# Patient Record
Sex: Female | Born: 1955
Health system: Southern US, Community
[De-identification: ages and names within clinical notes are randomized; demographics above are authoritative.]

## PROBLEM LIST (undated history)

## (undated) DIAGNOSIS — F419 Anxiety disorder, unspecified: Secondary | ICD-10-CM

## (undated) DIAGNOSIS — G8929 Other chronic pain: Secondary | ICD-10-CM

## (undated) DIAGNOSIS — I471 Supraventricular tachycardia, unspecified: Secondary | ICD-10-CM

## (undated) DIAGNOSIS — E739 Lactose intolerance, unspecified: Secondary | ICD-10-CM

## (undated) DIAGNOSIS — M79602 Pain in left arm: Secondary | ICD-10-CM

## (undated) DIAGNOSIS — R600 Localized edema: Secondary | ICD-10-CM

## (undated) DIAGNOSIS — F3181 Bipolar II disorder: Secondary | ICD-10-CM

## (undated) DIAGNOSIS — E669 Obesity, unspecified: Secondary | ICD-10-CM

## (undated) DIAGNOSIS — E785 Hyperlipidemia, unspecified: Secondary | ICD-10-CM

## (undated) DIAGNOSIS — Z9889 Other specified postprocedural states: Secondary | ICD-10-CM

## (undated) DIAGNOSIS — M2550 Pain in unspecified joint: Secondary | ICD-10-CM

## (undated) DIAGNOSIS — R918 Other nonspecific abnormal finding of lung field: Secondary | ICD-10-CM

## (undated) DIAGNOSIS — I2510 Atherosclerotic heart disease of native coronary artery without angina pectoris: Secondary | ICD-10-CM

## (undated) DIAGNOSIS — S0300XA Dislocation of jaw, unspecified side, initial encounter: Secondary | ICD-10-CM

## (undated) DIAGNOSIS — Z8639 Personal history of other endocrine, nutritional and metabolic disease: Secondary | ICD-10-CM

## (undated) DIAGNOSIS — K449 Diaphragmatic hernia without obstruction or gangrene: Secondary | ICD-10-CM

## (undated) DIAGNOSIS — G4733 Obstructive sleep apnea (adult) (pediatric): Secondary | ICD-10-CM

## (undated) DIAGNOSIS — Z860100 Personal history of colon polyps, unspecified: Secondary | ICD-10-CM

## (undated) DIAGNOSIS — R51 Headache: Secondary | ICD-10-CM

## (undated) DIAGNOSIS — G4730 Sleep apnea, unspecified: Secondary | ICD-10-CM

## (undated) DIAGNOSIS — F329 Major depressive disorder, single episode, unspecified: Secondary | ICD-10-CM

## (undated) DIAGNOSIS — F99 Mental disorder, not otherwise specified: Secondary | ICD-10-CM

## (undated) DIAGNOSIS — M1990 Unspecified osteoarthritis, unspecified site: Secondary | ICD-10-CM

## (undated) DIAGNOSIS — F32A Depression, unspecified: Secondary | ICD-10-CM

## (undated) DIAGNOSIS — G5600 Carpal tunnel syndrome, unspecified upper limb: Secondary | ICD-10-CM

## (undated) DIAGNOSIS — R413 Other amnesia: Secondary | ICD-10-CM

## (undated) DIAGNOSIS — E559 Vitamin D deficiency, unspecified: Secondary | ICD-10-CM

## (undated) DIAGNOSIS — I10 Essential (primary) hypertension: Secondary | ICD-10-CM

## (undated) DIAGNOSIS — R7303 Prediabetes: Secondary | ICD-10-CM

## (undated) DIAGNOSIS — G479 Sleep disorder, unspecified: Secondary | ICD-10-CM

## (undated) DIAGNOSIS — I8390 Asymptomatic varicose veins of unspecified lower extremity: Secondary | ICD-10-CM

## (undated) DIAGNOSIS — K219 Gastro-esophageal reflux disease without esophagitis: Secondary | ICD-10-CM

## (undated) DIAGNOSIS — R0602 Shortness of breath: Secondary | ICD-10-CM

## (undated) DIAGNOSIS — I724 Aneurysm of artery of lower extremity: Secondary | ICD-10-CM

## (undated) HISTORY — DX: Bipolar II disorder: F31.81

## (undated) HISTORY — DX: Carpal tunnel syndrome, unspecified upper limb: G56.00

## (undated) HISTORY — PX: JOINT REPLACEMENT: SHX530

## (undated) HISTORY — DX: Obesity, unspecified: E66.9

## (undated) HISTORY — DX: Vitamin D deficiency, unspecified: E55.9

## (undated) HISTORY — DX: Sleep disorder, unspecified: G47.9

## (undated) HISTORY — DX: Prediabetes: R73.03

## (undated) HISTORY — DX: Pain in unspecified joint: M25.50

## (undated) HISTORY — DX: Shortness of breath: R06.02

## (undated) HISTORY — DX: Localized edema: R60.0

## (undated) HISTORY — DX: Dislocation of jaw, unspecified side, initial encounter: S03.00XA

## (undated) HISTORY — DX: Diaphragmatic hernia without obstruction or gangrene: K44.9

## (undated) HISTORY — DX: Personal history of other endocrine, nutritional and metabolic disease: Z86.39

## (undated) HISTORY — DX: Aneurysm of artery of lower extremity: I72.4

## (undated) HISTORY — DX: Unspecified osteoarthritis, unspecified site: M19.90

## (undated) HISTORY — DX: Other chronic pain: G89.29

## (undated) HISTORY — PX: CYST REMOVAL HAND: SHX6279

## (undated) HISTORY — DX: Personal history of colon polyps, unspecified: Z86.0100

## (undated) HISTORY — DX: Lactose intolerance, unspecified: E73.9

## (undated) HISTORY — DX: Obstructive sleep apnea (adult) (pediatric): G47.33

## (undated) HISTORY — DX: Atherosclerotic heart disease of native coronary artery without angina pectoris: I25.10

## (undated) HISTORY — DX: Anxiety disorder, unspecified: F41.9

## (undated) HISTORY — DX: Other nonspecific abnormal finding of lung field: R91.8

## (undated) HISTORY — DX: Asymptomatic varicose veins of unspecified lower extremity: I83.90

## (undated) HISTORY — DX: Pain in left arm: M79.602

## (undated) HISTORY — DX: Personal history of colonic polyps: Z86.010

## (undated) HISTORY — DX: Other amnesia: R41.3

## (undated) HISTORY — DX: Gastro-esophageal reflux disease without esophagitis: K21.9

## (undated) HISTORY — DX: Hyperlipidemia, unspecified: E78.5

---

## 2000-10-26 ENCOUNTER — Encounter: Admission: RE | Admit: 2000-10-26 | Discharge: 2000-11-03 | Payer: Self-pay | Admitting: Internal Medicine

## 2000-11-09 ENCOUNTER — Other Ambulatory Visit: Admission: RE | Admit: 2000-11-09 | Discharge: 2000-11-09 | Payer: Self-pay | Admitting: Obstetrics and Gynecology

## 2001-02-01 ENCOUNTER — Ambulatory Visit (HOSPITAL_COMMUNITY): Admission: RE | Admit: 2001-02-01 | Discharge: 2001-02-01 | Payer: Self-pay | Admitting: *Deleted

## 2001-02-01 ENCOUNTER — Encounter: Payer: Self-pay | Admitting: Obstetrics and Gynecology

## 2002-02-06 ENCOUNTER — Ambulatory Visit (HOSPITAL_COMMUNITY): Admission: RE | Admit: 2002-02-06 | Discharge: 2002-02-06 | Payer: Self-pay | Admitting: Obstetrics and Gynecology

## 2002-02-06 ENCOUNTER — Other Ambulatory Visit: Admission: RE | Admit: 2002-02-06 | Discharge: 2002-02-06 | Payer: Self-pay | Admitting: Obstetrics and Gynecology

## 2002-02-06 ENCOUNTER — Encounter: Payer: Self-pay | Admitting: Obstetrics and Gynecology

## 2003-02-12 ENCOUNTER — Other Ambulatory Visit: Admission: RE | Admit: 2003-02-12 | Discharge: 2003-02-12 | Payer: Self-pay | Admitting: Obstetrics and Gynecology

## 2003-02-20 ENCOUNTER — Ambulatory Visit (HOSPITAL_COMMUNITY): Admission: RE | Admit: 2003-02-20 | Discharge: 2003-02-20 | Payer: Self-pay | Admitting: Obstetrics and Gynecology

## 2003-05-13 ENCOUNTER — Emergency Department (HOSPITAL_COMMUNITY): Admission: AD | Admit: 2003-05-13 | Discharge: 2003-05-13 | Payer: Self-pay | Admitting: Family Medicine

## 2004-10-29 ENCOUNTER — Emergency Department (HOSPITAL_COMMUNITY): Admission: EM | Admit: 2004-10-29 | Discharge: 2004-10-29 | Payer: Self-pay | Admitting: Emergency Medicine

## 2005-04-22 ENCOUNTER — Other Ambulatory Visit: Admission: RE | Admit: 2005-04-22 | Discharge: 2005-04-22 | Payer: Self-pay | Admitting: Obstetrics and Gynecology

## 2006-04-29 ENCOUNTER — Other Ambulatory Visit: Admission: RE | Admit: 2006-04-29 | Discharge: 2006-04-29 | Payer: Self-pay | Admitting: Obstetrics and Gynecology

## 2006-05-31 ENCOUNTER — Ambulatory Visit (HOSPITAL_COMMUNITY): Admission: RE | Admit: 2006-05-31 | Discharge: 2006-05-31 | Payer: Self-pay | Admitting: Family Medicine

## 2007-02-02 ENCOUNTER — Ambulatory Visit (HOSPITAL_COMMUNITY): Admission: RE | Admit: 2007-02-02 | Discharge: 2007-02-02 | Payer: Self-pay | Admitting: Gynecologic Oncology

## 2007-07-04 ENCOUNTER — Ambulatory Visit (HOSPITAL_COMMUNITY): Admission: RE | Admit: 2007-07-04 | Discharge: 2007-07-04 | Payer: Self-pay | Admitting: Gynecologic Oncology

## 2007-07-28 ENCOUNTER — Ambulatory Visit (HOSPITAL_COMMUNITY): Admission: RE | Admit: 2007-07-28 | Discharge: 2007-07-28 | Payer: Self-pay | Admitting: Family Medicine

## 2007-07-28 ENCOUNTER — Other Ambulatory Visit: Admission: RE | Admit: 2007-07-28 | Discharge: 2007-07-28 | Payer: Self-pay | Admitting: Obstetrics and Gynecology

## 2009-07-17 ENCOUNTER — Encounter (INDEPENDENT_AMBULATORY_CARE_PROVIDER_SITE_OTHER): Payer: Self-pay | Admitting: *Deleted

## 2010-04-27 ENCOUNTER — Encounter: Payer: Self-pay | Admitting: Obstetrics and Gynecology

## 2010-05-06 NOTE — Letter (Signed)
Summary: Referral - not able to see patient  [facility]  [address] [place], [place] [address]   Phone: [phone]  Fax: [phone]      July 17, 2009   [place] [facility] Gweneth Dimitri, M.D. [phone]. [address] [place], [place] [address]    Re:   Lauren Huffman DOB:  11/27/1955 MRN:   [id number]    Dear Dr. Corliss Blacker:  Thank you for your kind referral of the above patient.  We have attempted to schedule the recommended procedure Screening Colonoscopy but have not been able to schedule because:   X  The patient was not available by phone and/or has not returned our calls.  ___ The patient declined to schedule the procedure at this time.  We appreciate the referral and hope that we will have the opportunity to treat this patient in the future.    Sincerely,    [facility] [phone]

## 2010-08-19 NOTE — Consult Note (Signed)
NAME:  Lauren Huffman, Lauren Huffman                    ACCOUNT NO.:  [id number]   MEDICAL RECORD NO.:  [id number]          PATIENT TYPE:  OUT   LOCATION:  XRAY                         FACILITY:  [facility]   PHYSICIAN:  Paola A. Duard Brady, MD    DATE OF BIRTH:  1955/07/01   DATE OF CONSULTATION:  02/02/2007  DATE OF DISCHARGE:                                 CONSULTATION   REFERRING PHYSICIAN:  Dr. Gweneth Dimitri.   HISTORY:  The patient is seen today in consultation at the request of  Dr. Gweneth Dimitri.  Lauren Huffman is a 55 year old gravida 2, para 2 who has  been followed by Dr. Artist Pais for quite some time and comes in for  a second opinion regarding the need for a hysterectomy.  Dr. Thomasena Edis has  been seeing the patient since 2005 for intermittent ovarian cysts that  have from time to time gone from the left ovary to the right ovary.  Most recently in January 2008, she had a simple cyst with a septation  measuring 4.7 x 3.7 x 3.2 cm in size which was increased in size, but it  was otherwise avascular.  At that time, the patient had no other  symptoms of ovarian cancer including early satiety, increased abdominal  girth, gaseous changes and bloating.  She was seen by Dr. Madaline Guthrie at 4 size in [place] and it was felt that she would need  to undergo hysterectomy and further evaluation of this ovarian mass.  This made the patient feel somewhat uncomfortable and she declined  therapy at that time.  In addition because of her obesity issue, she has  been undergoing fairly regular endometrial biopsies by Dr. Thomasena Edis.  Most recently, she had an endometrial biopsy in February 2008 that  revealed fragmented weakly proliferative endometrium admixed with blood  and mucus.  The patient is not menopausal thus far as she had a  gonadotropin of 13.1 in January 2008.  The patient is oligomenorrhea  having approximately 2 periods a year, but she has been on Micronor  birth control pill which it appears she  was placed on to decrease her  risk of developing endometrial cancer.   Other issues the patient has is sleep apnea.  However, since she has  been able to lose weight approximately 30 pounds since January 2008 that  her issues with sleep apnea have decreased.  She has not been using her  sleep apnea machine in part because she has not felt that she has needed  it and secondly she needs a new machine as it is no longer fitting her  due to her significant weight loss efforts.  The issue is that she is  ready for discussion today is her confusion regarding whether or not she  needs surgery for either the ovarian cysts or her risk of endometrial  cancer.  She otherwise is doing quite well and denies any complaints.   REVIEW OF SYSTEMS:  She denies any chest pain, shortness of breath,  nausea, vomiting, fevers, chills.  There has been some stress in the  family as her father had been diagnosed with a parotid gland tumor and  she and her two sisters are trying to help him navigate that medical  system.   MEDICATIONS:  1. Micronor.  2. Multivitamins.  3. Claritin p.r.n.  4. Clonazepam.  5. Tylenol Arthritis p.r.n.  6. Meloxicam 15 mg daily.  7. Celexa 80 mg daily.  8. Wellbutrin 450 mg daily.  9. Vitamin D.   PAST SURGICAL HISTORY:  None.  She has had 2 spontaneous vaginal  deliveries.  She has a son who is 59 and a daughter who is 91.   ALLERGIES:  NONE.   HEALTH MAINTENANCE:  She is up-to-date on her mammograms.  She is due  for a colonoscopy, but is holding off on scheduling it for other  reasons.   SOCIAL HISTORY:  She denies history of tobacco or alcohol.  She is  married.  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  Her husband works in  Programme researcher, broadcasting/film/video.   FAMILY HISTORY:  Her father had parotid cancer at the age of 83.  She  has had 2 sisters who have had hysterectomies for noncancer related  issues.   PHYSICAL EXAMINATION:  VITAL SIGNS:  Weight 240 pounds, height 6 feet,  blood  pressure 126/86, pulse 80.  GENERAL:  Well-nourished, well-developed alert female in no acute  distress.  ABDOMEN:  Soft, nontender, nondistended.  There are no palpable masses  or hepatosplenomegaly.  Groins are negative for adenopathy.  PELVIC:  External genitalia is within normal limits.  The vagina is  somewhat atrophic.  The cervix is visualized with a physiologic  discharge.  There are no gross visible lesions.  Bimanual examination:  The cervix is palpably normal.  The corpus of normal size, shape and  consistency.  There are no adnexal masses.   DIAGNOSTICS:  Ultrasound revealed fundal fibroid measuring approximately  5 cm.  She has a right ovarian cyst which is 2 x 2 cm and is simple in  appearance.  The left ovary appears normal with small follicles.  There  is no free fluid.  Her endometrial stripe thickness is 0.5 cm.   ASSESSMENT:  A 55 year old with intermittent ovarian cysts that seem to  go from one side to the other.  She was referred to Dr. Clifton James in April  2008 secondary to an enlarging left ovarian cyst with a septation which  has subsequently resolved.  At that time, she also had a normal  C125.  The patient was recommended to undergo surgery at that time  including hysterectomy and oophorectomy which she was not sure that she  needed and came in today for second opinion.  At this point based on the  ultrasound finding that we currently have, I do not believe the patient  needs to proceed with definitive surgery as this is a 2 cm simple  ovarian cyst in a premenopausal woman based on gonadotropins.  She does  have  irregular bleeding.  It is hard to ascertain how much of that is  due to her Micronor contraceptive pills versus her obesity leading her  to be oligomenorrheic.  She has had normal biopsies with no suggestion  of hyperplasia and I do not feel that we necessarily need to proceed  with hysterectomy at that time for this.  As it stands, she would like  to  proceed with a repeat ultrasound in 8 weeks.  If at that time the  ovarian cyst is improved,  she will  continue expectant management with  her primary physicians.  If at any point either the mass gets worse or  the bleeding pattern changes significantly,  she understands the need for repeat biopsies and new evaluation.  Her  questions were elicited and answered with her to her satisfaction.  She  is pleased with her consultation and the ultrasound will be scheduled.  We will communicate these findings and discussion with Dr. Corliss Blacker and  Dr. Thomasena Edis.      Paola A. Duard Brady, MD  Electronically Signed     PAG/MEDQ  D:  02/02/2007  T:  02/03/2007  Job:  [id number]   cc:   Pam Drown, M.D.  Fax: [phone]   Artist Pais, M.D.  Fax: [phone]   Telford Nab, R.N.  [address]  [place], [place] [address]

## 2011-04-13 ENCOUNTER — Other Ambulatory Visit: Payer: Self-pay | Admitting: Family Medicine

## 2011-04-13 ENCOUNTER — Other Ambulatory Visit (HOSPITAL_COMMUNITY)
Admission: RE | Admit: 2011-04-13 | Discharge: 2011-04-13 | Disposition: A | Discharge status: Home / Self Care | Payer: 59 | Source: Ambulatory Visit | Attending: Family Medicine | Admitting: Family Medicine

## 2011-04-13 DIAGNOSIS — Z124 Encounter for screening for malignant neoplasm of cervix: Secondary | ICD-10-CM | POA: Insufficient documentation

## 2011-04-13 DIAGNOSIS — Z1159 Encounter for screening for other viral diseases: Secondary | ICD-10-CM | POA: Insufficient documentation

## 2011-11-27 ENCOUNTER — Other Ambulatory Visit: Payer: Self-pay | Admitting: Orthopedic Surgery

## 2011-12-16 ENCOUNTER — Encounter (HOSPITAL_COMMUNITY): Payer: Self-pay | Admitting: Pharmacy Technician

## 2011-12-17 ENCOUNTER — Encounter (HOSPITAL_COMMUNITY)
Admission: RE | Admit: 2011-12-17 | Discharge: 2011-12-17 | Disposition: A | Discharge status: Home / Self Care | Payer: No Typology Code available for payment source | Source: Ambulatory Visit | Attending: Orthopedic Surgery | Admitting: Orthopedic Surgery

## 2011-12-17 ENCOUNTER — Encounter (HOSPITAL_COMMUNITY): Payer: Self-pay

## 2011-12-17 ENCOUNTER — Ambulatory Visit (HOSPITAL_COMMUNITY)
Admission: RE | Admit: 2011-12-17 | Discharge: 2011-12-17 | Disposition: A | Discharge status: Home / Self Care | Payer: No Typology Code available for payment source | Source: Ambulatory Visit | Attending: Orthopedic Surgery | Admitting: Orthopedic Surgery

## 2011-12-17 DIAGNOSIS — Z01818 Encounter for other preprocedural examination: Secondary | ICD-10-CM | POA: Insufficient documentation

## 2011-12-17 DIAGNOSIS — Z01812 Encounter for preprocedural laboratory examination: Secondary | ICD-10-CM | POA: Insufficient documentation

## 2011-12-17 DIAGNOSIS — Z01810 Encounter for preprocedural cardiovascular examination: Secondary | ICD-10-CM | POA: Insufficient documentation

## 2011-12-17 HISTORY — DX: Depression, unspecified: F32.A

## 2011-12-17 HISTORY — DX: Headache: R51

## 2011-12-17 HISTORY — DX: Mental disorder, not otherwise specified: F99

## 2011-12-17 HISTORY — DX: Major depressive disorder, single episode, unspecified: F32.9

## 2011-12-17 HISTORY — DX: Sleep apnea, unspecified: G47.30

## 2011-12-17 LAB — CBC WITH DIFFERENTIAL/PLATELET
Eosinophils Absolute: 0.2 10*3/uL (ref 0.0–0.7)
Eosinophils Relative: 2 % (ref 0–5)
HCT: 41 % (ref 36.0–46.0)
Lymphocytes Relative: 20 % (ref 12–46)
Lymphs Abs: 2.2 10*3/uL (ref 0.7–4.0)
MCHC: 33.2 g/dL (ref 30.0–36.0)
Monocytes Relative: 6 % (ref 3–12)
Neutro Abs: 7.8 10*3/uL — ABNORMAL HIGH (ref 1.7–7.7)
Neutrophils Relative %: 72 % (ref 43–77)
RDW: 14.4 % (ref 11.5–15.5)

## 2011-12-17 LAB — COMPREHENSIVE METABOLIC PANEL
ALT: 16 U/L (ref 0–35)
Alkaline Phosphatase: 86 U/L (ref 39–117)
BUN: 19 mg/dL (ref 6–23)
CO2: 26 mEq/L (ref 19–32)
Calcium: 9.4 mg/dL (ref 8.4–10.5)
GFR calc Af Amer: >90 mL/min (ref >90)
GFR calc non Af Amer: >90 mL/min (ref >90)
Glucose, Bld: 101 mg/dL — ABNORMAL HIGH (ref 70–99)
Potassium: 4.1 mEq/L (ref 3.5–5.1)
Sodium: 141 mEq/L (ref 135–145)

## 2011-12-17 LAB — URINALYSIS, ROUTINE W REFLEX MICROSCOPIC
Bilirubin Urine: NEGATIVE
Glucose, UA: NEGATIVE mg/dL
Hgb urine dipstick: NEGATIVE
Specific Gravity, Urine: 1.008 (ref 1.005–1.030)

## 2011-12-17 LAB — APTT: aPTT: 25 seconds (ref 24–37)

## 2011-12-17 LAB — URINE MICROSCOPIC-ADD ON

## 2011-12-17 LAB — SURGICAL PCR SCREEN
MRSA, PCR: NEGATIVE
Staphylococcus aureus: POSITIVE — AB

## 2011-12-17 LAB — PROTIME-INR: Prothrombin Time: 12.9 seconds (ref 11.6–15.2)

## 2011-12-17 LAB — ABO/RH: ABO/RH(D): O POS

## 2011-12-17 NOTE — Pre-Procedure Instructions (Signed)
20 Lauren Huffman  12/17/2011   Your procedure is scheduled on: Friday 12/25/11   Report to [facility] at 530 AM.  Call this number if you have problems the morning of surgery: [phone]   Remember:   Do not eat food OR DRINK :After Midnight.    Take these medicines the morning of surgery with A SIP OF WATER: TYLENOL, WELLBUTRIN, CELEXA   Do not wear jewelry, make-up or nail polish.  Do not wear lotions, powders, or perfumes. You may wear deodorant.  Do not shave 48 hours prior to surgery. Men may shave face and neck.  Do not bring valuables to the hospital.  Contacts, dentures or bridgework may not be worn into surgery.  Leave suitcase in the car. After surgery it may be brought to your room.  For patients admitted to the hospital, checkout time is 11:00 AM the day of discharge.   Patients discharged the day of surgery will not be allowed to drive home.  Name and phone number of your driver:   Special Instructions: CHG Shower Use Special Wash: 1/2 bottle night before surgery and 1/2 bottle morning of surgery.   Please read over the following fact sheets that you were given: Pain Booklet, Coughing and Deep Breathing, Blood Transfusion Information, MRSA Information and Surgical Site Infection Prevention

## 2011-12-17 NOTE — Progress Notes (Signed)
[facility]ED [facility].

## 2011-12-18 NOTE — Consult Note (Signed)
Anesthesia chart review: Patient is a 56 year old female scheduled for right total knee replacement by Dr. Luiz Blare on 12/25/2011. History includes obesity, nonsmoker, obstructive sleep apnea without CPAP use, headaches, depression.    Labs noted.  UA shows moderate leukocytes, negative nitrites.  WBC 10.9.  UA results called to Darl Pikes at Dr. Luiz Blare' office.  Chest x-ray on 12/17/2011 showed no active disease. Mild degenerative changes of the midthoracic spine.    EKG on 12/17/11 showed NSR, incomplete right BBB, cannot rule out anterior infarct (age undetermined).  Currently there are no comparison EKGs available.  No CV symptoms were documented at her PAT visit.  She has no known history of MI, CHF, or DM.  She has no known history of HTN, although her diastolic BP was elevated at her PAT visit  (BP 120/90).  Clinical correlation on the day of surgery.  If remains asymptomatic then anticipate she can proceed as planned.  Shonna Chock, PA-C

## 2011-12-21 NOTE — Progress Notes (Signed)
Sleep study requested again from [facility] sleep center.

## 2011-12-24 MED ORDER — DEXTROSE 5 % IV SOLN
3.0000 g | INTRAVENOUS | Status: AC
  Administered 2011-12-25: 3 g via INTRAVENOUS
  Filled 2011-12-24: qty 3000

## 2011-12-24 MED ORDER — POVIDONE-IODINE 7.5 % EX SOLN
Freq: Once | CUTANEOUS | Status: DC
  Filled 2011-12-24: qty 118

## 2011-12-25 ENCOUNTER — Encounter (HOSPITAL_COMMUNITY): Payer: Self-pay | Admitting: Vascular Surgery

## 2011-12-25 ENCOUNTER — Inpatient Hospital Stay (HOSPITAL_COMMUNITY)
Admission: RE | Admit: 2011-12-25 | Discharge: 2011-12-29 | DRG: 470 | Disposition: A | Discharge status: Skilled Nursing Facility | Payer: No Typology Code available for payment source | Source: Ambulatory Visit | Attending: Orthopedic Surgery | Admitting: Orthopedic Surgery

## 2011-12-25 ENCOUNTER — Encounter (HOSPITAL_COMMUNITY)
Admission: RE | Disposition: A | Discharge status: Skilled Nursing Facility | Payer: Self-pay | Source: Ambulatory Visit | Attending: Orthopedic Surgery

## 2011-12-25 ENCOUNTER — Ambulatory Visit (HOSPITAL_COMMUNITY): Payer: No Typology Code available for payment source | Admitting: Vascular Surgery

## 2011-12-25 ENCOUNTER — Encounter (HOSPITAL_COMMUNITY): Payer: Self-pay | Admitting: *Deleted

## 2011-12-25 DIAGNOSIS — Y92009 Unspecified place in unspecified non-institutional (private) residence as the place of occurrence of the external cause: Secondary | ICD-10-CM

## 2011-12-25 DIAGNOSIS — S83106A Unspecified dislocation of unspecified knee, initial encounter: Secondary | ICD-10-CM | POA: Diagnosis present

## 2011-12-25 DIAGNOSIS — X58XXXA Exposure to other specified factors, initial encounter: Secondary | ICD-10-CM | POA: Diagnosis present

## 2011-12-25 DIAGNOSIS — F3289 Other specified depressive episodes: Secondary | ICD-10-CM | POA: Diagnosis present

## 2011-12-25 DIAGNOSIS — M1711 Unilateral primary osteoarthritis, right knee: Secondary | ICD-10-CM | POA: Diagnosis present

## 2011-12-25 DIAGNOSIS — Y998 Other external cause status: Secondary | ICD-10-CM

## 2011-12-25 DIAGNOSIS — M1710 Unilateral primary osteoarthritis, unspecified knee: Principal | ICD-10-CM | POA: Diagnosis present

## 2011-12-25 DIAGNOSIS — Z6838 Body mass index (BMI) 38.0-38.9, adult: Secondary | ICD-10-CM

## 2011-12-25 DIAGNOSIS — G4730 Sleep apnea, unspecified: Secondary | ICD-10-CM | POA: Diagnosis present

## 2011-12-25 DIAGNOSIS — F329 Major depressive disorder, single episode, unspecified: Secondary | ICD-10-CM | POA: Diagnosis present

## 2011-12-25 DIAGNOSIS — E669 Obesity, unspecified: Secondary | ICD-10-CM | POA: Diagnosis present

## 2011-12-25 HISTORY — PX: KNEE ARTHROPLASTY: SHX992

## 2011-12-25 SURGERY — ARTHROPLASTY, KNEE, TOTAL, USING IMAGELESS COMPUTER-ASSISTED NAVIGATION
Anesthesia: General | Site: Knee | Laterality: Right | Wound class: Clean

## 2011-12-25 MED ORDER — ACETAMINOPHEN 10 MG/ML IV SOLN
1000.0000 mg | Freq: Four times a day (QID) | INTRAVENOUS | Status: AC
  Administered 2011-12-25 – 2011-12-26 (×3): 1000 mg via INTRAVENOUS
  Filled 2011-12-25 (×4): qty 100

## 2011-12-25 MED ORDER — COUMADIN BOOK
Freq: Once | Status: DC
  Filled 2011-12-25: qty 1

## 2011-12-25 MED ORDER — GLYCOPYRROLATE 0.2 MG/ML IJ SOLN
INTRAMUSCULAR | Status: DC | PRN
  Administered 2011-12-25: 0.4 mg via INTRAVENOUS

## 2011-12-25 MED ORDER — FERROUS SULFATE 325 (65 FE) MG PO TABS
325.0000 mg | ORAL_TABLET | Freq: Two times a day (BID) | ORAL | Status: DC
  Administered 2011-12-25 – 2011-12-29 (×8): 325 mg via ORAL
  Filled 2011-12-25 (×10): qty 1

## 2011-12-25 MED ORDER — ONDANSETRON HCL 4 MG PO TABS: 4.0000 mg | ORAL_TABLET | Freq: Four times a day (QID) | ORAL | Status: DC | PRN

## 2011-12-25 MED ORDER — FENTANYL CITRATE 0.05 MG/ML IJ SOLN
INTRAMUSCULAR | Status: DC | PRN
  Administered 2011-12-25 (×3): 50 ug via INTRAVENOUS
  Administered 2011-12-25: 100 ug via INTRAVENOUS
  Administered 2011-12-25 (×4): 50 ug via INTRAVENOUS

## 2011-12-25 MED ORDER — EPHEDRINE SULFATE 50 MG/ML IJ SOLN
INTRAMUSCULAR | Status: DC | PRN
  Administered 2011-12-25: 5 mg via INTRAVENOUS

## 2011-12-25 MED ORDER — BUPIVACAINE-EPINEPHRINE PF 0.5-1:200000 % IJ SOLN
INTRAMUSCULAR | Status: DC | PRN
  Administered 2011-12-25: 30 mL

## 2011-12-25 MED ORDER — CEFAZOLIN SODIUM-DEXTROSE 2-3 GM-% IV SOLR
2.0000 g | Freq: Four times a day (QID) | INTRAVENOUS | Status: AC
  Administered 2011-12-25 (×2): 2 g via INTRAVENOUS
  Filled 2011-12-25 (×2): qty 50

## 2011-12-25 MED ORDER — DEXAMETHASONE SODIUM PHOSPHATE 4 MG/ML IJ SOLN
INTRAMUSCULAR | Status: DC | PRN
  Administered 2011-12-25: 4 mg via INTRAVENOUS

## 2011-12-25 MED ORDER — FOLIC ACID 1 MG PO TABS
2.0000 mg | ORAL_TABLET | Freq: Every morning | ORAL | Status: DC
  Administered 2011-12-25 – 2011-12-29 (×5): 2 mg via ORAL
  Filled 2011-12-25 (×5): qty 2

## 2011-12-25 MED ORDER — ONDANSETRON HCL 4 MG/2ML IJ SOLN: 4.0000 mg | Freq: Once | INTRAMUSCULAR | Status: DC | PRN

## 2011-12-25 MED ORDER — VITAMIN B-12 1000 MCG SL SUBL: 1000.0000 ug | SUBLINGUAL_TABLET | Freq: Every day | SUBLINGUAL | Status: DC

## 2011-12-25 MED ORDER — PHENYLEPHRINE HCL 10 MG/ML IJ SOLN
INTRAMUSCULAR | Status: DC | PRN
  Administered 2011-12-25 (×2): 40 ug via INTRAVENOUS

## 2011-12-25 MED ORDER — LIDOCAINE HCL 4 % MT SOLN
OROMUCOSAL | Status: DC | PRN
  Administered 2011-12-25: 4 mL via TOPICAL

## 2011-12-25 MED ORDER — NEOSTIGMINE METHYLSULFATE 1 MG/ML IJ SOLN
INTRAMUSCULAR | Status: DC | PRN
  Administered 2011-12-25: 3 mg via INTRAVENOUS

## 2011-12-25 MED ORDER — CITALOPRAM HYDROBROMIDE 40 MG PO TABS
40.0000 mg | ORAL_TABLET | Freq: Every morning | ORAL | Status: DC
  Administered 2011-12-26 – 2011-12-29 (×4): 40 mg via ORAL
  Filled 2011-12-25 (×4): qty 1

## 2011-12-25 MED ORDER — LIDOCAINE HCL (CARDIAC) 20 MG/ML IV SOLN
INTRAVENOUS | Status: DC | PRN
  Administered 2011-12-25: 80 mg via INTRAVENOUS

## 2011-12-25 MED ORDER — HYDROMORPHONE HCL PF 1 MG/ML IJ SOLN
1.0000 mg | INTRAMUSCULAR | Status: DC | PRN
  Administered 2011-12-25 (×2): 1 mg via INTRAVENOUS
  Filled 2011-12-25 (×2): qty 1

## 2011-12-25 MED ORDER — CEFUROXIME SODIUM 1.5 G IJ SOLR
INTRAMUSCULAR | Status: DC | PRN
  Administered 2011-12-25: 1.5 g

## 2011-12-25 MED ORDER — PROPOFOL 10 MG/ML IV BOLUS
INTRAVENOUS | Status: DC | PRN
  Administered 2011-12-25: 200 mg via INTRAVENOUS

## 2011-12-25 MED ORDER — OXYCODONE HCL 5 MG PO TABS
5.0000 mg | ORAL_TABLET | ORAL | Status: DC | PRN
  Administered 2011-12-25 – 2011-12-26 (×2): 5 mg via ORAL
  Administered 2011-12-26 (×2): 10 mg via ORAL
  Administered 2011-12-26 (×3): 5 mg via ORAL
  Administered 2011-12-27 – 2011-12-29 (×13): 10 mg via ORAL
  Filled 2011-12-25 (×8): qty 2
  Filled 2011-12-25: qty 1
  Filled 2011-12-25 (×2): qty 2
  Filled 2011-12-25: qty 1
  Filled 2011-12-25: qty 2
  Filled 2011-12-25 (×2): qty 1
  Filled 2011-12-25 (×3): qty 2
  Filled 2011-12-25: qty 1
  Filled 2011-12-25: qty 2

## 2011-12-25 MED ORDER — KETOROLAC TROMETHAMINE 30 MG/ML IJ SOLN
INTRAMUSCULAR | Status: DC | PRN
  Administered 2011-12-25 (×2): 15 mg via INTRAVENOUS

## 2011-12-25 MED ORDER — WARFARIN - PHARMACIST DOSING INPATIENT: Freq: Every day | Status: DC

## 2011-12-25 MED ORDER — POLYETHYLENE GLYCOL 3350 17 G PO PACK
17.0000 g | PACK | Freq: Every day | ORAL | Status: DC | PRN
  Administered 2011-12-28: 17 g via ORAL
  Filled 2011-12-25: qty 1

## 2011-12-25 MED ORDER — ROCURONIUM BROMIDE 100 MG/10ML IV SOLN
INTRAVENOUS | Status: DC | PRN
  Administered 2011-12-25: 50 mg via INTRAVENOUS
  Administered 2011-12-25: 20 mg via INTRAVENOUS

## 2011-12-25 MED ORDER — WARFARIN SODIUM 7.5 MG PO TABS
7.5000 mg | ORAL_TABLET | Freq: Once | ORAL | Status: AC
  Administered 2011-12-25: 7.5 mg via ORAL
  Filled 2011-12-25: qty 1

## 2011-12-25 MED ORDER — METHOCARBAMOL 500 MG PO TABS
500.0000 mg | ORAL_TABLET | Freq: Four times a day (QID) | ORAL | Status: DC | PRN
  Administered 2011-12-25 – 2011-12-29 (×12): 500 mg via ORAL
  Filled 2011-12-25 (×14): qty 1

## 2011-12-25 MED ORDER — HYDROMORPHONE HCL PF 1 MG/ML IJ SOLN
INTRAMUSCULAR | Status: AC
  Filled 2011-12-25: qty 1

## 2011-12-25 MED ORDER — SODIUM CHLORIDE 0.9 % IR SOLN
Status: DC | PRN
  Administered 2011-12-25: 3000 mL

## 2011-12-25 MED ORDER — MIDAZOLAM HCL 5 MG/5ML IJ SOLN
INTRAMUSCULAR | Status: DC | PRN
  Administered 2011-12-25: 2 mg via INTRAVENOUS

## 2011-12-25 MED ORDER — ONDANSETRON HCL 4 MG/2ML IJ SOLN
INTRAMUSCULAR | Status: DC | PRN
  Administered 2011-12-25: 4 mg via INTRAVENOUS

## 2011-12-25 MED ORDER — OXYCODONE HCL 5 MG/5ML PO SOLN: 5.0000 mg | Freq: Once | ORAL | Status: DC | PRN

## 2011-12-25 MED ORDER — ALUM & MAG HYDROXIDE-SIMETH 200-200-20 MG/5ML PO SUSP: 30.0000 mL | ORAL | Status: DC | PRN

## 2011-12-25 MED ORDER — CEFUROXIME SODIUM 1.5 G IJ SOLR
INTRAMUSCULAR | Status: AC
  Filled 2011-12-25: qty 1.5

## 2011-12-25 MED ORDER — OXYCODONE HCL 5 MG PO TABS: 5.0000 mg | ORAL_TABLET | Freq: Once | ORAL | Status: DC | PRN

## 2011-12-25 MED ORDER — METHOCARBAMOL 100 MG/ML IJ SOLN
500.0000 mg | Freq: Four times a day (QID) | INTRAVENOUS | Status: DC | PRN
  Administered 2011-12-25: 500 mg via INTRAVENOUS
  Filled 2011-12-25: qty 5

## 2011-12-25 MED ORDER — WARFARIN VIDEO: Freq: Once | Status: DC

## 2011-12-25 MED ORDER — VITAMIN B-12 1000 MCG PO TABS
1000.0000 ug | ORAL_TABLET | Freq: Every day | ORAL | Status: DC
  Administered 2011-12-25 – 2011-12-29 (×5): 1000 ug via ORAL
  Filled 2011-12-25 (×5): qty 1

## 2011-12-25 MED ORDER — ONDANSETRON HCL 4 MG/2ML IJ SOLN: 4.0000 mg | Freq: Four times a day (QID) | INTRAMUSCULAR | Status: DC | PRN

## 2011-12-25 MED ORDER — LORATADINE 10 MG PO TABS
10.0000 mg | ORAL_TABLET | Freq: Every day | ORAL | Status: DC
  Administered 2011-12-25 – 2011-12-29 (×5): 10 mg via ORAL
  Filled 2011-12-25 (×5): qty 1

## 2011-12-25 MED ORDER — VITAMIN D 1000 UNITS PO TABS
2000.0000 [IU] | ORAL_TABLET | Freq: Every day | ORAL | Status: DC
  Administered 2011-12-25 – 2011-12-29 (×5): 2000 [IU] via ORAL
  Filled 2011-12-25 (×5): qty 2

## 2011-12-25 MED ORDER — DEXTROSE-NACL 5-0.45 % IV SOLN
INTRAVENOUS | Status: DC
  Administered 2011-12-25: 18:00:00 via INTRAVENOUS

## 2011-12-25 MED ORDER — BUPROPION HCL ER (XL) 300 MG PO TB24
450.0000 mg | ORAL_TABLET | Freq: Every morning | ORAL | Status: DC
  Administered 2011-12-26 – 2011-12-29 (×4): 450 mg via ORAL
  Filled 2011-12-25 (×4): qty 1

## 2011-12-25 MED ORDER — HYDROMORPHONE HCL PF 1 MG/ML IJ SOLN
0.2500 mg | INTRAMUSCULAR | Status: DC | PRN
  Administered 2011-12-25 (×2): 0.5 mg via INTRAVENOUS

## 2011-12-25 MED ORDER — DOCUSATE SODIUM 100 MG PO CAPS
100.0000 mg | ORAL_CAPSULE | Freq: Two times a day (BID) | ORAL | Status: DC
  Administered 2011-12-25 – 2011-12-29 (×8): 100 mg via ORAL
  Filled 2011-12-25 (×11): qty 1

## 2011-12-25 MED ORDER — KETOROLAC TROMETHAMINE 15 MG/ML IJ SOLN
7.5000 mg | Freq: Four times a day (QID) | INTRAMUSCULAR | Status: AC
  Administered 2011-12-25 – 2011-12-26 (×3): 7.5 mg via INTRAVENOUS
  Filled 2011-12-25 (×4): qty 1

## 2011-12-25 MED ORDER — LACTATED RINGERS IV SOLN
INTRAVENOUS | Status: DC | PRN
  Administered 2011-12-25 (×3): via INTRAVENOUS

## 2011-12-25 SURGICAL SUPPLY — 64 items
APL SKNCLS STERI-STRIP NONHPOA (GAUZE/BANDAGES/DRESSINGS) ×1
BANDAGE ESMARK 6X9 LF (GAUZE/BANDAGES/DRESSINGS) ×1 IMPLANT
BENZOIN TINCTURE PRP APPL 2/3 (GAUZE/BANDAGES/DRESSINGS) ×2 IMPLANT
BLADE SAGITTAL 25.0X1.19X90 (BLADE) ×2 IMPLANT
BLADE SAW SAG 90X13X1.27 (BLADE) ×2 IMPLANT
BNDG CMPR 9X6 STRL LF SNTH (GAUZE/BANDAGES/DRESSINGS) ×1
BNDG ESMARK 6X9 LF (GAUZE/BANDAGES/DRESSINGS) ×2
BOWL SMART MIX CTS (DISPOSABLE) ×2 IMPLANT
CEMENT HV SMART SET (Cement) ×4 IMPLANT
CLOTH BEACON ORANGE TIMEOUT ST (SAFETY) ×2 IMPLANT
COVER BACK TABLE 24X17X13 BIG (DRAPES) IMPLANT
COVER SURGICAL LIGHT HANDLE (MISCELLANEOUS) ×2 IMPLANT
CUFF TOURNIQUET SINGLE 34IN LL (TOURNIQUET CUFF) ×2 IMPLANT
CUFF TOURNIQUET SINGLE 44IN (TOURNIQUET CUFF) IMPLANT
DRAPE EXTREMITY T 121X128X90 (DRAPE) ×2 IMPLANT
DRAPE U-SHAPE 47X51 STRL (DRAPES) ×2 IMPLANT
DRSG PAD ABDOMINAL 8X10 ST (GAUZE/BANDAGES/DRESSINGS) ×2 IMPLANT
DURAPREP 26ML APPLICATOR (WOUND CARE) ×2 IMPLANT
ELECT REM PT RETURN 9FT ADLT (ELECTROSURGICAL) ×2
ELECTRODE REM PT RTRN 9FT ADLT (ELECTROSURGICAL) ×1 IMPLANT
EVACUATOR 1/8 PVC DRAIN (DRAIN) ×2 IMPLANT
FACESHIELD LNG OPTICON STERILE (SAFETY) ×2 IMPLANT
GAUZE XEROFORM 5X9 LF (GAUZE/BANDAGES/DRESSINGS) ×2 IMPLANT
GLOVE BIOGEL PI IND STRL 8 (GLOVE) ×2 IMPLANT
GLOVE BIOGEL PI INDICATOR 8 (GLOVE) ×2
GLOVE ECLIPSE 7.5 STRL STRAW (GLOVE) ×4 IMPLANT
GOWN PREVENTION PLUS XLARGE (GOWN DISPOSABLE) ×2 IMPLANT
GOWN SRG XL XLNG 56XLVL 4 (GOWN DISPOSABLE) ×1 IMPLANT
GOWN STRL NON-REIN LRG LVL3 (GOWN DISPOSABLE) ×2 IMPLANT
GOWN STRL NON-REIN XL XLG LVL4 (GOWN DISPOSABLE) ×2
HANDPIECE INTERPULSE COAX TIP (DISPOSABLE) ×2
HOOD PEEL AWAY FACE SHEILD DIS (HOOD) ×6 IMPLANT
IMMOBILIZER KNEE 20 (SOFTGOODS)
IMMOBILIZER KNEE 20 THIGH 36 (SOFTGOODS) IMPLANT
IMMOBILIZER KNEE 22 UNIV (SOFTGOODS) ×2 IMPLANT
IMMOBILIZER KNEE 24 THIGH 36 (MISCELLANEOUS) IMPLANT
IMMOBILIZER KNEE 24 UNIV (MISCELLANEOUS)
KIT BASIN OR (CUSTOM PROCEDURE TRAY) ×2 IMPLANT
KIT ROOM TURNOVER OR (KITS) ×2 IMPLANT
MANIFOLD NEPTUNE II (INSTRUMENTS) ×2 IMPLANT
MARKER SPHERE PSV REFLC THRD 5 (MARKER) ×6 IMPLANT
NEEDLE HYPO 25GX1X1/2 BEV (NEEDLE) IMPLANT
NS IRRIG 1000ML POUR BTL (IV SOLUTION) ×2 IMPLANT
PACK TOTAL JOINT (CUSTOM PROCEDURE TRAY) ×2 IMPLANT
PAD ARMBOARD 7.5X6 YLW CONV (MISCELLANEOUS) ×4 IMPLANT
PAD CAST 4YDX4 CTTN HI CHSV (CAST SUPPLIES) ×1 IMPLANT
PADDING CAST COTTON 4X4 STRL (CAST SUPPLIES) ×2
PIN SCHANZ 4MM 130MM (PIN) ×8 IMPLANT
SET HNDPC FAN SPRY TIP SCT (DISPOSABLE) ×1 IMPLANT
SPONGE GAUZE 4X4 12PLY (GAUZE/BANDAGES/DRESSINGS) ×2 IMPLANT
STAPLER VISISTAT 35W (STAPLE) IMPLANT
STRIP CLOSURE SKIN 1/2X4 (GAUZE/BANDAGES/DRESSINGS) ×2 IMPLANT
SUCTION FRAZIER TIP 10 FR DISP (SUCTIONS) ×2 IMPLANT
SUT MON AB 3-0 SH 27 (SUTURE)
SUT MON AB 3-0 SH27 (SUTURE) IMPLANT
SUT VIC AB 0 CTB1 27 (SUTURE) ×4 IMPLANT
SUT VIC AB 1 CT1 27 (SUTURE) ×4
SUT VIC AB 1 CT1 27XBRD ANBCTR (SUTURE) ×2 IMPLANT
SUT VIC AB 2-0 CTB1 (SUTURE) ×4 IMPLANT
SYR CONTROL 10ML LL (SYRINGE) IMPLANT
TOWEL OR 17X24 6PK STRL BLUE (TOWEL DISPOSABLE) ×2 IMPLANT
TOWEL OR 17X26 10 PK STRL BLUE (TOWEL DISPOSABLE) ×2 IMPLANT
TRAY FOLEY CATH 14FR (SET/KITS/TRAYS/PACK) ×2 IMPLANT
WATER STERILE IRR 1000ML POUR (IV SOLUTION) ×6 IMPLANT

## 2011-12-25 NOTE — Brief Op Note (Signed)
12/25/2011  10:14 AM  PATIENT:  Lauren Huffman  56 y.o. female  PRE-OPERATIVE DIAGNOSIS:  Degenerative joint disease  POST-OPERATIVE DIAGNOSIS:  Degenerative joint disease  PROCEDURE:  Procedure(s) (LRB) with comments: COMPUTER ASSISTED TOTAL KNEE ARTHROPLASTY (Right) - Right total knee replacement, general anesthesia, femoral nerve block  SURGEON:  Surgeon(s) and Role:    * [name], MD - Primary  PHYSICIAN ASSISTANT:   ASSISTANTS: bethune   ANESTHESIA:   general  EBL:  Total I/O In: 2000 [I.V.:2000] Out: 150 [Urine:150]  BLOOD ADMINISTERED:none  DRAINS: (1) Hemovact drain(s) in the r.knee with  Suction Open   LOCAL MEDICATIONS USED:  MARCAINE     SPECIMEN:  No Specimen  DISPOSITION OF SPECIMEN:  N/A  COUNTS:  YES  TOURNIQUET:   Total Tourniquet Time Documented: Thigh (Right) - 95 minutes  DICTATION: .Other Dictation: Dictation Number [id number]  PLAN OF CARE: Admit to inpatient   PATIENT DISPOSITION:  PACU - hemodynamically stable.   Delay start of Pharmacological VTE agent (>24hrs) due to surgical blood loss or risk of bleeding: no

## 2011-12-25 NOTE — Anesthesia Procedure Notes (Addendum)
Procedure Name: Intubation Date/Time: 12/25/2011 7:55 AM Performed by: Lovie Chol Pre-anesthesia Checklist: Patient identified, Emergency Drugs available, Suction available, Patient being monitored and Timeout performed Patient Re-evaluated:Patient Re-evaluated prior to inductionOxygen Delivery Method: Circle system utilized Preoxygenation: Pre-oxygenation with 100% oxygen Intubation Type: IV induction Ventilation: Mask ventilation without difficulty and Oral airway inserted - appropriate to patient size Laryngoscope Size: Miller and 2 Grade View: Grade II Tube type: Oral Tube size: 7.5 mm Number of attempts: 1 Airway Equipment and Method: Stylet and LTA kit utilized Placement Confirmation: ETT inserted through vocal cords under direct vision,  positive ETCO2 and breath sounds checked- equal and bilateral Secured at: 21 cm Tube secured with: Tape Dental Injury: Teeth and Oropharynx as per pre-operative assessment    Anesthesia Regional Block:  Femoral nerve block  Pre-Anesthetic Checklist: ,, timeout performed, Correct Patient, Correct Site, Correct Laterality, Correct Procedure, Correct Position, site marked, Risks and benefits discussed,  Surgical consent,  Pre-op evaluation,  At surgeon's request and post-op pain management  Laterality: Right and Lower  Prep: chloraprep       Needles:  Injection technique: Single-shot  Needle Type: Echogenic Needle     Needle Length: 9cm  Needle Gauge: 21    Additional Needles:  Procedures: ultrasound guided Femoral nerve block Narrative:  Start time: 12/25/2011 7:12 AM End time: 12/25/2011 7:20 AM Injection made incrementally with aspirations every 5 mL.  Performed by: Personally  Anesthesiologist: Sheldon Silvan, MD  Additional Notes: Marcaine 0.5% with EPI 1:200000  Femoral nerve block

## 2011-12-25 NOTE — Progress Notes (Signed)
Patient states she has a small boil on her left buttock that is draining

## 2011-12-25 NOTE — Plan of Care (Signed)
Problem: Consults Goal: Diagnosis- Total Joint Replacement Primary Total Knee     

## 2011-12-25 NOTE — H&P (Signed)
PREOPERATIVE H&P  Chief Complaint: r knee pain  HPI: Lauren Huffman is a 56 y.o. female who presents for evaluation of r knee pain. It has been present for greater than 1 year and has been worsening. She has failed conservative measures including pt, injections, and use of cane.  She has bone on bone on x-ray Pain is rated as severe.  Past Medical History  Diagnosis Date  . Sleep apnea     DOES NOT WEAR CPAP    . Mental disorder   . Depression   . Headache    Past Surgical History  Procedure Date  . No past surgeries    History   Social History  . Marital Status: Married    Spouse Name: N/A    Number of Children: N/A  . Years of Education: N/A   Social History Main Topics  . Smoking status: Never Smoker   . Smokeless tobacco: None  . Alcohol Use: No  . Drug Use: No  . Sexually Active:    Other Topics Concern  . None   Social History Narrative  . None   History reviewed. No pertinent family history. No Known Allergies Prior to Admission medications   Medication Sig Start Date End Date Taking? Authorizing Provider  acetaminophen (TYLENOL) 500 MG tablet Take 1,000 mg by mouth every 6 (six) hours as needed. For pain   Yes Historical Provider, MD  buPROPion (WELLBUTRIN XL) 150 MG 24 hr tablet Take 450 mg by mouth every morning.   Yes Historical Provider, MD  Calcium-Phosphorus-Vitamin D (CALCIUM GUMMIES PO) Take 1 tablet by mouth 2 (two) times daily.   Yes Historical Provider, MD  Cholecalciferol (VITAMIN D-3) 1000 UNITS CAPS Take 2,000 Units by mouth daily.   Yes Historical Provider, MD  citalopram (CELEXA) 40 MG tablet Take 40 mg by mouth every morning.   Yes Historical Provider, MD  Cyanocobalamin (VITAMIN B-12) 1000 MCG SUBL Place 1,000 mcg under the tongue daily.   Yes Historical Provider, MD  Fexofenadine HCl (ALLEGRA PO) Take 1 tablet by mouth daily.   Yes Historical Provider, MD  fish oil-omega-3 fatty acids 1000 MG capsule Take 1 g by mouth daily.   Yes Historical  Provider, MD  folic acid (FOLVITE) 1 MG tablet Take 2 mg by mouth every morning.   Yes Historical Provider, MD  Multiple Vitamins-Minerals (MULTIVITAMIN GUMMIES ADULT PO) Take 2 tablets by mouth daily.   Yes Historical Provider, MD  naproxen sodium (ANAPROX) 220 MG tablet Take 440 mg by mouth 2 (two) times daily with a meal.   Yes Historical Provider, MD     Positive ROS: none  All other systems have been reviewed and were otherwise negative with the exception of those mentioned in the HPI and as above.  Physical Exam: Filed Vitals:   12/25/11 0602  BP: 160/90  Pulse: 80  Temp: 98.2 F (36.8 C)  Resp: 16    General: Alert, no acute distress Cardiovascular: No pedal edema Respiratory: No cyanosis, no use of accessory musculature GI: No organomegaly, abdomen is soft and non-tender Skin: No lesions in the area of chief complaint Neurologic: Sensation intact distally Psychiatric: Patient is competent for consent with normal mood and affect Lymphatic: No axillary or cervical lymphadenopathy  MUSCULOSKELETAL: R.KNEE: painful rom//ttp over lat side //significant valgus allignment and grinding of pat-fem jt  Assessment/Plan: Degenerative joint disease Plan for Procedure(s): COMPUTER ASSISTED TOTAL KNEE ARTHROPLASTY right  The risks benefits and alternatives were discussed with the patient including  but not limited to the risks of nonoperative treatment, versus surgical intervention including infection, bleeding, nerve injury, malunion, nonunion, hardware prominence, hardware failure, need for hardware removal, blood clots, cardiopulmonary complications, morbidity, mortality, among others, and they were willing to proceed.  Predicted outcome is good, although there will be at least a six to nine month expected recovery.  Cru Kritikos L, MD 12/25/2011 7:33 AM

## 2011-12-25 NOTE — Clinical Social Work Psychosocial (Addendum)
    Clinical Social Work Department BRIEF PSYCHOSOCIAL ASSESSMENT 12/25/2011  Patient:  Lauren Huffman, Lauren Huffman     Account Number:  [id number]     Admit date:  12/25/2011  Clinical Social Worker:  Tiburcio Pea  Date/Time:  12/25/2011 05:31 PM  Referred by:  Physician  Date Referred:  12/25/2011 Referred for  SNF Placement   Other Referral:   Interview type:  Patient, husband and daughter Other interview type:    PSYCHOSOCIAL DATA Living Status:  HUSBAND Admitted from facility:   Level of care:   Primary support name:  Lauren Huffman Primary support relationship to patient:  SPOUSE Degree of support available:   Strong support    CURRENT CONCERNS Current Concerns  Post-Acute Placement   Other Concerns:    SOCIAL WORK ASSESSMENT / PLAN Patient referred for short term SNF and is requesting placement at [facility]. 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  Patient had surgery today and will ask weekend CSW to send PT/OT evals to SNF to forward to insurance in attempt to obtain auth. If no auth- patient will plan to return home with [facility] follow up.  Fl2 placed on chart; PASARR initiated.   Assessment/plan status:  Psychosocial Support/Ongoing Assessment of Needs Other assessment/ plan:   Information/referral to community resources:   SNF list deferred as patient pre-chose [facility]  Discussed after care needs - to be arranged by SNF as indicated    PATIENT'S/FAMILY'S RESPONSE TO PLAN OF CARE: Patient is alert and oriented; she is agreeable and requests short term SNF. Patient is aware that insurance auth will be required for placement at SNF.  Husband and daughter are extremely supportive and are prepared for d/c home if unable to obtain auth for SNF placement. CSW will re-evaluate on Monday.

## 2011-12-25 NOTE — Anesthesia Preprocedure Evaluation (Addendum)
Anesthesia Evaluation  Patient identified by MRN, date of birth, ID band Patient awake    Reviewed: Allergy & Precautions, H&P , NPO status , Patient's Chart, lab work & pertinent test results  Airway Mallampati: I TM Distance: >3 FB Neck ROM: Full    Dental  (+) Teeth Intact and Dental Advisory Given   Pulmonary sleep apnea ,  breath sounds clear to auscultation        Cardiovascular Rhythm:Regular Rate:Normal     Neuro/Psych Depression    GI/Hepatic   Endo/Other  Morbid obesity  Renal/GU      Musculoskeletal   Abdominal   Peds  Hematology   Anesthesia Other Findings   Reproductive/Obstetrics                           Anesthesia Physical Anesthesia Plan  ASA: II  Anesthesia Plan: General   Post-op Pain Management:    Induction: Intravenous  Airway Management Planned: Oral ETT  Additional Equipment:   Intra-op Plan:   Post-operative Plan: Extubation in OR  Informed Consent: I have reviewed the patients History and Physical, chart, labs and discussed the procedure including the risks, benefits and alternatives for the proposed anesthesia with the patient or authorized representative who has indicated his/her understanding and acceptance.   Dental advisory given  Plan Discussed with: CRNA, Anesthesiologist and Surgeon  Anesthesia Plan Comments:         Anesthesia Quick Evaluation

## 2011-12-25 NOTE — Progress Notes (Signed)
Orthopedic Tech Progress Note Patient Details:  Lauren Huffman 1955/07/23 [id number] CPM applied to patient's Right LE with appropriate settings *20 degrees extension 60 degrees flexion; OHF with trapeze bar applied to patient's bed CPM Right Knee CPM Right Knee: On Right Knee Flexion (Degrees): 60  Right Knee Extension (Degrees): 20    [name] 12/25/2011, 11:39 AM

## 2011-12-25 NOTE — Transfer of Care (Signed)
Immediate Anesthesia Transfer of Care Note  Patient: Lauren Huffman  Procedure(s) Performed: Procedure(s) (LRB) with comments: COMPUTER ASSISTED TOTAL KNEE ARTHROPLASTY (Right) - Right total knee replacement, general anesthesia, femoral nerve block  Patient Location: PACU  Anesthesia Type: General  Level of Consciousness: awake, alert  and oriented  Airway & Oxygen Therapy: Patient Spontanous Breathing and Patient connected to nasal cannula oxygen  Post-op Assessment: Report given to PACU RN and Post -op Vital signs reviewed and stable  Post vital signs: Reviewed  Complications: No apparent anesthesia complications

## 2011-12-25 NOTE — Preoperative (Signed)
Beta Blockers   Reason not to administer Beta Blockers:Not Applicable 

## 2011-12-25 NOTE — Anesthesia Postprocedure Evaluation (Signed)
  Anesthesia Post-op Note  Patient: Lauren Huffman  Procedure(s) Performed: Procedure(s) (LRB) with comments: COMPUTER ASSISTED TOTAL KNEE ARTHROPLASTY (Right) - Right total knee replacement, general anesthesia, femoral nerve block  Patient Location: PACU  Anesthesia Type: GA combined with regional for post-op pain  Level of Consciousness: awake, alert  and oriented  Airway and Oxygen Therapy: Patient Spontanous Breathing and Patient connected to nasal cannula oxygen  Post-op Pain: none  Post-op Assessment: Post-op Vital signs reviewed  Post-op Vital Signs: Reviewed  Complications: No apparent anesthesia complications

## 2011-12-25 NOTE — Progress Notes (Signed)
ANTICOAGULATION CONSULT NOTE - Initial Consult  Pharmacy Consult for Coumadin Indication: VTE prophylaxis  No Known Allergies  Vital Signs: Temp: 98 F (36.7 C) (09/20 1235) Temp src: Oral (09/20 0602) BP: 116/64 mmHg (09/20 1235) Pulse Rate: 98  (09/20 1235)  Labs: No results found for this basename: HGB:2,HCT:3,PLT:3,APTT:3,LABPROT:3,INR:3,HEPARINUNFRC:3,CREATININE:3,CKTOTAL:3,CKMB:3,TROPONINI:3 in the last 72 hours  CrCl is unknown because there is no height on file for the current visit.   Medical History: Past Medical History  Diagnosis Date  . Sleep apnea     DOES NOT WEAR CPAP    . Mental disorder   . Depression   . Headache     Medications:  Prescriptions prior to admission  Medication Sig Dispense Refill  . acetaminophen (TYLENOL) 500 MG tablet Take 1,000 mg by mouth every 6 (six) hours as needed. For pain      . buPROPion (WELLBUTRIN XL) 150 MG 24 hr tablet Take 450 mg by mouth every morning.      . Calcium-Phosphorus-Vitamin D (CALCIUM GUMMIES PO) Take 1 tablet by mouth 2 (two) times daily.      . Cholecalciferol (VITAMIN D-3) 1000 UNITS CAPS Take 2,000 Units by mouth daily.      . citalopram (CELEXA) 40 MG tablet Take 40 mg by mouth every morning.      . Cyanocobalamin (VITAMIN B-12) 1000 MCG SUBL Place 1,000 mcg under the tongue daily.      Marland Kitchen Fexofenadine HCl (ALLEGRA PO) Take 1 tablet by mouth daily.      . fish oil-omega-3 fatty acids 1000 MG capsule Take 1 g by mouth daily.      . folic acid (FOLVITE) 1 MG tablet Take 2 mg by mouth every morning.      . Multiple Vitamins-Minerals (MULTIVITAMIN GUMMIES ADULT PO) Take 2 tablets by mouth daily.      . naproxen sodium (ANAPROX) 220 MG tablet Take 440 mg by mouth 2 (two) times daily with a meal.        Assessment: 56 y/o female patient s/p right TKA requiring coumadin for dvt prophylaxis. Baseline INR wnl.   Goal of Therapy:  INR 2-3 Monitor platelets by anticoagulation protocol: Yes   Plan:  Coumadin  7.5mg  today and f/u daily protime.  Verlene Mayer, PharmD, BCPS Pager [phone] 12/25/2011,1:17 PM

## 2011-12-25 NOTE — Progress Notes (Signed)
UR COMPLETED  

## 2011-12-25 NOTE — Clinical Social Work Placement (Addendum)
    Clinical Social Work Department CLINICAL SOCIAL WORK PLACEMENT NOTE 12/25/2011  Patient:  Lauren Huffman, Lauren Huffman  Account Number:  [id number] Admit date:  12/25/2011  Clinical Social Worker:  Tiburcio Pea  Date/time:  12/25/2011 05:35 PM  Clinical Social Work is seeking post-discharge placement for this patient at the following level of care:   SKILLED NURSING   (*CSW will update this form in Epic as items are completed)     Patient/family provided with [facility] Department of Clinical Social Work's list of facilities offering this level of care within the geographic area requested by the patient (or if unable, by the patient's family).    Patient/family informed of their freedom to choose among providers that offer the needed level of care, that participate in Medicare, Medicaid or managed care program needed by the patient, have an available bed and are willing to accept the patient.    Patient/family informed of [facility]' ownership interest in [name], as well as of the fact that they are under no obligation to receive care at this facility.  PASARR submitted to EDS on 12/25/2011 PASARR number received from EDS on   FL2 transmitted to all facilities in geographic area requested by pt/family on  12/25/2011 FL2 transmitted to all facilities within larger geographic area on   Patient informed that his/her managed care company has contracts with or will negotiate with  certain facilities, including the following:   [facility]  SNF list deferred- pre-chose [facility]     Patient/family informed of bed offers received: [phone]   Patient chooses bed at [facility] Physician recommends and patient chooses bed at    Patient to be transferred to [facility]  on 12/29/2011 (JS) Patient to be transferred to facility by [facility] (JS)  The following physician request were entered in Epic:   Additional Comments:Bed search will be initiated.   Lorri Frederick. [place]  [phone]

## 2011-12-26 LAB — BASIC METABOLIC PANEL
Calcium: 8.9 mg/dL (ref 8.4–10.5)
GFR calc Af Amer: >90 mL/min (ref >90)
GFR calc non Af Amer: >90 mL/min (ref >90)
Glucose, Bld: 143 mg/dL — ABNORMAL HIGH (ref 70–99)
Sodium: 142 mEq/L (ref 135–145)

## 2011-12-26 LAB — CBC
Hemoglobin: 9.7 g/dL — ABNORMAL LOW (ref 12.0–15.0)
MCH: 29.5 pg (ref 26.0–34.0)
MCHC: 32.6 g/dL (ref 30.0–36.0)
RDW: 13.9 % (ref 11.5–15.5)

## 2011-12-26 LAB — PROTIME-INR: Prothrombin Time: 14.7 seconds (ref 11.6–15.2)

## 2011-12-26 MED ORDER — WARFARIN SODIUM 7.5 MG PO TABS
7.5000 mg | ORAL_TABLET | Freq: Once | ORAL | Status: AC
  Administered 2011-12-26: 7.5 mg via ORAL
  Filled 2011-12-26: qty 1

## 2011-12-26 MED ORDER — ACETAMINOPHEN 325 MG PO TABS
650.0000 mg | ORAL_TABLET | Freq: Four times a day (QID) | ORAL | Status: DC | PRN
  Administered 2011-12-26 – 2011-12-29 (×9): 650 mg via ORAL
  Filled 2011-12-26 (×9): qty 2

## 2011-12-26 NOTE — Evaluation (Signed)
Physical Therapy Evaluation Patient Details Name: Lauren Huffman MRN: [id number] DOB: September 21, 1955 Today's Date: 12/26/2011 Time: 4098-1191 PT Time Calculation (min): 46 min  PT Assessment / Plan / Recommendation Clinical Impression  Pt is 56 y/o female admitted for s/p right TKA.  Pt very motivated and willing to work.  Pt will benefit from acute PT services to improve overall mobility and prepare for safe d/c to next venue.    PT Assessment  Patient needs continued PT services    Follow Up Recommendations  Skilled nursing facility (Pt perfers [facility])    Barriers to Discharge None      Equipment Recommendations  Rolling walker with 5" wheels;3 in 1 bedside comode    Recommendations for Other Services     Frequency 7X/week    Precautions / Restrictions Precautions Precautions: Posterior Hip;Fall Required Braces or Orthoses: Knee Immobilizer - Right Knee Immobilizer - Right: On when out of bed or walking Restrictions Weight Bearing Restrictions: Yes RLE Weight Bearing: Weight bearing as tolerated   Pertinent Vitals/Pain 7/10 right knee pain      Mobility  Bed Mobility Bed Mobility: Supine to Sit;Sitting - Scoot to Edge of Bed Supine to Sit: 4: Min assist;With rails;HOB elevated Details for Bed Mobility Assistance: (A) with right LE OOB with max cues for proper technique Transfers Transfers: Sit to Stand;Stand to Sit Sit to Stand: 4: Min assist;From chair/3-in-1 Stand to Sit: 4: Min assist Details for Transfer Assistance: (A) to initiate transfer and slowly descend to recliner with max cues for hand and LE placement Ambulation/Gait Ambulation/Gait Assistance: 4: Min guard Ambulation Distance (Feet): 10 Feet Assistive device: Rolling walker Ambulation/Gait Assistance Details: Minguard for safety with cues for proper step sequence. Gait Pattern: Step-to pattern;Decreased stride length;Antalgic;Trunk flexed    Exercises Total Joint Exercises Ankle Circles/Pumps:  AAROM;Both;10 reps Quad Sets: AAROM;Strengthening;Right;5 reps Heel Slides: AAROM;Strengthening;5 reps   PT Diagnosis: Difficulty walking;Abnormality of gait;Acute pain  PT Problem List: Decreased strength;Decreased range of motion;Decreased activity tolerance;Decreased balance;Decreased knowledge of use of DME;Pain PT Treatment Interventions: DME instruction;Gait training;Functional mobility training;Therapeutic activities;Therapeutic exercise;Balance training   PT Goals Acute Rehab PT Goals PT Goal Formulation: With patient Time For Goal Achievement: 01/02/12 Potential to Achieve Goals: Good Pt will go Supine/Side to Sit: with modified independence PT Goal: Supine/Side to Sit - Progress: Goal set today Pt will go Sit to Supine/Side: with modified independence PT Goal: Sit to Supine/Side - Progress: Goal set today Pt will go Sit to Stand: with modified independence PT Goal: Sit to Stand - Progress: Goal set today Pt will go Stand to Sit: with modified independence PT Goal: Stand to Sit - Progress: Goal set today Pt will Ambulate: >150 feet;with modified independence;with rolling walker PT Goal: Ambulate - Progress: Goal set today Pt will Perform Home Exercise Program: with supervision, verbal cues required/provided PT Goal: Perform Home Exercise Program - Progress: Goal set today  Visit Information  Last PT Received On: 12/26/11 Assistance Needed: +1    Subjective Data  Subjective: "I'm feeling great this morning." Patient Stated Goal: To go to rehab   Prior Functioning  Home Living Lives With: Spouse Available Help at Discharge: Family Type of Home: House Home Access: Stairs to enter Secretary/administrator of Steps: 2 Entrance Stairs-Rails: None Home Layout: Two level (basement Laundry location) Alternate Level Stairs-Number of Steps: 14 Bathroom Shower/Tub: Tub/shower unit;Door [employer] Toilet: Standard Bathroom Accessibility: Yes How Accessible: Accessible via  walker Home Adaptive Equipment: Straight cane Additional Comments: Plans to go [facility] for Rehab Prior Function Level of Independence: Independent with assistive device(s) (Uses SPC) Able to Take Stairs?: Yes Driving: Yes 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[employer]) Communication Communication: No difficulties Dominant Hand: Right    Cognition  Overall Cognitive Status: Appears within functional limits for tasks assessed/performed Arousal/Alertness: Awake/alert Orientation Level: Appears intact for tasks assessed Behavior During Session: [facility] for tasks performed    Extremity/Trunk Assessment Right Lower Extremity Assessment RLE ROM/Strength/Tone: Unable to fully assess;Deficits;Due to pain RLE ROM/Strength/Tone Deficits: Limited knee flexion AAROM 0-90 degrees Left Lower Extremity Assessment LLE ROM/Strength/Tone: Within functional levels   Balance    End of Session CPM Right Knee CPM Right Knee: Off  GP     Hershey Knauer 12/26/2011, 10:14 AM Jake Shark, PT DPT [phone]

## 2011-12-26 NOTE — Progress Notes (Signed)
ANTICOAGULATION CONSULT NOTE - Follow Up Consult  Pharmacy Consult for coumadin Indication: VTE prophylaxis  No Known Allergies  Patient Measurements:     Vital Signs: Temp: 98.5 F (36.9 C) (09/21 0537) Temp src: Oral (09/21 0537) BP: 137/70 mmHg (09/21 0537) Pulse Rate: 86  (09/21 0537)  Labs:  Basename 12/26/11 0455  HGB 9.7*  HCT 29.8*  PLT 238  APTT --  LABPROT 14.7  INR 1.17  HEPARINUNFRC --  CREATININE 0.72  CKTOTAL --  CKMB --  TROPONINI --    CrCl is unknown because there is no height on file for the current visit.   Medications:  Scheduled:    . acetaminophen  1,000 mg Intravenous Q6H  . buPROPion  450 mg Oral q morning - 10a  .  ceFAZolin (ANCEF) IV  2 g Intravenous Q6H  . cholecalciferol  2,000 Units Oral Daily  . citalopram  40 mg Oral q morning - 10a  . coumadin book   Does not apply Once  . docusate sodium  100 mg Oral BID  . ferrous sulfate  325 mg Oral BID WC  . folic acid  2 mg Oral q morning - 10a  . HYDROmorphone      . ketorolac  7.5 mg Intravenous Q6H  . loratadine  10 mg Oral Daily  . vitamin B-12  1,000 mcg Oral Daily  . warfarin  7.5 mg Oral ONCE-1800  . warfarin  7.5 mg Oral ONCE-1800  . warfarin   Does not apply Once  . Warfarin - Pharmacist Dosing Inpatient   Does not apply q1800  . DISCONTD: povidone-iodine   Topical Once  . DISCONTD: Vitamin B-12  1,000 mcg Sublingual Daily    Assessment: 56 y.o female s/p right TKA on coumadin for DV T prophylaxis. INR today 1.17 after 7.5 mg dose.  Goal of Therapy:  INR 2-3 Monitor platelets by anticoagulation protocol: Yes   Plan:  Coumadin 7.5 mg x1 Follow up daily INR Monitor s.sx of bleeding  Bola A. Wandra Feinstein D Clinical Pharmacist [phone] Phone [phone] 12/26/2011 8:34 AM

## 2011-12-26 NOTE — Progress Notes (Signed)
Subjective: 1 Day Post-Op Procedure(s) (LRB): COMPUTER ASSISTED TOTAL KNEE ARTHROPLASTY (Right)  Activity level:  Will be oob Diet tolerance:  ok Voiding:  ok Patient reports pain as 2 on 0-10 scale.    Objective: Vital signs in last 24 hours: Temp:  [97.4 F (36.3 C)-98.5 F (36.9 C)] 98.5 F (36.9 C) (09/21 0537) Pulse Rate:  [78-98] 86  (09/21 0537) Resp:  [13-18] 18  (09/21 0537) BP: (110-150)/(64-86) 137/70 mmHg (09/21 0537) SpO2:  [94 %-100 %] 98 % (09/21 0537)  Labs:  Basename 12/26/11 0455  HGB 9.7*    Basename 12/26/11 0455  WBC 11.7*  RBC 3.29*  HCT 29.8*  PLT 238    Basename 12/26/11 0455  NA 142  K 4.2  CL 105  CO2 29  BUN 15  CREATININE 0.72  GLUCOSE 143*  CALCIUM 8.9    Basename 12/26/11 0455  LABPT --  INR 1.17    Physical Exam:  Neurologically intact ABD soft Neurovascular intact Sensation intact distally Intact pulses distally Dorsiflexion/Plantar flexion intact Incision: dressing C/D/I No cellulitis present Compartment soft  Assessment/Plan:  1 Day Post-Op Procedure(s) (LRB): COMPUTER ASSISTED TOTAL KNEE ARTHROPLASTY (Right) Advance diet Up with therapy D/C IV fluids Discharge to SNF Monday  dressing and drain sunday    Lauren Huffman R 12/26/2011, 7:57 AM

## 2011-12-26 NOTE — Op Note (Signed)
Lauren Huffman, Lauren Huffman NO.:  [id number]  MEDICAL RECORD NO.:  [id number]  LOCATION:  [id number]                        FACILITY:  [facility]  PHYSICIAN:  [name], M.D.   DATE OF BIRTH:  [birth year]  DATE OF PROCEDURE:  12/25/2011 DATE OF DISCHARGE:                              OPERATIVE REPORT   PREOPERATIVE DIAGNOSIS:  Severe end-stage degenerative joint disease of right knee with severe valgus malalignment.  POSTOPERATIVE DIAGNOSIS:  Severe end-stage degenerative joint disease of right knee with severe valgus malalignment.  PROCEDURE: 1. Right total knee replacement with a Sigma system, size 4 narrow     femur, size 3 tibia, 12.5 mm bridging bearing, and a 38 mm all     polyethylene patella. 2. Lateral retinacular release right. 3. Computer-assisted right total knee replacement.  SURGEON:  [name], M.D.  ASSISTANT:  Marshia Ly, PA.  ANESTHESIA:  General.  BRIEF HISTORY:  The patient is a 56 year old female with a long history who has severe bilateral degenerative joint disease of knees.  She developed a severe valgus malalignment with severe lateral maltracking of the patella over many years.  We treated it conservatively.  We initially met her for a period of time with activity modification with the use of cane, anti-inflammatory medication, and injection therapy. All of this failed because of failure of all conservative care, she was taken to the operating room for right total knee replacement.  We knew going with it is going to be a very difficult case given her severe valgus malalignment and lateral contracture and the severe lateral patellar maltracking.  We chose to use computer assistance preoperatively to help with this deformity and she was brought to the operating room for this procedure.  DESCRIPTION OF THE PROCEDURE:  The patient was brought to the operating room.  After adequate level of anesthesia was obtained with  general anesthetic, the patient was placed supine on the operating table.  The right leg was prepped and draped in the usual sterile fashion. Following this, the leg was exsanguinated.  Blood pressure tourniquet inflated to 350 mm hg.  Following this, a curved incision was made in the valgus direction and the subcutaneous tissue down to the level of extensor mechanism and medial parapatellar arthrotomy was undertaken. Attention was then turned to the knee were medial and lateral meniscus were removed, retropatellar fat pad, and synovium in the anterior aspect of the femur, and anterior posterior cruciates.  Once this was done, osteophytes were removed.  Attention was turned towards placement of the computer modules, two pins in the tibia, two pins in the femur, and arrays were placed.  A registration process was undertaken.  This adds 30 minutes of surgical procedure.  Once this was completed, attention was turned to the tibia which was cut perpendicular to its long axis. Attention was turned to the femur which was cut perpendicular to the anatomic axis and then attention was turned towards the ligament balancing.  At this point, there was clear tightness on the lateral side.  We released the IP band, we released the lateral capsule, and now with knee irrigating the lateral side opened  at this point.  We went up and at a sleeve, took down the lateral collateral ligament and the popliteus tendon as the sleeve.  Once this was done, we got better on the lateral side still very tight.  We knew that there was a tremendous amount of bone in the back from the preoperative x-rays and templating, and at this point, we went forward, we sized the femur to a 4 posterior lies.  The femurs were new, we were getting into a flexion gap issue and made cuts.  Then we made anterior, posterior, and box cut.  We then placed the tibia as a size 3, drilled, and keeled.  At this point, we went back to the femur  and then began working with this removal of posterior bone.  Initially, took an osteotome 3/4 curved osteotome and took bone out of the lateral side and the medial side.  We then from our preoperative x-ray knew she had a huge loose body in the back and very carefully and meticulously we went back in the back and a kind of probe, and we were able to identify the loose body and careful under direct vision.  We were able to remove it.  Once we did this, we finally for the first time were able to get it down and then ultimately a 12.5 poly and the 12.5 was perfect in flexion, so we really felt like we had balanced it perfectly at this point.  Computer showed [place] neutral long alignment and perfect gap balance.  At this point, attention was turned towards the patella, it was cut down to a level of 14 and 35 poly paddle was placed was drilled and the trials were placed, and knee put through a range of motion, perfect stability and perfect straight knee at this point.  The knee at this point, was copiously irrigated and suctioned dry.  The final components were cemented in place, size 3 tibia, size 4 narrow femur, a 12.5 mm bridging bearing trial was placed and attention was turned to the patella and a 35 poly was placed with a clamp put in place.  Once that was done, the cement was allowed to harden.  Once the cement was completely hardened, we turned back into the knee and the tourniquet down.  The patella was tracking dramatically laterally at this point and we felt that we had to have a lateral retinacular release.  Moderate neck release was performed under direct vision and this did identify the two 2 bleeders on the lateral side of the wound and these were cauterized.  The remaining knee was irrigated and all bleeding was controlled with electrocautery.  The final 12.5 poly was placed and the medial parapatellar arthrotomy was closed and the knee put through the range of motion, excellent  flexion and excellent stability in both flexion and extension.  At this point, the skin was closed with 2-0 Vicryl and staples.  Sterile compressive dressing was applied, and the patient placed in about 15 degrees of flexion which will maintain overnight until her block wears off.  We are going to make sure that the perineal nerve.  We will start her on CPM, but will do it with about 22 to 60 to start and we will up the flexion gap, but not bring her into extension until we are certain that her peroneal nerve is okay which will be when her block wears off on postoperative day #1. The estimated blood loss for the procedure was  minimal.     [name], M.D.     Ranae Plumber  D:  12/25/2011  T:  12/26/2011  Job:  [id number]  cc:   Marshia Ly, P.A.

## 2011-12-27 LAB — CBC
HCT: 30.8 % — ABNORMAL LOW (ref 36.0–46.0)
MCHC: 33.1 g/dL (ref 30.0–36.0)
MCV: 91.1 fL (ref 78.0–100.0)
RDW: 14.2 % (ref 11.5–15.5)

## 2011-12-27 LAB — PROTIME-INR: INR: 1.63 — ABNORMAL HIGH (ref 0.00–1.49)

## 2011-12-27 MED ORDER — WARFARIN SODIUM 5 MG PO TABS
5.0000 mg | ORAL_TABLET | Freq: Once | ORAL | Status: AC
  Administered 2011-12-27: 5 mg via ORAL
  Filled 2011-12-27 (×3): qty 1

## 2011-12-27 NOTE — Progress Notes (Signed)
ANTICOAGULATION CONSULT NOTE - Follow Up Consult  Pharmacy Consult for coumadin Indication: VTE prophylaxis  No Known Allergies  Patient Measurements:    Vital Signs: Temp: 99 F (37.2 C) (09/22 0551) BP: 131/76 mmHg (09/22 0551) Pulse Rate: 93  (09/22 0551)  Labs:  Basename 12/27/11 0625 12/26/11 0455  HGB 10.2* 9.7*  HCT 30.8* 29.8*  PLT 221 238  APTT -- --  LABPROT 18.8* 14.7  INR 1.63* 1.17  HEPARINUNFRC -- --  CREATININE -- 0.72  CKTOTAL -- --  CKMB -- --  TROPONINI -- --    CrCl is unknown because there is no height on file for the current visit.   Medications:  Scheduled:    . acetaminophen  1,000 mg Intravenous Q6H  . buPROPion  450 mg Oral q morning - 10a  . cholecalciferol  2,000 Units Oral Daily  . citalopram  40 mg Oral q morning - 10a  . coumadin book   Does not apply Once  . docusate sodium  100 mg Oral BID  . ferrous sulfate  325 mg Oral BID WC  . folic acid  2 mg Oral q morning - 10a  . ketorolac  7.5 mg Intravenous Q6H  . loratadine  10 mg Oral Daily  . vitamin B-12  1,000 mcg Oral Daily  . warfarin  7.5 mg Oral ONCE-1800  . warfarin   Does not apply Once  . Warfarin - Pharmacist Dosing Inpatient   Does not apply q1800    Assessment: 56 y.o female s/p right TKA on coumadin for DV T prophylaxis. INR today 1.63  after 2 doses of 7.5 mg. H/H 10.2/30.8, plts 221 stable.   Goal of Therapy:  INR 2-3 Monitor platelets by anticoagulation protocol: Yes   Plan:  Coumadin 5 mg x 1 Follow up daily INR Monitor s/sx of bleeding  Bola A. Wandra Feinstein D Clinical Pharmacist Pager:[phone] Phone [phone] 12/27/2011 7:57 AM

## 2011-12-27 NOTE — Progress Notes (Signed)
Subjective: 2 Days Post-Op Procedure(s) (LRB): COMPUTER ASSISTED TOTAL KNEE ARTHROPLASTY (Right)  Activity level:  oob with therapy Diet tolerance:  ok Voiding:  ok Patient reports pain as 2 on 0-10 scale.    Objective: Vital signs in last 24 hours: Temp:  [98.1 F (36.7 C)-99 F (37.2 C)] 99 F (37.2 C) (09/22 0551) Pulse Rate:  [82-93] 93  (09/22 0551) Resp:  [18-20] 18  (09/22 0551) BP: (123-131)/(59-76) 131/76 mmHg (09/22 0551) SpO2:  [96 %-99 %] 96 % (09/22 0551)  Labs:  Basename 12/27/11 0625 12/26/11 0455  HGB 10.2* 9.7*    Basename 12/27/11 0625 12/26/11 0455  WBC 10.6* 11.7*  RBC 3.38* 3.29*  HCT 30.8* 29.8*  PLT 221 238    Basename 12/26/11 0455  NA 142  K 4.2  CL 105  CO2 29  BUN 15  CREATININE 0.72  GLUCOSE 143*  CALCIUM 8.9    Basename 12/27/11 0625 12/26/11 0455  LABPT -- --  INR 1.63* 1.17    Physical Exam:  Neurologically intact ABD soft Neurovascular intact Sensation intact distally Intact pulses distally Dorsiflexion/Plantar flexion intact No cellulitis present Compartment soft Drain pulled and dressing changed Wound wnl  Assessment/Plan:  2 Days Post-Op Procedure(s) (LRB): COMPUTER ASSISTED TOTAL KNEE ARTHROPLASTY (Right) Advance diet Up with therapy Discharge to SNF Monday    Treniyah Lynn R 12/27/2011, 10:48 AM

## 2011-12-27 NOTE — Progress Notes (Signed)
Physical Therapy Treatment Patient Details Name: Lauren Huffman MRN: [id number] DOB: [birth year] Today's Date: 12/27/2011 Time: 6578-4696 PT Time Calculation (min): 21 min  PT Assessment / Plan / Recommendation Comments on Treatment Session  Pt progressing well with mobility & PT goals.  Increased ambulation distance today.  Ambulated without KI & no knee buckling noted.  Pt very motivated!!    Follow Up Recommendations  Skilled nursing facility    Barriers to Discharge        Equipment Recommendations  Rolling walker with 5" wheels;3 in 1 bedside comode    Recommendations for Other Services    Frequency 7X/week   Plan Discharge plan remains appropriate    Precautions / Restrictions Precautions Precautions: Knee Required Braces or Orthoses: Knee Immobilizer - Right Knee Immobilizer - Right: On when out of bed or walking Restrictions RLE Weight Bearing: Weight bearing as tolerated    Pertinent Vitals/Pain 4/10 knee.  Not due for pain medication for ~ another 30 minutes per pt.      Mobility  Bed Mobility Bed Mobility: Not assessed Transfers Transfers: Sit to Stand;Stand to Sit Sit to Stand: 4: Min guard;With upper extremity assist;With armrests;From chair/3-in-1 Stand to Sit: 4: Min guard;With upper extremity assist;With armrests;To chair/3-in-1 Details for Transfer Assistance: cues for hand placement Ambulation/Gait Ambulation/Gait Assistance: 4: Min guard Ambulation Distance (Feet): 100 Feet Assistive device: Rolling walker Ambulation/Gait Assistance Details: Cues for sequencing, tall posture, terminal knee extension during stance phase R LE,  decrease reliance of UE's on RW & attempt increased weight bearing through R LE.   Gait Pattern: Step-to pattern;Decreased stance time - right;Decreased step length - left;Antalgic Stairs: No Wheelchair Mobility Wheelchair Mobility: No    Exercises Total Joint Exercises Ankle Circles/Pumps: AROM;Both;10 reps Quad Sets:  AROM;Both;10 reps Heel Slides: AAROM;Right;10 reps Straight Leg Raises: AAROM;Right;10 reps Long Arc Quad: AAROM;Right;10 reps    PT Goals Acute Rehab PT Goals Time For Goal Achievement: 01/02/12 Potential to Achieve Goals: Good PT Goal: Sit to Stand - Progress: Progressing toward goal PT Goal: Stand to Sit - Progress: Progressing toward goal PT Goal: Ambulate - Progress: Progressing toward goal PT Goal: Perform Home Exercise Program - Progress: Progressing toward goal  Visit Information  Last PT Received On: 12/27/11 Assistance Needed: +1    Subjective Data  Patient Stated Goal: To go to rehab   Cognition  Overall Cognitive Status: Appears within functional limits for tasks assessed/performed Arousal/Alertness: Awake/alert Orientation Level: Appears intact for tasks assessed Behavior During Session: [facility] for tasks performed    Balance     End of Session PT - End of Session Equipment Utilized During Treatment: Gait belt Activity Tolerance: Patient tolerated treatment well Patient left: in chair;with call bell/phone within reach;with family/visitor present Nurse Communication: Mobility status     Verdell Face, [place] [phone] 12/27/2011

## 2011-12-28 DIAGNOSIS — E669 Obesity, unspecified: Secondary | ICD-10-CM | POA: Diagnosis present

## 2011-12-28 LAB — CBC
HCT: 31.2 % — ABNORMAL LOW (ref 36.0–46.0)
MCHC: 32.1 g/dL (ref 30.0–36.0)
MCV: 90.7 fL (ref 78.0–100.0)
RDW: 14.2 % (ref 11.5–15.5)

## 2011-12-28 LAB — PROTIME-INR
INR: 2.24 — ABNORMAL HIGH (ref 0.00–1.49)
Prothrombin Time: 23.8 s — ABNORMAL HIGH (ref 11.6–15.2)

## 2011-12-28 MED ORDER — WARFARIN SODIUM 5 MG PO TABS: 5.0000 mg | ORAL_TABLET | Freq: Every day | ORAL | Status: DC

## 2011-12-28 MED ORDER — WARFARIN SODIUM 5 MG PO TABS: ORAL_TABLET | ORAL | Status: DC

## 2011-12-28 MED ORDER — OXYCODONE-ACETAMINOPHEN 5-325 MG PO TABS: 1.0000 | ORAL_TABLET | ORAL | Status: DC | PRN

## 2011-12-28 MED ORDER — METHOCARBAMOL 500 MG PO TABS: 500.0000 mg | ORAL_TABLET | Freq: Four times a day (QID) | ORAL | Status: DC

## 2011-12-28 MED ORDER — METHOCARBAMOL 500 MG PO TABS: 500.0000 mg | ORAL_TABLET | Freq: Four times a day (QID) | ORAL | Status: DC | PRN

## 2011-12-28 MED ORDER — WARFARIN SODIUM 2 MG PO TABS
2.0000 mg | ORAL_TABLET | Freq: Once | ORAL | Status: AC
  Administered 2011-12-28: 2 mg via ORAL
  Filled 2011-12-28 (×2): qty 1

## 2011-12-28 NOTE — Progress Notes (Signed)
Physical Therapy Treatment Patient Details Name: Lauren Huffman MRN: [id number] DOB: 1955/10/08 Today's Date: 12/28/2011 Time: 1427-1500 PT Time Calculation (min): 33 min  PT Assessment / Plan / Recommendation Comments on Treatment Session  Pt able to perform stair negotiation.  Pt continues to be anxious about possible d/c home and continues to perfer [facility] prior to d/c home.  If d/c home will need  HHPT/OT and 3n1 at d/c.    Follow Up Recommendations  Skilled nursing facility    Barriers to Discharge  No assistance during the day      Equipment Recommendations  Defer to next venue;3 in 1 bedside comode (If pt unable to go to SNF will need HHPT/HHOT)    Recommendations for Other Services  (none)  Frequency 7X/week   Plan Discharge plan remains appropriate    Precautions / Restrictions Precautions Precautions: Knee Restrictions Weight Bearing Restrictions: Yes RLE Weight Bearing: Weight bearing as tolerated   Pertinent Vitals/Pain No c/o pain noted    Mobility  Bed Mobility Bed Mobility: Sit to Supine Supine to Sit: 4: Min guard;HOB flat Sit to Supine: 4: Min guard Details for Bed Mobility Assistance: Minguard for safety Transfers Transfers: Sit to Stand;Stand to Sit Sit to Stand: 4: Min guard;With upper extremity assist;From chair/3-in-1;From toilet Stand to Sit: 4: Min guard;Without upper extremity assist;To bed;To chair/3-in-1;To toilet Details for Transfer Assistance: Minguard for safety with cues for hand placement Ambulation/Gait Ambulation/Gait Assistance: 4: Min guard Assistive device: Rolling walker Ambulation/Gait Assistance Details: Minguard for safety with cues for step sequence.  Encouraging step through gait sequence. Gait Pattern: Step-to pattern;Step-through pattern;Antalgic Gait velocity: decreased Stairs: Yes Stairs Assistance: 4: Min guard (Minguard safety with cues for step sequence) Stair Management Technique: Backwards;With walker;No  rails Number of Stairs: 1  Wheelchair Mobility Wheelchair Mobility: No    Exercises     PT Diagnosis:    PT Problem List:   PT Treatment Interventions:     PT Goals Acute Rehab PT Goals PT Goal Formulation: With patient Time For Goal Achievement: 01/02/12 Potential to Achieve Goals: Good Pt will go Supine/Side to Sit: with modified independence PT Goal: Supine/Side to Sit - Progress: Progressing toward goal Pt will go Sit to Supine/Side: with modified independence PT Goal: Sit to Supine/Side - Progress: Progressing toward goal Pt will go Sit to Stand: with modified independence PT Goal: Sit to Stand - Progress: Progressing toward goal Pt will go Stand to Sit: with modified independence PT Goal: Stand to Sit - Progress: Progressing toward goal Pt will Ambulate: >150 feet;with modified independence;with rolling walker PT Goal: Ambulate - Progress: Progressing toward goal Pt will Go Up / Down Stairs: 1-2 stairs;with modified independence;with rolling walker PT Goal: Up/Down Stairs - Progress: Goal set today  Visit Information  Last PT Received On: 12/28/11 Assistance Needed: +1    Subjective Data  Subjective: "Just waiting to here from the insurance company if I can go to rehab.  If not they are sending me home today." Patient Stated Goal: To go to [facility]   Cognition  Overall Cognitive Status: Appears within functional limits for tasks assessed/performed Arousal/Alertness: Awake/alert Orientation Level: Appears intact for tasks assessed Behavior During Session: [facility] for tasks performed    Balance     End of Session PT - End of Session Equipment Utilized During Treatment: Gait belt Activity Tolerance: Patient tolerated treatment well Patient left: in bed;with call bell/phone within reach Nurse Communication: Mobility status   GP     Jake Fuhrmann  12/28/2011, 3:04 PM Jake Shark, PT DPT [phone]

## 2011-12-28 NOTE — Progress Notes (Signed)
Subjective: 3 Days Post-Op Procedure(s) (LRB): COMPUTER ASSISTED TOTAL KNEE ARTHROPLASTY (Right) Patient reports pain as moderate.    Objective: Vital signs in last 24 hours: Temp:  [98.4 F (36.9 C)-98.6 F (37 C)] 98.4 F (36.9 C) (09/23 0535) Pulse Rate:  [90-93] 90  (09/23 0535) Resp:  [18] 18  (09/23 0535) BP: (117-125)/(60-64) 125/64 mmHg (09/23 0535) SpO2:  [98 %] 98 % (09/23 0535)  Intake/Output from previous day: 09/22 0701 - 09/23 0700 In: 960 [P.O.:960] Out: 75 [Drains:75] Intake/Output this shift:     Basename 12/27/11 0625 12/26/11 0455  HGB 10.2* 9.7*    Basename 12/27/11 0625 12/26/11 0455  WBC 10.6* 11.7*  RBC 3.38* 3.29*  HCT 30.8* 29.8*  PLT 221 238    Basename 12/26/11 0455  NA 142  K 4.2  CL 105  CO2 29  BUN 15  CREATININE 0.72  GLUCOSE 143*  CALCIUM 8.9    Basename 12/27/11 0625 12/26/11 0455  LABPT -- --  INR 1.63* 1.17    Neurologically intact ABD soft Neurovascular intact Sensation intact distally Intact pulses distally No cellulitis present Compartment soft  Assessment/Plan: 3 Days Post-Op Procedure(s) (LRB): COMPUTER ASSISTED TOTAL KNEE ARTHROPLASTY (Right) Advance diet Discharge to SNF as pt has no help in daytime at home  Lauren Huffman L 12/28/2011, 7:48 AM

## 2011-12-28 NOTE — Progress Notes (Signed)
ANTICOAGULATION CONSULT - COUMADIN  Pharmacy Consult for Coumadin  HPI: 56 y.o.female admitted for Degenerative joint disease who is currently on Coumadin for VTE prophylaxis s/p R-TKA.  Allergies: No Known Allergies  Height/Weight: Height: 5' 8.11" (173 cm) Weight: 253 lb 8.5 oz (115 kg) IBW/kg (Calculated) : 64.15   Vitals: Blood pressure 125/64, pulse 90, temperature 98.4 F (36.9 C), temperature source Oral, resp. rate 18, height 5' 8.11" (1.73 m), weight 253 lb 8.5 oz (115 kg), SpO2 98.00%.  Current active problems: Principal Problem:  *Osteoarthritis of right knee Active Problems:  Obesity   Medical / Surgical History: Diagnosis  . Sleep apnea  . Mental disorder  . Depression  . Headache   Past Surgical History  Procedure Date  . No past surgeries     Current Labs:  [facility] 12/28/11 0750 12/27/11 0625 12/26/11 0455  HGB 10.0* 10.2* --  HCT 31.2* 30.8* 29.8*  PLT 247 221 238  LABPROT 23.8* 18.8* 14.7  INR 2.24* 1.63* 1.17  CREATININE -- -- 0.72   Lab Results  Component Value Date   INR 2.24* 12/28/2011   INR 1.63* 12/27/2011   INR 1.17 12/26/2011   Estimated Creatinine Clearance: 102.3 ml/min (by C-G formula based on Cr of 0.72).  Pertinent Medication History: Medication Sig  . buPROPion (WELLBUTRIN XL) 150 MG 24 hr tablet Take 450 mg by mouth every morning.  . Calcium-Phosphorus-Vitamin D (CALCIUM GUMMIES PO) Take 1 tablet by mouth 2 (two) times daily.  . Cholecalciferol (VITAMIN D-3) 1000 UNITS CAPS Take 2,000 Units by mouth daily.  . citalopram (CELEXA) 40 MG tablet Take 40 mg by mouth every morning.  . Cyanocobalamin (VITAMIN B-12) 1000 MCG SUBL Place 1,000 mcg under the tongue daily.  Marland Kitchen Fexofenadine HCl (ALLEGRA PO) Take 1 tablet by mouth daily.  . fish oil-omega-3 fatty acids 1000 MG capsule Take 1 g by mouth daily.  . folic acid (FOLVITE) 1 MG tablet Take 2 mg by mouth every morning.  . Multiple Vitamins-Minerals (MULTIVITAMIN GUMMIES  ADULT PO) Take 2 tablets by mouth daily.   Scheduled:    . buPROPion  450 mg Oral q morning - 10a  . cholecalciferol  2,000 Units Oral Daily  . citalopram  40 mg Oral q morning - 10a  . coumadin book   Does not apply Once  . docusate sodium  100 mg Oral BID  . ferrous sulfate  325 mg Oral BID WC  . folic acid  2 mg Oral q morning - 10a  . loratadine  10 mg Oral Daily  . vitamin B-12  1,000 mcg Oral Daily  . warfarin  5 mg Oral ONCE-1800  . warfarin   Does not apply Once  . Warfarin - Pharmacist Dosing Inpatient   Does not apply q1800    Assessment:  Today's INR is 2.24, however this does NOT indicate that the patient is fully anticoagulated.  It takes ~ 5 days for all the clotting factors associated with Coumadin to be affected.  No bleeding complications.  Goals:  Target INR of 2-3  Plan:  Will reduce Coumadin to 2 mg today following rapid rise of INR following 3 doses of Coumadin.    Daily INR's, CBC.  Prabhleen Montemayor, Elisha Headland, Pharm. D. 12/28/2011, 10:22 AM

## 2011-12-28 NOTE — Progress Notes (Signed)
Physical Therapy Treatment Patient Details Name: Lauren Huffman MRN: [id number] DOB: 1956-02-22 Today's Date: 12/28/2011 Time: 7829-5621 PT Time Calculation (min): 30 min  PT Assessment / Plan / Recommendation Comments on Treatment Session  Pt increasingly worried about placement after discharge.  Pt motivated to complete exercises and gait quality has improved.  Pt does not have assistance during the day and will benefit from SNF prior to d/c home.     Follow Up Recommendations  Skilled nursing facility (decreased caregiver support)    Barriers to Discharge        Equipment Recommendations  Defer to next venue    Recommendations for Other Services Other (comment) (None)  Frequency 7X/week   Plan Discharge plan remains appropriate    Precautions / Restrictions Precautions Precautions: Knee Restrictions Weight Bearing Restrictions: Yes RLE Weight Bearing: Weight bearing as tolerated   Pertinent Vitals/Pain 1/10 pain in R LE before exercises and increased during exercises.    Mobility  Bed Mobility Bed Mobility: Sit to Supine Sit to Supine: 4: Min guard Details for Bed Mobility Assistance: Cues for safety and LE placement Transfers Transfers: Sit to Stand;Stand to Sit Sit to Stand: 4: Min guard;With upper extremity assist;From chair/3-in-1;From toilet Stand to Sit: 4: Min guard;Without upper extremity assist;To bed;To chair/3-in-1;To toilet Details for Transfer Assistance: Cues for safety and proper technique Ambulation/Gait Ambulation/Gait Assistance: 4: Min guard Ambulation Distance (Feet): 100 Feet Assistive device: Rolling walker Ambulation/Gait Assistance Details: Cues for proper step sequence and posture Gait Pattern: Step-to pattern;Step-through pattern;Antalgic Gait velocity: decreased Stairs: No Wheelchair Mobility Wheelchair Mobility: No    Exercises Total Joint Exercises Ankle Circles/Pumps: AROM;Both;10 reps Quad Sets: AROM;Right;10 reps Short Arc QuadBarbaraann Boys;Right;5 reps Heel Slides: AAROM;Right;5 reps Hip ABduction/ADduction: AROM;Right;5 reps Straight Leg Raises: AAROM;Right;5 reps   PT Diagnosis:    PT Problem List:   PT Treatment Interventions:     PT Goals Acute Rehab PT Goals PT Goal Formulation: With patient Time For Goal Achievement: 01/02/12 Potential to Achieve Goals: Good Pt will go Sit to Supine/Side: with modified independence PT Goal: Sit to Supine/Side - Progress: Progressing toward goal Pt will go Sit to Stand: with modified independence PT Goal: Sit to Stand - Progress: Progressing toward goal Pt will go Stand to Sit: with modified independence PT Goal: Stand to Sit - Progress: Progressing toward goal Pt will Ambulate: >150 feet;with modified independence;with rolling walker PT Goal: Ambulate - Progress: Progressing toward goal Pt will Perform Home Exercise Program: with supervision, verbal cues required/provided PT Goal: Perform Home Exercise Program - Progress: Progressing toward goal  Visit Information  Last PT Received On: 12/28/11 Assistance Needed: +1    Subjective Data  Subjective: I am really worried about placement after discharge Patient Stated Goal: To go to [facility]   Cognition  Overall Cognitive Status: Appears within functional limits for tasks assessed/performed Arousal/Alertness: Awake/alert Orientation Level: Appears intact for tasks assessed Behavior During Session: [facility] for tasks performed    Balance     End of Session PT - End of Session Equipment Utilized During Treatment: Gait belt Activity Tolerance: Patient tolerated treatment well Patient left: in bed;with call bell/phone within reach (towel roll under ankle to facilitate extension) Nurse Communication: Mobility status   GP     DITOMMASO, AMY 12/28/2011, 9:57 AM Jake Shark, PT DPT [phone]

## 2011-12-28 NOTE — Progress Notes (Signed)
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   Patient's husband was at her bedside.  Patient was in good spirits and looked well rested.  We prayed with the patient and her husband before we left.  Patient seemed to enjoy the visit as much as we did.  Rutherford Nail Chaplain

## 2011-12-28 NOTE — Progress Notes (Addendum)
Visited pt as husband bedside. 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 We talked about her healing and plans to leave here and hopefully go to a rehab facility. Pt was in very good spirits and happy for the visit. We had prayer w/pt and husband and she thanked me for the visit.  Marjory Lies Chaplain

## 2011-12-28 NOTE — Progress Notes (Signed)
Insurance authorization received late this evening from [employer] for SNF at [facility]. The facility can accept patient tomorrow.  Met with patient and her husband- they were very pleased and he will transport her via car around 1 pm.  Lupita Leash T. [place] Pugh  [phone]

## 2011-12-29 ENCOUNTER — Encounter (HOSPITAL_COMMUNITY): Payer: Self-pay | Admitting: Orthopedic Surgery

## 2011-12-29 LAB — PROTIME-INR
INR: 2.41 — ABNORMAL HIGH (ref 0.00–1.49)
Prothrombin Time: 25.1 seconds — ABNORMAL HIGH (ref 11.6–15.2)

## 2011-12-29 MED ORDER — WARFARIN VIDEO
Freq: Once | Status: AC
  Administered 2011-12-29: 11:00:00

## 2011-12-29 NOTE — Progress Notes (Signed)
Subjective: 4 Days Post-Op Procedure(s) (LRB): COMPUTER ASSISTED TOTAL KNEE ARTHROPLASTY (Right) Patient reports pain as moderate.    Objective: Vital signs in last 24 hours: Temp:  [98.2 F (36.8 C)-98.4 F (36.9 C)] 98.4 F (36.9 C) (09/24 0525) Pulse Rate:  [85-98] 85  (09/24 0525) Resp:  [16-18] 18  (09/24 0525) BP: (127-131)/(62-73) 131/73 mmHg (09/24 0525) SpO2:  [95 %-96 %] 96 % (09/24 0525) Weight:  [115 kg (253 lb 8.5 oz)] 115 kg (253 lb 8.5 oz) (09/23 1009)  Intake/Output from previous day:   Intake/Output this shift:     Basename 12/28/11 0750 12/27/11 0625  HGB 10.0* 10.2*    Basename 12/28/11 0750 12/27/11 0625  WBC 11.5* 10.6*  RBC 3.44* 3.38*  HCT 31.2* 30.8*  PLT 247 221   No results found for this basename: NA:2,K:2,CL:2,CO2:2,BUN:2,CREATININE:2,GLUCOSE:2,CALCIUM:2 in the last 72 hours  Basename 12/29/11 0520 12/28/11 0750  LABPT -- --  INR 2.41* 2.24*    Neurologically intact ABD soft Neurovascular intact Sensation intact distally Intact pulses distally No cellulitis present Compartment soft  Assessment/Plan: 4 Days Post-Op Procedure(s) (LRB): COMPUTER ASSISTED TOTAL KNEE ARTHROPLASTY (Right) Advance diet Discharge to SNF as patient is unsafe at home currently  Baylin Gamblin L 12/29/2011, 8:19 AM

## 2011-12-29 NOTE — Discharge Summary (Signed)
Patient ID: Lauren Huffman MRN: [id number] DOB/AGE: 58-29-1957 58 y.o.  Admit date: 12/25/2011 Discharge date: 12/29/2011  Admission Diagnoses:  Principal Problem:  *Osteoarthritis of right knee Active Problems:  Obesity   Discharge Diagnoses:  Same  Past Medical History  Diagnosis Date  . Sleep apnea     DOES NOT WEAR CPAP    . Mental disorder   . Depression   . Headache     Surgeries: Procedure(s):right COMPUTER ASSISTED TOTAL KNEE ARTHROPLASTY on 12/25/2011   Consultants:  none  Discharged Condition: Improved  Hospital Course: Lauren Huffman is an 56 y.o. female who was admitted 12/25/2011 for operative treatment ofOsteoarthritis of right knee. Patient has severe unremitting pain that affects sleep, daily activities, and work/hobbies. After pre-op clearance the patient was taken to the operating room on 12/25/2011 and underwent  Procedure(s):right COMPUTER ASSISTED TOTAL KNEE ARTHROPLASTY.    Patient was given perioperative antibiotics: Anti-infectives     Start     Dose/Rate Route Frequency Ordered Stop   12/25/11 1400   ceFAZolin (ANCEF) IVPB 2 g/50 mL premix        2 g 100 mL/hr over 30 Minutes Intravenous Every 6 hours 12/25/11 1307 12/25/11 2030   12/25/11 0948   cefUROXime (ZINACEF) injection  Status:  Discontinued          As needed 12/25/11 0948 12/25/11 1026   12/24/11 1426   ceFAZolin (ANCEF) 3 g in dextrose 5 % 50 mL IVPB        3 g 160 mL/hr over 30 Minutes Intravenous 60 min pre-op 12/24/11 1426 12/25/11 0743           Patient was given sequential compression devices, early ambulation, and chemoprophylaxis to prevent DVT.  Patient benefited maximally from hospital stay and there were no complications.   She progressed very well with physical therapy.  She was felt to be a candidate for rehabilitation at a skilled nursing facility as she would be aligned much of the time at home.  We did have to wait for insurance approval for this. Recent vital signs:  Patient Vitals for the past 24 hrs:  BP Temp Pulse Resp SpO2 Height Weight  12/29/11 0525 131/73 mmHg 98.4 F (36.9 C) 85  18  96 % - -  01/22/12 2048 127/62 mmHg 98.2 F (36.8 C) 98  16  95 % - -  Jan 22, 2012 1009 - - - - - 5' 8.11" (1.73 m) 115 kg (253 lb 8.5 oz)     Recent laboratory studies:  Basename 12/29/11 0520 January 22, 2012 0750 12/27/11 0625  WBC -- 11.5* 10.6*  HGB -- 10.0* 10.2*  HCT -- 31.2* 30.8*  PLT -- 247 221  NA -- -- --  K -- -- --  CL -- -- --  CO2 -- -- --  BUN -- -- --  CREATININE -- -- --  GLUCOSE -- -- --  INR 2.41* 2.24* --  CALCIUM -- -- --     Discharge Medications:     Medication List     As of 12/29/2011  9:11 AM    STOP taking these medications         acetaminophen 500 MG tablet   Commonly known as: TYLENOL      naproxen sodium 220 MG tablet   Commonly known as: ANAPROX      TAKE these medications         ALLEGRA PO   Take 1 tablet by mouth daily.  buPROPion 150 MG 24 hr tablet   Commonly known as: WELLBUTRIN XL   Take 450 mg by mouth every morning.      CALCIUM GUMMIES PO   Take 1 tablet by mouth 2 (two) times daily.      citalopram 40 MG tablet   Commonly known as: CELEXA   Take 40 mg by mouth every morning.      fish oil-omega-3 fatty acids 1000 MG capsule   Take 1 g by mouth daily.      folic acid 1 MG tablet   Commonly known as: FOLVITE   Take 2 mg by mouth every morning.      methocarbamol 500 MG tablet   Commonly known as: ROBAXIN   Take 1 tablet (500 mg total) by mouth every 6 (six) hours as needed (spasm).      methocarbamol 500 MG tablet   Commonly known as: ROBAXIN   Take 1 tablet (500 mg total) by mouth 4 (four) times daily.      MULTIVITAMIN GUMMIES ADULT PO   Take 2 tablets by mouth daily.      oxyCODONE-acetaminophen 5-325 MG per tablet   Commonly known as: PERCOCET/ROXICET   Take 1-2 tablets by mouth every 4 (four) hours as needed for pain.      Vitamin B-12 1000 MCG Subl   Place 1,000 mcg under  the tongue daily.      Vitamin D-3 1000 UNITS Caps   Take 2,000 Units by mouth daily.      warfarin 5 MG tablet   Commonly known as: COUMADIN   One daily unless otherwise directed. Shoot for INR of 2.0. Treat x 1 month post op.      warfarin 5 MG tablet   Commonly known as: COUMADIN   Take 1 tablet (5 mg total) by mouth daily.        Diagnostic Studies: Dg Chest 2 View  12/17/2011  *RADIOLOGY REPORT*  Clinical Data: Preadmission for knee replacement  CHEST - 2 VIEW  Comparison: None.  Findings: Cardiomediastinal silhouette is unremarkable.  Mild elevation of the right hemidiaphragm.  No acute infiltrate or pleural effusion.  No pulmonary edema.  Mild degenerative changes mid thoracic spine.  IMPRESSION: No active disease.  Mild degenerative changes mid thoracic spine.   Original Report Authenticated By: Natasha Mead, M.D.     Disposition: skilled nursing facility      Discharge Orders    Future Orders Please Complete By Expires   Diet general      Call MD / Call 911      Comments:   If you experience chest pain or shortness of breath, CALL 911 and be transported to the hospital emergency room.  If you develope a fever above 101 F, pus (white drainage) or increased drainage or redness at the wound, or calf pain, call your surgeon's office.   Constipation Prevention      Comments:   Drink plenty of fluids.  Prune juice may be helpful.  You may use a stool softener, such as Colace (over the counter) 100 mg twice a day.  Use MiraLax (over the counter) for constipation as needed.   Increase activity slowly as tolerated      Weight bearing as tolerated      Do not put a pillow under the knee. Place it under the heel.      CPM      Comments:   Continuous passive motion machine (CPM):  Use the CPM from0 degrees to 60 degrees  for 8 hours per day.      You may increase by 5-10 degrees per day.  You may break it up into 2 or 3 sessions per day.      Use CPM for 1-2 weeks or until you  are told to stop.      Follow-up Information    Follow up with GRAVES,JOHN L, MD. Schedule an appointment as soon as possible for a visit in 2 weeks.   Contact information:   [address] [place] [address] [phone]           Signed: Matthew Folks 12/29/2011, 9:11 AM

## 2011-12-29 NOTE — Progress Notes (Signed)
ANTICOAGULATION CONSULT - COUMADIN  Pharmacy Consult for Coumadin Indication: VTE Prophylaxis s/p R-TKA  HPI: 56 y.o.female admitted for Degenerative joint disease who is currently on Coumadin for VTE prophylaxis s/p R-TKA.  Allergies: No Known Allergies  Height/Weight: Height: 5' 8.11" (173 cm) Weight: 253 lb 8.5 oz (115 kg) IBW/kg (Calculated) : 64.15   Vitals: Blood pressure 131/73, pulse 85, temperature 98.4 F (36.9 C), temperature source Oral, resp. rate 18, height 5' 8.11" (1.73 m), weight 253 lb 8.5 oz (115 kg), SpO2 96.00%.  Current active problems: Principal Problem:  *Osteoarthritis of right knee Active Problems:  Obesity   Medical / Surgical History: Diagnosis  . Sleep apnea  . Mental disorder  . Depression  . Headache   Past Surgical History  Procedure Date  . No past surgeries     Current Labs: Today INR = 2.41 12/29/11  Basename 12/28/11 0750 12/27/11 0625 12/26/11 0455  HGB 10.0* 10.2* --  HCT 31.2* 30.8* 29.8*  PLT 247 221 238  LABPROT 23.8* 18.8* 14.7  INR 2.24* 1.63* 1.17  CREATININE -- -- 0.72   Lab Results  Component Value Date   INR 2.41* 12/29/2011   INR 2.24* 12/28/2011   INR 1.63* 12/27/2011   Estimated Creatinine Clearance: 102.3 ml/min (by C-G formula based on Cr of 0.72).  Pertinent Medication History: Medication Sig  . buPROPion (WELLBUTRIN XL) 150 MG 24 hr tablet Take 450 mg by mouth every morning.  . Calcium-Phosphorus-Vitamin D (CALCIUM GUMMIES PO) Take 1 tablet by mouth 2 (two) times daily.  . Cholecalciferol (VITAMIN D-3) 1000 UNITS CAPS Take 2,000 Units by mouth daily.  . citalopram (CELEXA) 40 MG tablet Take 40 mg by mouth every morning.  . Cyanocobalamin (VITAMIN B-12) 1000 MCG SUBL Place 1,000 mcg under the tongue daily.  Marland Kitchen Fexofenadine HCl (ALLEGRA PO) Take 1 tablet by mouth daily.  . fish oil-omega-3 fatty acids 1000 MG capsule Take 1 g by mouth daily.  . folic acid (FOLVITE) 1 MG tablet Take 2 mg by mouth every  morning.  . Multiple Vitamins-Minerals (MULTIVITAMIN GUMMIES ADULT PO) Take 2 tablets by mouth daily.   Scheduled:     . buPROPion  450 mg Oral q morning - 10a  . cholecalciferol  2,000 Units Oral Daily  . citalopram  40 mg Oral q morning - 10a  . coumadin book   Does not apply Once  . docusate sodium  100 mg Oral BID  . ferrous sulfate  325 mg Oral BID WC  . folic acid  2 mg Oral q morning - 10a  . loratadine  10 mg Oral Daily  . vitamin B-12  1,000 mcg Oral Daily  . warfarin  2 mg Oral ONCE-1800  . warfarin   Does not apply Once  . Warfarin - Pharmacist Dosing Inpatient   Does not apply q1800    Assessment:  Today's INR is 2.41, therapeutic INR.  No bleeding complications.  Goals:  Target INR of 2-3 (discharge RX for coumadin indicates to target INR of 2.0)    Plan:  Patient is being discharged to SNF today. MD has dc'd on RX for Coumadin 5mg  tablets daily with adjustments per home health pharmacy or SNF physician/pharmacist. Dose is appropriate to target INR of 2.0  Draw next INR by Thurs. 9/26 and adjust dose per SNF or home health pharmacist/nurse . Continue INR monitoring at least once weekly while on coumadin (x 77month).   Noah Delaine, RPh Clinical Pharmacist Pager: [phone] 12/29/2011, 9:49 AM

## 2011-12-29 NOTE — Clinical Social Work Note (Signed)
Clinical Social Worker facilitated patient discharge including contacting facility and confirming discharge plans with patient and patient sister.  CSW received authorization from patient insurance for [facility].  Patient plans to travel by car with her sister to go to [facility] today.  RN to call report prior to patient discharge.  Clinical Social Worker will sign off for now as social work intervention is no longer needed. Please consult [place] again if new need arises.  Macario Golds, [place] [phone]

## 2011-12-29 NOTE — Progress Notes (Signed)
Physical Therapy Treatment Patient Details Name: Lauren Huffman MRN: [id number] DOB: November 14, 1955 Today's Date: 12/29/2011 Time: 7829-5621 PT Time Calculation (min): 23 min  PT Assessment / Plan / Recommendation Comments on Treatment Session  Pt cont's to progress with mobility & PT goals.  Eager to d/c to Lauren Huffman today.      Follow Up Recommendations  Skilled nursing facility    Barriers to Discharge        Equipment Recommendations  Defer to next venue;3 in 1 bedside comode    Recommendations for Other Services    Frequency 7X/week   Plan Discharge plan remains appropriate    Precautions / Restrictions Precautions Precautions: Knee Restrictions Weight Bearing Restrictions: Yes RLE Weight Bearing: Weight bearing as tolerated    Pertinent Vitals/Pain 4/10 at beginning of session.  Pain increased with activity but did not rate.      Mobility  Bed Mobility Bed Mobility: Not assessed Transfers Transfers: Sit to Stand;Stand to Sit Sit to Stand: 5: Supervision;With upper extremity assist;With armrests;From chair/3-in-1 Stand to Sit: 5: Supervision;With upper extremity assist;With armrests;To chair/3-in-1 Details for Transfer Assistance: cues for use of UE's to control descent Ambulation/Gait Ambulation/Gait Assistance: 4: Min guard Ambulation Distance (Feet): 100 Feet Assistive device: Rolling walker Ambulation/Gait Assistance Details: Cues to not step so close to front of RW, decrease reliance of UE's on RW.  Focus was to increase fluidity of sequencing.   Gait Pattern: Step-to pattern;Decreased stance time - right;Decreased step length - left Gait velocity: decreased    Exercises Total Joint Exercises Ankle Circles/Pumps: AROM;Both;15 reps Quad Sets: AROM;Both;15 reps Hip ABduction/ADduction: AAROM;Right;15 reps Straight Leg Raises: AAROM;Right;15 reps Long Arc Quad: AROM;Right;15 reps Knee Flexion: AAROM;Right;10 reps     PT Goals Acute Rehab PT Goals Time For  Goal Achievement: 01/02/12 Potential to Achieve Goals: Good PT Goal: Sit to Stand - Progress: Progressing toward goal PT Goal: Stand to Sit - Progress: Progressing toward goal PT Goal: Ambulate - Progress: Progressing toward goal PT Goal: Perform Home Exercise Program - Progress: Progressing toward goal  Visit Information  Last PT Received On: 12/29/11 Assistance Needed: +1    Subjective Data      Cognition  Overall Cognitive Status: Appears within functional limits for tasks assessed/performed Arousal/Alertness: Awake/alert Orientation Level: Appears intact for tasks assessed Behavior During Session: [facility] for tasks performed    Balance     End of Session PT - End of Session Equipment Utilized During Treatment: Gait belt Activity Tolerance: Patient tolerated treatment well Patient left: in chair;with call bell/phone within reach Nurse Communication: Mobility status CPM Right Knee CPM Right Knee: [address]    [place], [place] [phone] 12/29/2011

## 2012-05-12 ENCOUNTER — Other Ambulatory Visit: Payer: Self-pay | Admitting: Gastroenterology

## 2012-06-17 ENCOUNTER — Other Ambulatory Visit: Payer: Self-pay | Admitting: Orthopedic Surgery

## 2012-06-21 ENCOUNTER — Other Ambulatory Visit: Payer: Self-pay | Admitting: Orthopedic Surgery

## 2012-06-29 ENCOUNTER — Encounter (HOSPITAL_COMMUNITY): Payer: Self-pay

## 2012-06-30 ENCOUNTER — Encounter (HOSPITAL_COMMUNITY): Payer: Self-pay

## 2012-06-30 ENCOUNTER — Encounter (HOSPITAL_COMMUNITY)
Admission: RE | Admit: 2012-06-30 | Discharge: 2012-06-30 | Disposition: A | Discharge status: Home / Self Care | Payer: No Typology Code available for payment source | Source: Ambulatory Visit | Attending: Orthopedic Surgery | Admitting: Orthopedic Surgery

## 2012-06-30 HISTORY — DX: Unspecified osteoarthritis, unspecified site: M19.90

## 2012-06-30 LAB — CBC WITH DIFFERENTIAL/PLATELET
Basophils Absolute: 0 10*3/uL (ref 0.0–0.1)
Eosinophils Absolute: 0.2 10*3/uL (ref 0.0–0.7)
Eosinophils Relative: 2 % (ref 0–5)
MCH: 28.5 pg (ref 26.0–34.0)
MCV: 86.9 fL (ref 78.0–100.0)
Platelets: 246 10*3/uL (ref 150–400)
RDW: 15.4 % (ref 11.5–15.5)

## 2012-06-30 LAB — COMPREHENSIVE METABOLIC PANEL
ALT: 12 U/L (ref 0–35)
AST: 15 U/L (ref 0–37)
Calcium: 9.2 mg/dL (ref 8.4–10.5)
Creatinine, Ser: 0.73 mg/dL (ref 0.50–1.10)
GFR calc Af Amer: >90 mL/min (ref >90)
Glucose, Bld: 82 mg/dL (ref 70–99)
Sodium: 142 mEq/L (ref 135–145)
Total Protein: 6.6 g/dL (ref 6.0–8.3)

## 2012-06-30 LAB — PROTIME-INR: INR: 0.97 (ref 0.00–1.49)

## 2012-06-30 LAB — URINALYSIS, ROUTINE W REFLEX MICROSCOPIC
Hgb urine dipstick: NEGATIVE
Ketones, ur: NEGATIVE mg/dL
Protein, ur: NEGATIVE mg/dL
Urobilinogen, UA: 0.2 mg/dL (ref 0.0–1.0)

## 2012-06-30 LAB — TYPE AND SCREEN
ABO/RH(D): O POS
Antibody Screen: NEGATIVE

## 2012-06-30 LAB — SURGICAL PCR SCREEN: MRSA, PCR: NEGATIVE

## 2012-06-30 NOTE — Pre-Procedure Instructions (Addendum)
Lauren Huffman  06/30/2012   Your procedure is scheduled on:  07-08-2012 Take the [facility] to the 3rd floor  Report to [facility] Stay [facility] at 8:30 AM.  Call this number if you have problems the morning of surgery: [phone]   Remember:   Do not eat food or drink liquids after midnight.   Take these medicines the morning of surgery with A SIP OF WATER: bupropion(Wellbutrin),citalopram(Celexa),fexofenadine(Allegra),folic acid   Do not wear jewelry, make-up or nail polish.  Do not wear lotions, powders, or perfumes. You may wear deodorant.  Do not shave 48 hours prior to surgery.   Do not bring valuables to the hospital.  Contacts, dentures or bridgework may not be worn into surgery.  Leave suitcase in the car. After surgery it may be brought to your room.   For patients admitted to the hospital, checkout time is 11:00 AM the day of discharge.   Patients discharged the day of surgery will not be allowed to drive home.    Special Instructions: Shower using CHG 2 nights before surgery and the night before surgery.  If you shower the day of surgery use CHG.  Use special wash - you have one bottle of CHG for all showers.  You should use approximately 1/3 of the bottle for each shower.   Please read over the following fact sheets that you were given: Pain Booklet, Coughing and Deep Breathing, Blood Transfusion Information and Surgical Site Infection Prevention

## 2012-07-07 MED ORDER — CEFAZOLIN SODIUM-DEXTROSE 2-3 GM-% IV SOLR
2.0000 g | INTRAVENOUS | Status: AC
  Administered 2012-07-08: 2 g via INTRAVENOUS
  Filled 2012-07-07: qty 50

## 2012-07-08 ENCOUNTER — Encounter (HOSPITAL_COMMUNITY): Payer: Self-pay | Admitting: Anesthesiology

## 2012-07-08 ENCOUNTER — Inpatient Hospital Stay (HOSPITAL_COMMUNITY): Payer: No Typology Code available for payment source | Admitting: Anesthesiology

## 2012-07-08 ENCOUNTER — Encounter (HOSPITAL_COMMUNITY)
Admission: RE | Disposition: A | Discharge status: Home Health | Payer: Self-pay | Source: Ambulatory Visit | Attending: Orthopedic Surgery

## 2012-07-08 ENCOUNTER — Encounter (HOSPITAL_COMMUNITY): Payer: Self-pay | Admitting: *Deleted

## 2012-07-08 ENCOUNTER — Inpatient Hospital Stay (HOSPITAL_COMMUNITY)
Admission: RE | Admit: 2012-07-08 | Discharge: 2012-07-11 | DRG: 470 | Disposition: A | Discharge status: Home Health | Payer: No Typology Code available for payment source | Source: Ambulatory Visit | Attending: Orthopedic Surgery | Admitting: Orthopedic Surgery

## 2012-07-08 DIAGNOSIS — Z01812 Encounter for preprocedural laboratory examination: Secondary | ICD-10-CM

## 2012-07-08 DIAGNOSIS — F329 Major depressive disorder, single episode, unspecified: Secondary | ICD-10-CM | POA: Diagnosis present

## 2012-07-08 DIAGNOSIS — G4730 Sleep apnea, unspecified: Secondary | ICD-10-CM | POA: Diagnosis present

## 2012-07-08 DIAGNOSIS — F3289 Other specified depressive episodes: Secondary | ICD-10-CM | POA: Diagnosis present

## 2012-07-08 DIAGNOSIS — M1710 Unilateral primary osteoarthritis, unspecified knee: Principal | ICD-10-CM | POA: Diagnosis present

## 2012-07-08 DIAGNOSIS — M1712 Unilateral primary osteoarthritis, left knee: Secondary | ICD-10-CM | POA: Diagnosis present

## 2012-07-08 DIAGNOSIS — Z6837 Body mass index (BMI) 37.0-37.9, adult: Secondary | ICD-10-CM

## 2012-07-08 DIAGNOSIS — E669 Obesity, unspecified: Secondary | ICD-10-CM | POA: Diagnosis present

## 2012-07-08 DIAGNOSIS — IMO0002 Reserved for concepts with insufficient information to code with codable children: Principal | ICD-10-CM | POA: Diagnosis present

## 2012-07-08 HISTORY — PX: TOTAL KNEE ARTHROPLASTY: SHX125

## 2012-07-08 HISTORY — PX: KNEE ARTHROPLASTY: SHX992

## 2012-07-08 SURGERY — ARTHROPLASTY, KNEE, TOTAL, USING IMAGELESS COMPUTER-ASSISTED NAVIGATION
Anesthesia: Regional | Site: Knee | Laterality: Left | Wound class: Clean

## 2012-07-08 MED ORDER — DEXTROSE 5 % IV SOLN: 500.0000 mg | Freq: Four times a day (QID) | INTRAVENOUS | Status: DC | PRN

## 2012-07-08 MED ORDER — CEFAZOLIN SODIUM-DEXTROSE 2-3 GM-% IV SOLR
2.0000 g | Freq: Four times a day (QID) | INTRAVENOUS | Status: AC
  Administered 2012-07-08 – 2012-07-09 (×2): 2 g via INTRAVENOUS
  Filled 2012-07-08 (×2): qty 50

## 2012-07-08 MED ORDER — LACTATED RINGERS IV SOLN
INTRAVENOUS | Status: DC | PRN
  Administered 2012-07-08 (×3): via INTRAVENOUS

## 2012-07-08 MED ORDER — KETOROLAC TROMETHAMINE 15 MG/ML IJ SOLN
15.0000 mg | Freq: Four times a day (QID) | INTRAMUSCULAR | Status: AC
  Administered 2012-07-09 (×4): 15 mg via INTRAVENOUS
  Filled 2012-07-08 (×4): qty 1

## 2012-07-08 MED ORDER — POVIDONE-IODINE 7.5 % EX SOLN: Freq: Once | CUTANEOUS | Status: DC

## 2012-07-08 MED ORDER — DEXAMETHASONE SODIUM PHOSPHATE 4 MG/ML IJ SOLN
INTRAMUSCULAR | Status: DC | PRN
  Administered 2012-07-08: 10 mg

## 2012-07-08 MED ORDER — DOCUSATE SODIUM 100 MG PO CAPS
100.0000 mg | ORAL_CAPSULE | Freq: Two times a day (BID) | ORAL | Status: DC
Start: 2012-07-08 — End: 2012-07-11
  Administered 2012-07-08 – 2012-07-11 (×6): 100 mg via ORAL
  Filled 2012-07-08 (×8): qty 1

## 2012-07-08 MED ORDER — CEFUROXIME SODIUM 1.5 G IJ SOLR
INTRAMUSCULAR | Status: DC | PRN
  Administered 2012-07-08: 1.5 g

## 2012-07-08 MED ORDER — HYDROMORPHONE HCL PF 1 MG/ML IJ SOLN
INTRAMUSCULAR | Status: DC | PRN
  Administered 2012-07-08: 1 mg via INTRAVENOUS

## 2012-07-08 MED ORDER — PROMETHAZINE HCL 25 MG/ML IJ SOLN: 6.2500 mg | INTRAMUSCULAR | Status: DC | PRN

## 2012-07-08 MED ORDER — OXYCODONE HCL 5 MG PO TABS: 5.0000 mg | ORAL_TABLET | Freq: Once | ORAL | Status: DC | PRN

## 2012-07-08 MED ORDER — FERROUS SULFATE 325 (65 FE) MG PO TABS
325.0000 mg | ORAL_TABLET | Freq: Every day | ORAL | Status: DC
  Administered 2012-07-09 – 2012-07-11 (×3): 325 mg via ORAL
  Filled 2012-07-08 (×4): qty 1

## 2012-07-08 MED ORDER — HYDROMORPHONE HCL PF 1 MG/ML IJ SOLN
1.0000 mg | INTRAMUSCULAR | Status: DC | PRN
  Administered 2012-07-08 (×2): 1 mg via INTRAVENOUS
  Filled 2012-07-08 (×3): qty 1

## 2012-07-08 MED ORDER — OXYCODONE HCL 5 MG PO TABS
5.0000 mg | ORAL_TABLET | ORAL | Status: DC | PRN
Start: 2012-07-08 — End: 2012-07-11
  Administered 2012-07-08 – 2012-07-11 (×13): 10 mg via ORAL
  Administered 2012-07-11 (×2): 5 mg via ORAL
  Filled 2012-07-08 (×14): qty 2

## 2012-07-08 MED ORDER — FENTANYL CITRATE 0.05 MG/ML IJ SOLN
INTRAMUSCULAR | Status: DC | PRN
  Administered 2012-07-08: 100 ug via INTRAVENOUS
  Administered 2012-07-08: 50 ug via INTRAVENOUS
  Administered 2012-07-08: 100 ug via INTRAVENOUS

## 2012-07-08 MED ORDER — ACETAMINOPHEN 10 MG/ML IV SOLN
INTRAVENOUS | Status: AC
  Administered 2012-07-08: 1000 mg via INTRAVENOUS
  Filled 2012-07-08: qty 100

## 2012-07-08 MED ORDER — VITAMIN B-12 1000 MCG PO TABS
1000.0000 ug | ORAL_TABLET | Freq: Every day | ORAL | Status: DC
  Administered 2012-07-08: 1000 ug via ORAL
  Filled 2012-07-08 (×4): qty 1

## 2012-07-08 MED ORDER — NEOSTIGMINE METHYLSULFATE 1 MG/ML IJ SOLN
INTRAMUSCULAR | Status: DC | PRN
  Administered 2012-07-08: 3 mg via INTRAVENOUS

## 2012-07-08 MED ORDER — METHOCARBAMOL 500 MG PO TABS
500.0000 mg | ORAL_TABLET | Freq: Four times a day (QID) | ORAL | Status: DC | PRN
  Administered 2012-07-08 – 2012-07-11 (×8): 500 mg via ORAL
  Filled 2012-07-08 (×9): qty 1

## 2012-07-08 MED ORDER — ONDANSETRON HCL 4 MG/2ML IJ SOLN
INTRAMUSCULAR | Status: DC | PRN
  Administered 2012-07-08: 4 mg via INTRAVENOUS

## 2012-07-08 MED ORDER — OXYCODONE HCL 5 MG/5ML PO SOLN: 5.0000 mg | Freq: Once | ORAL | Status: DC | PRN

## 2012-07-08 MED ORDER — ACETAMINOPHEN 10 MG/ML IV SOLN
1000.0000 mg | Freq: Four times a day (QID) | INTRAVENOUS | Status: AC
  Administered 2012-07-08 – 2012-07-09 (×4): 1000 mg via INTRAVENOUS
  Filled 2012-07-08 (×4): qty 100

## 2012-07-08 MED ORDER — FENTANYL CITRATE 0.05 MG/ML IJ SOLN
INTRAMUSCULAR | Status: AC
  Administered 2012-07-08: 100 ug
  Filled 2012-07-08: qty 2

## 2012-07-08 MED ORDER — ROCURONIUM BROMIDE 100 MG/10ML IV SOLN
INTRAVENOUS | Status: DC | PRN
  Administered 2012-07-08: 50 mg via INTRAVENOUS

## 2012-07-08 MED ORDER — ASPIRIN EC 325 MG PO TBEC
325.0000 mg | DELAYED_RELEASE_TABLET | Freq: Two times a day (BID) | ORAL | Status: DC
  Administered 2012-07-08 – 2012-07-11 (×6): 325 mg via ORAL
  Filled 2012-07-08 (×8): qty 1

## 2012-07-08 MED ORDER — PROPOFOL 10 MG/ML IV BOLUS
INTRAVENOUS | Status: DC | PRN
  Administered 2012-07-08: 200 mg via INTRAVENOUS

## 2012-07-08 MED ORDER — DIPHENHYDRAMINE HCL 12.5 MG/5ML PO ELIX
12.5000 mg | ORAL_SOLUTION | ORAL | Status: DC | PRN
  Administered 2012-07-09 – 2012-07-10 (×2): 25 mg via ORAL
  Filled 2012-07-08 (×2): qty 10

## 2012-07-08 MED ORDER — PROMETHAZINE HCL 25 MG/ML IJ SOLN: 12.5000 mg | Freq: Four times a day (QID) | INTRAMUSCULAR | Status: DC | PRN

## 2012-07-08 MED ORDER — DEXTROSE-NACL 5-0.45 % IV SOLN
INTRAVENOUS | Status: DC
  Administered 2012-07-09 (×2): via INTRAVENOUS

## 2012-07-08 MED ORDER — VITAMIN B-12 1000 MCG SL SUBL: 1000.0000 ug | SUBLINGUAL_TABLET | Freq: Every day | SUBLINGUAL | Status: DC

## 2012-07-08 MED ORDER — POLYETHYLENE GLYCOL 3350 17 G PO PACK: 17.0000 g | PACK | Freq: Every day | ORAL | Status: DC | PRN

## 2012-07-08 MED ORDER — ONDANSETRON HCL 4 MG PO TABS: 4.0000 mg | ORAL_TABLET | Freq: Four times a day (QID) | ORAL | Status: DC | PRN

## 2012-07-08 MED ORDER — CITALOPRAM HYDROBROMIDE 40 MG PO TABS
40.0000 mg | ORAL_TABLET | Freq: Every morning | ORAL | Status: DC
  Administered 2012-07-09 – 2012-07-11 (×3): 40 mg via ORAL
  Filled 2012-07-08 (×3): qty 1

## 2012-07-08 MED ORDER — ALUM & MAG HYDROXIDE-SIMETH 200-200-20 MG/5ML PO SUSP: 30.0000 mL | ORAL | Status: DC | PRN

## 2012-07-08 MED ORDER — LACTATED RINGERS IV SOLN
INTRAVENOUS | Status: DC
  Administered 2012-07-08: 10:00:00 via INTRAVENOUS

## 2012-07-08 MED ORDER — PHENYLEPHRINE HCL 10 MG/ML IJ SOLN
INTRAMUSCULAR | Status: DC | PRN
  Administered 2012-07-08 (×2): 80 ug via INTRAVENOUS

## 2012-07-08 MED ORDER — BUPROPION HCL ER (XL) 300 MG PO TB24
450.0000 mg | ORAL_TABLET | Freq: Every morning | ORAL | Status: DC
  Administered 2012-07-09 – 2012-07-11 (×3): 450 mg via ORAL
  Filled 2012-07-08 (×3): qty 1

## 2012-07-08 MED ORDER — GLYCOPYRROLATE 0.2 MG/ML IJ SOLN
INTRAMUSCULAR | Status: DC | PRN
  Administered 2012-07-08: 0.4 mg via INTRAVENOUS

## 2012-07-08 MED ORDER — MIDAZOLAM HCL 2 MG/2ML IJ SOLN
INTRAMUSCULAR | Status: AC
  Administered 2012-07-08: 2 mg
  Filled 2012-07-08: qty 2

## 2012-07-08 MED ORDER — BUPIVACAINE-EPINEPHRINE PF 0.5-1:200000 % IJ SOLN
INTRAMUSCULAR | Status: DC | PRN
  Administered 2012-07-08: 150 mg

## 2012-07-08 MED ORDER — ONDANSETRON HCL 4 MG/2ML IJ SOLN: 4.0000 mg | Freq: Four times a day (QID) | INTRAMUSCULAR | Status: DC | PRN

## 2012-07-08 MED ORDER — CEFUROXIME SODIUM 1.5 G IJ SOLR
INTRAMUSCULAR | Status: AC
  Filled 2012-07-08: qty 1.5

## 2012-07-08 MED ORDER — LORATADINE 10 MG PO TABS
10.0000 mg | ORAL_TABLET | Freq: Every day | ORAL | Status: DC
  Administered 2012-07-08 – 2012-07-11 (×4): 10 mg via ORAL
  Filled 2012-07-08 (×4): qty 1

## 2012-07-08 MED ORDER — SODIUM CHLORIDE 0.9 % IR SOLN
Status: DC | PRN
  Administered 2012-07-08: 3000 mL

## 2012-07-08 MED ORDER — ZOLPIDEM TARTRATE 5 MG PO TABS: 5.0000 mg | ORAL_TABLET | Freq: Every evening | ORAL | Status: DC | PRN

## 2012-07-08 MED ORDER — HYDROMORPHONE HCL PF 1 MG/ML IJ SOLN: 0.2500 mg | INTRAMUSCULAR | Status: DC | PRN

## 2012-07-08 MED ORDER — OXYCODONE-ACETAMINOPHEN 5-325 MG PO TABS: 1.0000 | ORAL_TABLET | Freq: Four times a day (QID) | ORAL | Status: DC | PRN

## 2012-07-08 MED ORDER — PANTOPRAZOLE SODIUM 40 MG PO TBEC
40.0000 mg | DELAYED_RELEASE_TABLET | Freq: Every day | ORAL | Status: DC
  Administered 2012-07-08 – 2012-07-11 (×3): 40 mg via ORAL
  Filled 2012-07-08 (×3): qty 1

## 2012-07-08 SURGICAL SUPPLY — 69 items
APL SKNCLS STERI-STRIP NONHPOA (GAUZE/BANDAGES/DRESSINGS) ×1
BANDAGE ELASTIC 4 VELCRO ST LF (GAUZE/BANDAGES/DRESSINGS) ×2 IMPLANT
BANDAGE ELASTIC 6 VELCRO ST LF (GAUZE/BANDAGES/DRESSINGS) ×1 IMPLANT
BANDAGE ESMARK 6X9 LF (GAUZE/BANDAGES/DRESSINGS) ×1 IMPLANT
BENZOIN TINCTURE PRP APPL 2/3 (GAUZE/BANDAGES/DRESSINGS) ×2 IMPLANT
BLADE SAGITTAL 25.0X1.19X90 (BLADE) ×2 IMPLANT
BLADE SAW SAG 90X13X1.27 (BLADE) ×2 IMPLANT
BNDG CMPR 9X6 STRL LF SNTH (GAUZE/BANDAGES/DRESSINGS) ×1
BNDG ESMARK 6X9 LF (GAUZE/BANDAGES/DRESSINGS) ×2
BOWL SMART MIX CTS (DISPOSABLE) ×2 IMPLANT
CEMENT HV SMART SET (Cement) ×4 IMPLANT
CLOTH BEACON ORANGE TIMEOUT ST (SAFETY) ×2 IMPLANT
CLSR STERI-STRIP ANTIMIC 1/2X4 (GAUZE/BANDAGES/DRESSINGS) ×1 IMPLANT
COVER BACK TABLE 24X17X13 BIG (DRAPES) IMPLANT
COVER SURGICAL LIGHT HANDLE (MISCELLANEOUS) ×2 IMPLANT
CUFF TOURNIQUET SINGLE 34IN LL (TOURNIQUET CUFF) ×2 IMPLANT
CUFF TOURNIQUET SINGLE 44IN (TOURNIQUET CUFF) IMPLANT
DRAPE EXTREMITY T 121X128X90 (DRAPE) ×2 IMPLANT
DRAPE U-SHAPE 47X51 STRL (DRAPES) ×2 IMPLANT
DRSG PAD ABDOMINAL 8X10 ST (GAUZE/BANDAGES/DRESSINGS) ×2 IMPLANT
DURAPREP 26ML APPLICATOR (WOUND CARE) ×2 IMPLANT
ELECT REM PT RETURN 9FT ADLT (ELECTROSURGICAL) ×2
ELECTRODE REM PT RTRN 9FT ADLT (ELECTROSURGICAL) ×1 IMPLANT
EVACUATOR 1/8 PVC DRAIN (DRAIN) ×2 IMPLANT
FACESHIELD LNG OPTICON STERILE (SAFETY) ×2 IMPLANT
GAUZE XEROFORM 5X9 LF (GAUZE/BANDAGES/DRESSINGS) ×2 IMPLANT
GLOVE BIOGEL PI IND STRL 8 (GLOVE) ×2 IMPLANT
GLOVE BIOGEL PI INDICATOR 8 (GLOVE) ×2
GLOVE ECLIPSE 7.5 STRL STRAW (GLOVE) ×4 IMPLANT
GOWN PREVENTION PLUS XLARGE (GOWN DISPOSABLE) ×2 IMPLANT
GOWN SRG XL XLNG 56XLVL 4 (GOWN DISPOSABLE) ×1 IMPLANT
GOWN STRL NON-REIN LRG LVL3 (GOWN DISPOSABLE) ×2 IMPLANT
GOWN STRL NON-REIN XL XLG LVL4 (GOWN DISPOSABLE) ×2
HANDPIECE INTERPULSE COAX TIP (DISPOSABLE) ×2
HOOD PEEL AWAY FACE SHEILD DIS (HOOD) ×6 IMPLANT
IMMOBILIZER KNEE 20 (SOFTGOODS)
IMMOBILIZER KNEE 20 THIGH 36 (SOFTGOODS) IMPLANT
IMMOBILIZER KNEE 22 UNIV (SOFTGOODS) ×2 IMPLANT
IMMOBILIZER KNEE 24 THIGH 36 (MISCELLANEOUS) IMPLANT
IMMOBILIZER KNEE 24 UNIV (MISCELLANEOUS)
KIT BASIN OR (CUSTOM PROCEDURE TRAY) ×2 IMPLANT
KIT ROOM TURNOVER OR (KITS) ×2 IMPLANT
MANIFOLD NEPTUNE II (INSTRUMENTS) ×2 IMPLANT
MARKER SPHERE PSV REFLC THRD 5 (MARKER) ×6 IMPLANT
NDL HYPO 25GX1X1/2 BEV (NEEDLE) IMPLANT
NEEDLE HYPO 25GX1X1/2 BEV (NEEDLE) IMPLANT
NS IRRIG 1000ML POUR BTL (IV SOLUTION) ×2 IMPLANT
PACK TOTAL JOINT (CUSTOM PROCEDURE TRAY) ×2 IMPLANT
PAD ARMBOARD 7.5X6 YLW CONV (MISCELLANEOUS) ×4 IMPLANT
PAD CAST 4YDX4 CTTN HI CHSV (CAST SUPPLIES) ×1 IMPLANT
PADDING CAST COTTON 4X4 STRL (CAST SUPPLIES) ×2
PADDING CAST COTTON 6X4 STRL (CAST SUPPLIES) ×1 IMPLANT
PIN SCHANZ 4MM 130MM (PIN) ×4 IMPLANT
SET HNDPC FAN SPRY TIP SCT (DISPOSABLE) ×1 IMPLANT
SPONGE GAUZE 4X4 12PLY (GAUZE/BANDAGES/DRESSINGS) ×2 IMPLANT
STAPLER VISISTAT 35W (STAPLE) IMPLANT
STRIP CLOSURE SKIN 1/2X4 (GAUZE/BANDAGES/DRESSINGS) ×2 IMPLANT
SUCTION FRAZIER TIP 10 FR DISP (SUCTIONS) ×2 IMPLANT
SUT MON AB 3-0 SH 27 (SUTURE) ×2
SUT MON AB 3-0 SH27 (SUTURE) IMPLANT
SUT VIC AB 0 CTB1 27 (SUTURE) ×4 IMPLANT
SUT VIC AB 1 CT1 27 (SUTURE) ×4
SUT VIC AB 1 CT1 27XBRD ANBCTR (SUTURE) ×2 IMPLANT
SUT VIC AB 2-0 CTB1 (SUTURE) ×4 IMPLANT
SYR CONTROL 10ML LL (SYRINGE) IMPLANT
TOWEL OR 17X24 6PK STRL BLUE (TOWEL DISPOSABLE) ×2 IMPLANT
TOWEL OR 17X26 10 PK STRL BLUE (TOWEL DISPOSABLE) ×2 IMPLANT
TRAY FOLEY CATH 14FR (SET/KITS/TRAYS/PACK) ×2 IMPLANT
WATER STERILE IRR 1000ML POUR (IV SOLUTION) ×6 IMPLANT

## 2012-07-08 NOTE — Progress Notes (Signed)
Patient monitored continuously during block

## 2012-07-08 NOTE — Progress Notes (Signed)
Utilization review completed. Catalaya Garr, RN, BSN. 

## 2012-07-08 NOTE — Progress Notes (Signed)
Orthopedic Tech Progress Note Patient Details:  Lauren Huffman [birth year] [id number] CPM applied to Left LE with appropriate settings. OHF applied to bed.  CPM Left Knee CPM Left Knee: On Left Knee Flexion (Degrees): 60 Left Knee Extension (Degrees): 0   [name] 07/08/2012, 2:55 PM

## 2012-07-08 NOTE — Transfer of Care (Signed)
Immediate Anesthesia Transfer of Care Note  Patient: Lauren Huffman  Procedure(s) Performed: Procedure(s) with comments: COMPUTER ASSISTED TOTAL KNEE ARTHROPLASTY (Left) - PRE OP FEMORAL NERVE BLOCK  Patient Location: PACU  Anesthesia Type:General and GA combined with regional for post-op pain  Level of Consciousness: awake, alert , oriented and patient cooperative  Airway & Oxygen Therapy: Patient Spontanous Breathing and Patient connected to nasal cannula oxygen  Post-op Assessment: Report given to PACU RN, Post -op Vital signs reviewed and stable and Patient moving all extremities  Post vital signs: Reviewed and stable  Complications: No apparent anesthesia complications

## 2012-07-08 NOTE — Anesthesia Procedure Notes (Signed)
Anesthesia Regional Block:  Femoral nerve block  Pre-Anesthetic Checklist: ,, timeout performed, Correct Patient, Correct Site, Correct Laterality, Correct Procedure, Correct Position, site marked, Risks and benefits discussed,  Surgical consent,  Pre-op evaluation,  At surgeon's request and post-op pain management  Laterality: Left  Prep: chloraprep       Needles:  Injection technique: Single-shot  Needle Type: Echogenic Stimulator Needle     Needle Length: 5cm 5 cm Needle Gauge: 22 and 22 G    Additional Needles:  Procedures: ultrasound guided (picture in chart) and nerve stimulator Femoral nerve block  Nerve Stimulator or Paresthesia:  Response: quadraceps contraction, 0.45 mA,   Additional Responses:   Narrative:  Start time: 07/08/2012 10:20 AM End time: 07/08/2012 10:30 AM Injection made incrementally with aspirations every 5 mL.  Performed by: Personally  Anesthesiologist: Halford Decamp, MD  Additional Notes: Functioning IV was confirmed and monitors were applied.  A 50mm 22ga Arrow echogenic stimulator needle was used. Sterile prep and drape,hand hygiene and sterile gloves were used. Ultrasound guidance: relevant anatomy identified, needle position confirmed, local anesthetic spread visualized around nerve(s)., vascular puncture avoided.  Image printed for medical record. Negative aspiration and negative test dose prior to incremental administration of local anesthetic. The patient tolerated the procedure well.    Femoral nerve block

## 2012-07-08 NOTE — Anesthesia Preprocedure Evaluation (Addendum)
Anesthesia Evaluation  Patient identified by MRN, date of birth, ID band Patient awake    Reviewed: Allergy & Precautions, H&P , NPO status , Patient's Chart, lab work & pertinent test results  History of Anesthesia Complications Negative for: history of anesthetic complications  Airway Mallampati: II TM Distance: >3 FB Neck ROM: Full    Dental  (+) Teeth Intact and Dental Advisory Given   Pulmonary sleep apnea ,    Pulmonary exam normal       Cardiovascular negative cardio ROS      Neuro/Psych  Headaches, PSYCHIATRIC DISORDERS Depression    GI/Hepatic negative GI ROS, Neg liver ROS,   Endo/Other  Morbid obesity  Renal/GU negative Renal ROS     Musculoskeletal   Abdominal   Peds  Hematology   Anesthesia Other Findings   Reproductive/Obstetrics                          Anesthesia Physical Anesthesia Plan  ASA: III  Anesthesia Plan: General   Post-op Pain Management:    Induction: Intravenous  Airway Management Planned: LMA and Oral ETT  Additional Equipment:   Intra-op Plan:   Post-operative Plan: Extubation in OR  Informed Consent: I have reviewed the patients History and Physical, chart, labs and discussed the procedure including the risks, benefits and alternatives for the proposed anesthesia with the patient or authorized representative who has indicated his/her understanding and acceptance.   Dental advisory given  Plan Discussed with: CRNA, Anesthesiologist and Surgeon  Anesthesia Plan Comments:        Anesthesia Quick Evaluation

## 2012-07-08 NOTE — Brief Op Note (Signed)
07/08/2012  12:50 PM  PATIENT:  Lauren Huffman  57 y.o. female  PRE-OPERATIVE DIAGNOSIS:  degenerative joint disease  POST-OPERATIVE DIAGNOSIS:  degenerative joint disease  PROCEDURE:  Procedure(s) with comments: COMPUTER ASSISTED TOTAL KNEE ARTHROPLASTY (Left) - PRE OP FEMORAL NERVE BLOCK  SURGEON:  Surgeon(s) and Role:    * [name], MD - Primary  PHYSICIAN ASSISTANT:   ASSISTANTS: bethune   ANESTHESIA:   general  EBL:  Total I/O In: 1000 [I.V.:1000] Out: 150 [Urine:150]  BLOOD ADMINISTERED:none  DRAINS: (1) Hemovact drain(s) in the l knee with  Suction Open   LOCAL MEDICATIONS USED:  MARCAINE     SPECIMEN:  No Specimen  DISPOSITION OF SPECIMEN:  N/A  COUNTS:  YES  TOURNIQUET:   Total Tourniquet Time Documented: Thigh (Left) - 74 minutes Total: Thigh (Left) - 74 minutes   DICTATION: .Other Dictation: Dictation Number [id number]  PLAN OF CARE: Admit to inpatient   PATIENT DISPOSITION:  PACU - hemodynamically stable.   Delay start of Pharmacological VTE agent (>24hrs) due to surgical blood loss or risk of bleeding: no

## 2012-07-08 NOTE — H&P (Signed)
TOTAL KNEE ADMISSION H&P  Patient is being admitted for left total knee arthroplasty.  Subjective:  Chief Complaint:left knee pain.  HPI: Lauren Huffman, 57 y.o. female, has a history of pain and functional disability in the left knee due to arthritis and has failed non-surgical conservative treatments for greater than 12 weeks to includeNSAID's and/or analgesics, corticosteriod injections, viscosupplementation injections, use of assistive devices, weight reduction as appropriate and activity modification.  Onset of symptoms was gradual, starting 7 years ago with gradually worsening course since that time. The patient noted no past surgery on the left knee(s).  Patient currently rates pain in the left knee(s) at 7 out of 10 with activity. Patient has night pain, worsening of pain with activity and weight bearing, pain that interferes with activities of daily living, pain with passive range of motion and joint swelling.  Patient has evidence of subchondral sclerosis and joint subluxation by imaging studies. This patient has had failure of conservative care. There is no active infection.  Patient Active Problem List   Diagnosis Date Noted  . Obesity 12/28/2011  . Osteoarthritis of right knee 12/25/2011   Past Medical History  Diagnosis Date  . Sleep apnea     DOES NOT WEAR CPAP    . Mental disorder   . Depression   . Headache   . Arthritis     Past Surgical History  Procedure Laterality Date  . No past surgeries    . Knee arthroplasty  12/25/2011    Procedure: COMPUTER ASSISTED TOTAL KNEE ARTHROPLASTY;  Surgeon: [name], MD;  Location: [facility] OR;  Service: Orthopedics;  Laterality: Right;  Right total knee replacement, general anesthesia, femoral nerve block  . Joint replacement Right     knee  . Cyst removal hand Left     Prescriptions prior to admission  Medication Sig Dispense Refill  . acetaminophen (TYLENOL) 500 MG tablet Take 1,000 mg by mouth every 6 (six) hours as needed for  pain.      Marland Kitchen buPROPion (WELLBUTRIN XL) 150 MG 24 hr tablet Take 450 mg by mouth every morning.      . Calcium-Phosphorus-Vitamin D (CALCIUM GUMMIES PO) Take 1 tablet by mouth 2 (two) times daily.      . Cholecalciferol (VITAMIN D-3) 1000 UNITS CAPS Take 2,000 Units by mouth daily.      . citalopram (CELEXA) 40 MG tablet Take 40 mg by mouth every morning.      . Cyanocobalamin (VITAMIN B-12) 1000 MCG SUBL Place 1,000 mcg under the tongue daily.      . ferrous sulfate 325 (65 FE) MG tablet Take 325 mg by mouth daily with breakfast.      . Fexofenadine HCl (ALLEGRA PO) Take 1 tablet by mouth daily.      . fish oil-omega-3 fatty acids 1000 MG capsule Take 1 g by mouth daily.      . folic acid (FOLVITE) 1 MG tablet Take 2 mg by mouth every morning.      . Multiple Vitamins-Minerals (MULTIVITAMIN GUMMIES ADULT PO) Take 2 tablets by mouth daily.      Marland Kitchen omeprazole (PRILOSEC) 20 MG capsule Take 20 mg by mouth daily.       Allergies  Allergen Reactions  . Sulfa Antibiotics     Family History    History  Substance Use Topics  . Smoking status: Never Smoker   . Smokeless tobacco: Not on file  . Alcohol Use: No    History reviewed. No pertinent  family history.   ROS  Objective:  Physical Exam  Vital signs in last 24 hours: Temp:  [97.6 F (36.4 C)] 97.6 F (36.4 C) (04/04 0900) Pulse Rate:  [73-85] 77 (04/04 1028) Resp:  [18-20] 18 (04/04 1028) BP: (125-130)/(73-80) 125/73 mmHg (04/04 1028) SpO2:  [97 %] 97 % (04/04 1028)  Labs:   Estimated body mass index is 37.43 kg/(m^2) as calculated from the following:   Height as of 06/30/12: 5\' 9"  (1.753 m).   Weight as of 12/17/11: 115.032 kg (253 lb 9.6 oz).   Imaging Review Plain radiographs demonstrate severe degenerative joint disease of the left knee(s). The overall alignment issignificant valgus. The bone quality appears to be good for age and reported activity level.  Assessment/Plan:  End stage arthritis, left knee   The  patient history, physical examination, clinical judgment of the provider and imaging studies are consistent with end stage degenerative joint disease of the left knee(s) and total knee arthroplasty is deemed medically necessary. The treatment options including medical management, injection therapy arthroscopy and arthroplasty were discussed at length. The risks and benefits of total knee arthroplasty were presented and reviewed. The risks due to aseptic loosening, infection, stiffness, patella tracking problems, thromboembolic complications and other imponderables were discussed. The patient acknowledged the explanation, agreed to proceed with the plan and consent was signed. Patient is being admitted for inpatient treatment for surgery, pain control, PT, OT, prophylactic antibiotics, VTE prophylaxis, progressive ambulation and ADL's and discharge planning. The patient is planning to be discharged home with home health services

## 2012-07-09 LAB — BASIC METABOLIC PANEL
CO2: 24 mEq/L (ref 19–32)
Calcium: 8.5 mg/dL (ref 8.4–10.5)
GFR calc non Af Amer: >90 mL/min (ref >90)
Potassium: 4.5 mEq/L (ref 3.5–5.1)
Sodium: 136 mEq/L (ref 135–145)

## 2012-07-09 LAB — CBC
Hemoglobin: 10.4 g/dL — ABNORMAL LOW (ref 12.0–15.0)
MCH: 28.3 pg (ref 26.0–34.0)
MCHC: 33.2 g/dL (ref 30.0–36.0)
Platelets: 249 10*3/uL (ref 150–400)
RBC: 3.67 MIL/uL — ABNORMAL LOW (ref 3.87–5.11)

## 2012-07-09 NOTE — Op Note (Signed)
NAMEALEXEA, Lauren Huffman NO.:  [id number]  MEDICAL RECORD NO.:  [id number]  LOCATION:  5N29C                        FACILITY:  [facility]  PHYSICIAN:  [name], M.D.   DATE OF BIRTH:  April 06, 1956  DATE OF PROCEDURE:  07/08/2012 DATE OF DISCHARGE:                              OPERATIVE REPORT   PREOPERATIVE DIAGNOSIS:  End-stage degenerative joint disease, left knee.  POSTOPERATIVE DIAGNOSIS:  End-stage degenerative joint disease, left knee.  PROCEDURE: 1. Left total knee replacement with a Sigma system, size 4 femur, size     4 tibia, 12.5 mm bridging bearing, and a 35 mm, all polyethylene     patella. 2. Computer-assisted left total knee replacement. 3. Lateral retinacular release, left knee.  SURGEON:  [name], M.D.  ASSISTANT:  Marshia Ly, P.A.  ANESTHESIA:  General.  BRIEF HISTORY:  Lauren Huffman is a 57 year old female with a history of having severe bilateral degenerative joint disease of her knees.  She had undergone right total knee replacement about 9 months ago and I agree with that.  She had persistent night pain, light activity pain. She had a dramatic valgus malalignment.  Because of failure of all conservative care, she was brought to the operating room for left total knee replacement.  Because of her young age and valgus malalignment, we felt the computer assistance was appropriate, and this was chosen to using preoperatively.  DESCRIPTION OF PROCEDURE:  The patient was taken to the operating room. After adequate anesthesia was obtained with general anesthetic, the patient was placed supine on the operating table.  Left leg prepped and draped in usual sterile fashion.  Following this, the leg was exsanguinated.  Blood pressure was inflated to 350 mmHg.  Following this, a midline incision was made following the valgus alignment of her leg.  Subcutaneous tissue down the level of the extensor mechanism and a medial parapatellar  arthrotomy was undertaken.  Once this was done, mediolateral meniscus, retropatellar fat pad, synovium in the anterior aspect of femur and anterior posterior cruciates were removed, and then at this point, the computer modules were placed, 2 pins in the tibia, 2 pins in the femur, and the registration process was undertaken.  This adds 30 minutes of surgical procedure.  Once this was completed and the registration process had been completed, the tibia was then cut perpendicular to its long axis under computer assistance.  The femurs was then cut perpendicular to the anatomic axis under computer assistance.  Once this was done, the spacer blocks put in place, and 10 easily fits.  Attention turned to the femur, sized to about a 4.5.  We used a size 4 block and posteriolyzed it 2.5 mm, made anterior and posterior cuts, chamfers and box.  Attention was then turned to the tibia, sized to a 4, which was drilled and keeled.  The trial components were then put in place, turned to the patella.  We cut it down to a level of 14 mm.  The 38 was too big, so went to a 35 paddle patella and the lugs were drilled here.  The trial components were put in place, put  through a range of motion.  During the case, multiple cartilaginous loose bodies were coming out of the back of the knee, and we did excise some posterior osteophytes after placement of the femoral component.  At this point, attention was turned back to the knee where all the trial components were removed.  The knee was copiously and thoroughly irrigated with pulsatile lavage irrigation and suctioned dry, and then dried thoroughly.  The final components were then cemented in place, size 4 tibia, size 4 femur, a 12.5 mm bridging bearing trial was placed, and a 35-mm all poly patella and this was held with a clamp.  All cement was allowed to harden.  Once all the excess bone cement was removed, the tourniquet was let down.  All bleeding was  controlled with electrocautery.  The 12.5 trial was removed.  A 12.5 final poly was placed.  The wound was irrigated, suctioned dry one final time.  The medium Hemovac drain was placed.  Upon trialing the alignment with the trial components, the patella would not track in the groove at all, it was being pulled dramatically lateral.  At this point, we did a lateral retinacular release, and this did give much more midline patellar tracking.  At this point, after we irrigated and dried the knee, the medial parapatellar arthrotomy was closed with 1 Vicryl running, the skin with 0 and 2-0 Vicryl and 3-0 Monocryl subcuticular.  Benzoin and Steri-Strips were applied.  Sterile compressive dressing was applied.  The patient was taken to the recovery room, and she was noted to be in satisfactory condition.  Estimated blood loss for the procedure was minimal.     [name], M.D.     Ranae Plumber  D:  07/08/2012  T:  07/09/2012  Job:  [id number]

## 2012-07-09 NOTE — Progress Notes (Signed)
Subjective: POD1 R TKA Tol PO, doing well, very aware of need to avoid flexed posture   Objective: Vital signs in last 24 hours: Temp:  [97.6 F (36.4 C)-98.6 F (37 C)] 98.5 F (36.9 C) (04/05 0434) Pulse Rate:  [73-98] 76 (04/05 0434) Resp:  [9-20] 16 (04/05 0434) BP: (112-134)/(62-90) 112/62 mmHg (04/05 0434) SpO2:  [93 %-100 %] 97 % (04/05 0434)  Intake/Output from previous day: 04/04 0701 - 04/05 0700 In: 2000 [I.V.:2000] Out: 3125 [Urine:2650; Drains:425; Blood:50] Intake/Output this shift:     Recent Labs  07/09/12 0406  HGB 10.4*    Recent Labs  07/09/12 0406  WBC 12.6*  RBC 3.67*  HCT 31.3*  PLT 249    Recent Labs  07/09/12 0406  NA 136  K 4.5  CL 102  CO2 24  BUN 15  CREATININE 0.61  GLUCOSE 154*  CALCIUM 8.5   No results found for this basename: LABPT, INR,  in the last 72 hours  Neurovascular intact Compartment soft dressing intact  Assessment/Plan: HV d/c'd. Continue routine postop protocol, plan to d/c to [facility] on Monday   Ione Sandusky A. 07/09/2012, 8:16 AM

## 2012-07-09 NOTE — Evaluation (Signed)
Physical Therapy Evaluation Patient Details Name: Lauren Huffman MRN: [id number] DOB: 10-14-1955 Today's Date: 07/09/2012 Time: 0930-1000 PT Time Calculation (min): 30 min  PT Assessment / Plan / Recommendation Clinical Impression  Pt is a 57 y/o female s/p L TKA.  Pt lives with her spouse but is home alone most of the day.  Pt planning to d/c to short term SNF for rehab.   Acute PT to follow pt to progress mobility     PT Assessment  Patient needs continued PT services    Follow Up Recommendations  SNF;Supervision/Assistance - 24 hour    Does the patient have the potential to tolerate intense rehabilitation      Barriers to Discharge Decreased caregiver support Caregiver only available in evenings.     Equipment Recommendations  None recommended by PT    Recommendations for Other Services     Frequency 7X/week    Precautions / Restrictions Precautions Precautions: None Required Braces or Orthoses: Knee Immobilizer - Left Restrictions Weight Bearing Restrictions: Yes   Pertinent Vitals/Pain 8/10 pain in Knee. RN notified.        Mobility  Bed Mobility Bed Mobility: Supine to Sit;Sitting - Scoot to Edge of Bed Supine to Sit: 4: Min guard Sitting - Scoot to [place] of Bed: 4: Min guard Details for Bed Mobility Assistance: Vcs for technique.  Pt able to manage LLE independently.  Transfers Transfers: Sit to Stand;Stand to Sit Sit to Stand: 4: Min guard Stand to Sit: 4: Min guard Details for Transfer Assistance: Vcs for hand placement.  Ambulation/Gait Ambulation/Gait Assistance: 4: Min guard Ambulation Distance (Feet): 25 Feet Assistive device: Rolling walker Ambulation/Gait Assistance Details: Vcs for sequencing Gait Pattern: Step-to pattern Stairs: No    Exercises Total Joint Exercises Ankle Circles/Pumps: 10 reps;Both Quad Sets: 10 reps;Left   PT Diagnosis: Difficulty walking;Acute pain;Generalized weakness  PT Problem List: Decreased strength;Decreased range of  motion;Decreased activity tolerance;Decreased mobility;Decreased knowledge of use of DME;Decreased knowledge of precautions;Pain PT Treatment Interventions: Gait training;DME instruction;Functional mobility training;Therapeutic activities;Therapeutic exercise;Patient/family education   PT Goals Acute Rehab PT Goals PT Goal Formulation: With patient Time For Goal Achievement: 07/16/12 Pt will go Supine/Side to Sit: with modified independence PT Goal: Supine/Side to Sit - Progress: Goal set today Pt will go Sit to Supine/Side: with modified independence PT Goal: Sit to Supine/Side - Progress: Goal set today Pt will Transfer Bed to Chair/Chair to Bed: with modified independence PT Transfer Goal: Bed to Chair/Chair to Bed - Progress: Goal set today Pt will Ambulate: >150 feet;with modified independence;with least restrictive assistive device PT Goal: Ambulate - Progress: Goal set today  Visit Information  Last PT Received On: 07/09/12 Assistance Needed: +1    Subjective Data  Subjective: agree to PT eval.  Patient Stated Goal: return to home.    Prior Functioning  Home Living Lives With: Spouse;Daughter Available Help at Discharge: Available PRN/intermittently Type of Home: House Home Access: Stairs to enter Secretary/administrator of Steps: 2 Home Layout: One level Bathroom Shower/Tub: Engineer, manufacturing systems: Standard Home Adaptive Equipment: Bedside commode/3-in-1;Walker - rolling Prior Function Level of Independence: Independent Able to Take Stairs?: Yes Driving: Yes Vocation: Full time employment Communication Communication: No difficulties    Cognition  Cognition Overall Cognitive Status: Appears within functional limits for tasks assessed/performed Arousal/Alertness: Awake/alert Orientation Level: Appears intact for tasks assessed;Oriented X4 / Intact Behavior During Session: [employer] for tasks performed    Extremity/Trunk Assessment Right Lower Extremity  Assessment RLE ROM/Strength/Tone: [facility] for tasks assessed  Left Lower Extremity Assessment LLE ROM/Strength/Tone: Unable to fully assess   Balance Balance Balance Assessed: No  End of Session PT - End of Session Equipment Utilized During Treatment: Gait belt;Left knee immobilizer Activity Tolerance: Patient tolerated treatment well CPM Left Knee CPM Left Knee: Off  GP     Jordin Dambrosio 07/09/2012, 11:55 AM  Theron Arista L. Juwon Scripter DPT [phone]

## 2012-07-09 NOTE — Progress Notes (Signed)
Pt was in bed when I arrived. 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 She said she felt good w/some expected discomfort. Pt was about to nap, so Chaplain visit was short. Pt appreciated prayer and visit. Marjory Lies Chaplain

## 2012-07-10 LAB — CBC
HCT: 30.1 % — ABNORMAL LOW (ref 36.0–46.0)
MCH: 28.4 pg (ref 26.0–34.0)
MCHC: 33.2 g/dL (ref 30.0–36.0)
MCV: 85.5 fL (ref 78.0–100.0)
RDW: 15.2 % (ref 11.5–15.5)
WBC: 10.3 10*3/uL (ref 4.0–10.5)

## 2012-07-10 NOTE — Clinical Social Work Psychosocial (Signed)
Clinical Social Work Department BRIEF PSYCHOSOCIAL ASSESSMENT 07/10/2012  Patient:  Lauren Huffman, Lauren Huffman     Account Number:  [id number]     Admit date:  07/08/2012  Clinical Social Worker:  Skip Mayer  Date/Time:  07/10/2012 10:00 AM  Referred by:  Physician  Date Referred:  07/10/2012 Referred for  SNF Placement   Other Referral:   Interview type:  Patient Other interview type:    PSYCHOSOCIAL DATA Living Status:  FAMILY Admitted from facility:   Level of care:   Primary support name:  [name] Primary support relationship to patient:  SPOUSE Degree of support available:   Adequate, per pt    CURRENT CONCERNS Current Concerns  Post-Acute Placement   Other Concerns:    SOCIAL WORK ASSESSMENT / PLAN CSW consulted for SNF placement.  Pt reports pre-reg with [facility] and understands her insurance will determine approval of SNF based on PT/OT recommendations.  Pt reports she is considering going home as she is progressing well with therapy, but would like to discuss with her family.  Confirmed with Jasmine December at [facility] that pt has a bed pending insurance auth.  CSW initiated insurance auth.  Weekday CSW to f/u with pt re: SNF vs. Home.   Assessment/plan status:  Information/Referral to [facility] Other assessment/ plan:   Information/referral to community resources:   SNF  PTAR    PATIENT'S/FAMILY'S RESPONSE TO PLAN OF CARE: Pt reports unsure if she will d/c to SNF or home.  Pt will discuss with her family and notify weekday CSW of choice.  Pt verbalized appreciation for CSW assist.        Dellie Burns, MSW, LCSWA [phone] (Weekends 8:00am-4:30pm)

## 2012-07-10 NOTE — Progress Notes (Signed)
Physical Therapy Treatment Patient Details Name: Lauren Huffman MRN: [id number] DOB: 1955/08/28 Today's Date: 07/10/2012 Time: 0100-0124 PT Time Calculation (min): 24 min  PT Assessment / Plan / Recommendation Comments on Treatment Session  Pt making great progress with mobility.  Pt states she is strongly considering d/cing directly home rather than to ST-SNF due to she has a friend that will be able to stay with her while her husband is at work during the day.    Integrated stair training into today's session in case pt d/c's home (stair goal added).  This clinician feels pt would be appropriate to d/c home with HHPT when MD feels medically ready for d/c.  D/c plans updated.      Follow Up Recommendations  Home health PT;Supervision for mobility/OOB;SNF     Does the patient have the potential to tolerate intense rehabilitation     Barriers to Discharge        Equipment Recommendations  None recommended by PT    Recommendations for Other Services    Frequency 7X/week   Plan Discharge plan needs to be updated    Precautions / Restrictions Precautions Precautions: None Required Braces or Orthoses: Knee Immobilizer - Left Knee Immobilizer - Left: Discontinue once straight leg raise with < 10 degree lag Restrictions Weight Bearing Restrictions: Yes LLE Weight Bearing: Weight bearing as tolerated   Pertinent Vitals/Pain 3/10 Lt knee.      Mobility  Bed Mobility Bed Mobility: Not assessed Transfers Transfers: Sit to Stand;Stand to Sit Sit to Stand: 5: Supervision;With upper extremity assist;With armrests;From chair/3-in-1 Stand to Sit: 5: Supervision;With upper extremity assist;With armrests;To chair/3-in-1 Ambulation/Gait Ambulation/Gait Assistance: 5: Supervision Ambulation Distance (Feet): 200 Feet Assistive device: Rolling walker Ambulation/Gait Assistance Details: (S) for safety.  Pt with step-through gait pattern Gait Pattern: Step-through pattern Stairs: Yes Stairs  Assistance: 4: Min assist Stairs Assistance Details (indicate cue type and reason): Cues for sequencing & technique.  (A) to stabilize/manage RW, balance, & safety  Stair Management Technique: No rails;Backwards;With walker Number of Stairs: 2 Wheelchair Mobility Wheelchair Mobility: No      PT Goals **Stair goal added at this date** Acute Rehab PT Goals Time For Goal Achievement: 07/16/12 Pt will go Supine/Side to Sit: with modified independence Pt will go Sit to Supine/Side: with modified independence Pt will Transfer Bed to Chair/Chair to Bed: with modified independence Pt will Ambulate: >150 feet;with modified independence;with least restrictive assistive device PT Goal: Ambulate - Progress: Progressing toward goal Pt will Go Up / Down Stairs: with modified independence;with rolling walker PT Goal: Up/Down Stairs - Progress: Goal set today  Visit Information  Last PT Received On: 07/10/12 Assistance Needed: +1    Subjective Data  Subjective: "Im strongly debating on going home now" Patient Stated Goal: return to home.    Cognition  Cognition Overall Cognitive Status: Appears within functional limits for tasks assessed/performed Arousal/Alertness: Awake/alert Orientation Level: Appears intact for tasks assessed;Oriented X4 / Intact Behavior During Session: [facility] for tasks performed    Balance     End of Session PT - End of Session Equipment Utilized During Treatment: Gait belt;Left knee immobilizer Activity Tolerance: Patient tolerated treatment well Patient left: in chair;with call bell/phone within reach Nurse Communication: Mobility status     Verdell Face, [place] [phone] 07/10/2012

## 2012-07-10 NOTE — Progress Notes (Signed)
Subjective: POD2 R TKA Sitting in chair, Tol PO, doing well, ambulated around unit last PM, remains aware of need to avoid flexed posture   Objective: Vital signs in last 24 hours: Temp:  [98 F (36.7 C)-98.2 F (36.8 C)] 98.1 F (36.7 C) (04/06 0620) Pulse Rate:  [69-78] 76 (04/06 0620) Resp:  [18] 18 (04/06 0620) BP: (104-131)/(60-71) 131/71 mmHg (04/06 0620) SpO2:  [96 %-98 %] 98 % (04/06 0620)  Intake/Output from previous day: 04/05 0701 - 04/06 0700 In: 300 [P.O.:300] Out: -  Intake/Output this shift:     Recent Labs  07/09/12 0406 07/10/12 0610  HGB 10.4* 10.0*    Recent Labs  07/09/12 0406 07/10/12 0610  WBC 12.6* 10.3  RBC 3.67* 3.52*  HCT 31.3* 30.1*  PLT 249 196    Recent Labs  07/09/12 0406  NA 136  K 4.5  CL 102  CO2 24  BUN 15  CREATININE 0.61  GLUCOSE 154*  CALCIUM 8.5   No results found for this basename: LABPT, INR,  in the last 72 hours  Neurovascular intact Compartment soft dressing intact  Assessment/Plan: Change dressing today. Continue routine postop protocol, plan to d/c to [facility] on Monday for subacute rehab   Rosalinda Seaman A. 07/10/2012, 9:08 AM

## 2012-07-10 NOTE — Clinical Social Work Placement (Signed)
Clinical Social Work Department CLINICAL SOCIAL WORK PLACEMENT NOTE 07/10/2012  Patient:  Lauren Huffman, Lauren Huffman  Account Number:  [id number] Admit date:  07/08/2012  Clinical Social Worker:  Skip Mayer  Date/time:  07/10/2012 10:00 AM  Clinical Social Work is seeking post-discharge placement for this patient at the following level of care:   SKILLED NURSING   (*CSW will update this form in Epic as items are completed)   07/10/2012  Patient/family provided with [facility] Department of Clinical Social Work's list of facilities offering this level of care within the geographic area requested by the patient (or if unable, by the patient's family).  07/10/2012  Patient/family informed of their freedom to choose among providers that offer the needed level of care, that participate in Medicare, Medicaid or managed care program needed by the patient, have an available bed and are willing to accept the patient.  07/10/2012  Patient/family informed of [facility]' ownership interest in [facility], as well as of the fact that they are under no obligation to receive care at this facility.  PASARR submitted to EDS on 07/10/2012 PASARR number received from EDS on 07/10/2012  FL2 transmitted to all facilities in geographic area requested by pt/family on  07/10/2012 FL2 transmitted to all facilities within larger geographic area on   Patient informed that his/her managed care company has contracts with or will negotiate with  certain facilities, including the following:     Patient/family informed of bed offers received:  07/10/2012 Patient chooses bed at [facility] Physician recommends and patient chooses bed at  [facility]  Patient to be transferred to  on   Patient to be transferred to facility by   The following physician request were entered in Epic:   Additional Comments: Pre-Reg with [facility]

## 2012-07-11 ENCOUNTER — Encounter (HOSPITAL_COMMUNITY): Payer: Self-pay | Admitting: General Practice

## 2012-07-11 LAB — CBC
MCH: 28.7 pg (ref 26.0–34.0)
MCHC: 33.3 g/dL (ref 30.0–36.0)
Platelets: 247 10*3/uL (ref 150–400)
RBC: 3.55 MIL/uL — ABNORMAL LOW (ref 3.87–5.11)
RDW: 15.2 % (ref 11.5–15.5)

## 2012-07-11 MED ORDER — ACETAMINOPHEN 325 MG PO TABS
650.0000 mg | ORAL_TABLET | Freq: Four times a day (QID) | ORAL | Status: DC | PRN
  Filled 2012-07-11: qty 2

## 2012-07-11 MED ORDER — METHOCARBAMOL 500 MG PO TABS: 500.0000 mg | ORAL_TABLET | Freq: Four times a day (QID) | ORAL | Status: DC

## 2012-07-11 MED ORDER — ASPIRIN 325 MG PO TBEC: 325.0000 mg | DELAYED_RELEASE_TABLET | Freq: Two times a day (BID) | ORAL | Status: DC

## 2012-07-11 NOTE — Progress Notes (Signed)
Physical Therapy Treatment Patient Details Name: MARELI ANTUNES MRN: [id number] DOB: 08-25-1955 Today's Date: 07/11/2012 Time: 1478-2956 PT Time Calculation (min): 26 min  PT Assessment / Plan / Recommendation Comments on Treatment Session  Pt making good progress towards goals.Pt hopeful to D/C home with family today. Pt reports she will have (A) from friends when her husband is at work during the day. Pt cleared from PT standpoint to D/C home with HHPT and 24/7 care. No DME needs at this time.     Follow Up Recommendations  Home health PT;Supervision/Assistance - 24 hour     Does the patient have the potential to tolerate intense rehabilitation     Barriers to Discharge        Equipment Recommendations  None recommended by PT    Recommendations for Other Services    Frequency 7X/week   Plan Discharge plan remains appropriate;Frequency remains appropriate    Precautions / Restrictions Precautions Precautions: None Required Braces or Orthoses: Knee Immobilizer - Left Knee Immobilizer - Left: Discontinue once straight leg raise with < 10 degree lag Restrictions Weight Bearing Restrictions: Yes LLE Weight Bearing: Weight bearing as tolerated   Pertinent Vitals/Pain 3/10 given ice pack at end of treatment session.     Mobility  Bed Mobility Bed Mobility: Supine to Sit;Sitting - Scoot to Edge of Bed;Sit to Supine Supine to Sit: 5: Supervision Sitting - Scoot to Edge of Bed: 5: Supervision Sit to Supine: 5: Supervision Details for Bed Mobility Assistance: able to independently advance L LE to and off EOB. demo good technique. Transfers Transfers: Sit to Stand;Stand to Sit Sit to Stand: 5: Supervision;From bed;With upper extremity assist Stand to Sit: 5: Supervision;To bed;With upper extremity assist Details for Transfer Assistance: min vc's for hand placement and safety Ambulation/Gait Ambulation/Gait Assistance: 5: Supervision Ambulation Distance (Feet): 200 Feet Assistive  device: Rolling walker Ambulation/Gait Assistance Details: Pt demo increased ability to amb step to gt pattern; cues for RW safety < 10% of time; pt very anxious and nervous with amb; requires max encouragement  Gait Pattern: Step-through pattern Gait velocity: decreased Stairs: Yes Stairs Assistance: 4: Min guard Stairs Assistance Details (indicate cue type and reason): given vc's and handout for gt sequencing and technique. pt very anxious and nervous but able to recall instructions preciously learned on proper sequencing. Required min Guard to stabilize RW. Stair Management Technique: No rails;Backwards;With walker Number of Stairs: 2 Wheelchair Mobility Wheelchair Mobility: No    Exercises Total Joint Exercises Short Arc Quad: AAROM;Strengthening;Left;10 reps;Supine Heel Slides: AROM;Left;10 reps;Seated Hip ABduction/ADduction: AAROM;Left;10 reps;Supine Straight Leg Raises: AAROM;Left;10 reps;Supine   PT Diagnosis:    PT Problem List:   PT Treatment Interventions:     PT Goals Acute Rehab PT Goals PT Goal Formulation: With patient Time For Goal Achievement: 07/16/12 Potential to Achieve Goals: Good PT Goal: Supine/Side to Sit - Progress: Progressing toward goal PT Goal: Sit to Supine/Side - Progress: Progressing toward goal PT Transfer Goal: Bed to Chair/Chair to Bed - Progress: Progressing toward goal PT Goal: Ambulate - Progress: Progressing toward goal PT Goal: Up/Down Stairs - Progress: Progressing toward goal  Visit Information  Last PT Received On: 07/11/12 Assistance Needed: +1    Subjective Data  Subjective: "I am going home today. The doctor told me I dont have to wear the knee immobilizer because it has been bruising me, as long as i dont feel like its going to buckle". Patient Stated Goal: return to home.    Cognition  Cognition Overall  Cognitive Status: Appears within functional limits for tasks assessed/performed Arousal/Alertness: Awake/alert Orientation  Level: Appears intact for tasks assessed;Oriented X4 / Intact Behavior During Session: [facility] for tasks performed    Balance  Balance Balance Assessed: No  End of Session PT - End of Session Equipment Utilized During Treatment: Gait belt Activity Tolerance: Patient tolerated treatment well Patient left: in bed;with call bell/phone within reach Nurse Communication: Mobility status   GP     Donell Sievert, [place] [phone] 07/11/2012, 9:28 AM

## 2012-07-11 NOTE — Progress Notes (Signed)
Agree with PTA on recommendations.  [place], [place], Andruw Batt[place] [phone]

## 2012-07-11 NOTE — Progress Notes (Signed)
Pt discharged to home accompanied by husband. Discharge instructions and rx given and explained and pt stated understanding. Pts IV was removed prior to discharge. Pt left unit in a stable condition via wheelchair.

## 2012-07-11 NOTE — Progress Notes (Signed)
Subjective: 3 Days Post-Op Procedure(s) (LRB): COMPUTER ASSISTED TOTAL KNEE ARTHROPLASTY (Left) Patient reports pain as mild.    Objective: Vital signs in last 24 hours: Temp:  [97.5 F (36.4 C)-98.4 F (36.9 C)] 98.4 F (36.9 C) (04/07 0533) Pulse Rate:  [79-97] 97 (04/07 0533) Resp:  [10-53] 16 (04/07 0533) BP: (105-136)/(61-79) 136/79 mmHg (04/07 0533) SpO2:  [96 %-97 %] 96 % (04/07 0533)  Intake/Output from previous day:   Intake/Output this shift:     Recent Labs  07/09/12 0406 07/10/12 0610 07/11/12 0552  HGB 10.4* 10.0* 10.2*    Recent Labs  07/10/12 0610 07/11/12 0552  WBC 10.3 10.7*  RBC 3.52* 3.55*  HCT 30.1* 30.6*  PLT 196 247    Recent Labs  07/09/12 0406  NA 136  K 4.5  CL 102  CO2 24  BUN 15  CREATININE 0.61  GLUCOSE 154*  CALCIUM 8.5   No results found for this basename: LABPT, INR,  in the last 72 hours  Neurologically intact ABD soft Neurovascular intact Sensation intact distally Intact pulses distally Dorsiflexion/Plantar flexion intact No cellulitis present Compartment soft  Assessment/Plan: 3 Days Post-Op Procedure(s) (LRB): COMPUTER ASSISTED TOTAL KNEE ARTHROPLASTY (Left) Advance diet Up with therapy Discharge home with home health  Lauren Huffman L 07/11/2012, 8:14 AM

## 2012-07-11 NOTE — Progress Notes (Signed)
Per discussion with Vance Peper- RNCM- patient has elected to return home with Home Health rather than seek SNF placement.  CSW notified Jasmine December at [facility] as patient had pre-registered with facility.  Patient d/c'd prior to CSW being able to see her. CSW singing off.

## 2012-07-11 NOTE — Discharge Summary (Signed)
Patient ID: Lauren Huffman MRN: [id number] DOB/AGE: August 03, 1955 57 y.o.  Admit date: 07/08/2012 Discharge date: 07/11/2012  Admission Diagnoses:  Principal Problem:   Osteoarthritis of left knee   Discharge Diagnoses:  Same  Past Medical History  Diagnosis Date  . Sleep apnea     DOES NOT WEAR CPAP    . Mental disorder   . Depression   . Headache   . Arthritis     Surgeries: Procedure(s):Left COMPUTER ASSISTED TOTAL KNEE ARTHROPLASTY on 07/08/2012    Discharged Condition: Improved  Hospital Course: Lauren Huffman is an 57 y.o. female who was admitted 07/08/2012 for operative treatment ofOsteoarthritis of left knee. Patient has severe unremitting pain that affects sleep, daily activities, and work/hobbies. After pre-op clearance the patient was taken to the operating room on 07/08/2012 and underwent  Procedure(s):Left  COMPUTER ASSISTED TOTAL KNEE ARTHROPLASTY.    Patient was given perioperative antibiotics: Anti-infectives   Start     Dose/Rate Route Frequency Ordered Stop   07/08/12 1800  ceFAZolin (ANCEF) IVPB 2 g/50 mL premix     2 g 100 mL/hr over 30 Minutes Intravenous Every 6 hours 07/08/12 1641 07/09/12 0420   07/08/12 1125  cefUROXime (ZINACEF) injection  Status:  Discontinued       As needed 07/08/12 1126 07/08/12 1336   07/08/12 0600  ceFAZolin (ANCEF) IVPB 2 g/50 mL premix     2 g 100 mL/hr over 30 Minutes Intravenous On call to O.R. 07/07/12 1446 07/08/12 1105       Patient was given sequential compression devices, early ambulation, and chemoprophylaxis to prevent DVT.  Patient benefited maximally from hospital stay and there were no complications.    Recent vital signs: Patient Vitals for the past 24 hrs:  BP Temp Temp src Pulse Resp SpO2  07/11/12 0533 136/79 mmHg 98.4 F (36.9 C) Oral 97 16 96 %  07/10/12 2210 121/63 mmHg 98.4 F (36.9 C) Oral 87 15 96 %  07/10/12 1500 105/61 mmHg 97.9 F (36.6 C) Oral 80 10 97 %  07/10/12 1435 - 97.5 F (36.4 C) - 79 53 96 %      Recent laboratory studies:  Recent Labs  07/09/12 0406 07/10/12 0610 07/11/12 0552  WBC 12.6* 10.3 10.7*  HGB 10.4* 10.0* 10.2*  HCT 31.3* 30.1* 30.6*  PLT 249 196 247  NA 136  --   --   K 4.5  --   --   CL 102  --   --   CO2 24  --   --   BUN 15  --   --   CREATININE 0.61  --   --   GLUCOSE 154*  --   --   CALCIUM 8.5  --   --      Discharge Medications:     Medication List    TAKE these medications       acetaminophen 500 MG tablet  Commonly known as:  TYLENOL  Take 1,000 mg by mouth every 6 (six) hours as needed for pain.     ALLEGRA PO  Take 1 tablet by mouth daily.     aspirin 325 MG EC tablet  Take 1 tablet (325 mg total) by mouth 2 (two) times daily after a meal.     buPROPion 150 MG 24 hr tablet  Commonly known as:  WELLBUTRIN XL  Take 450 mg by mouth every morning.     CALCIUM GUMMIES PO  Take 1 tablet by mouth 2 (two)  times daily.     citalopram 40 MG tablet  Commonly known as:  CELEXA  Take 40 mg by mouth every morning.     ferrous sulfate 325 (65 FE) MG tablet  Take 325 mg by mouth daily with breakfast.     fish oil-omega-3 fatty acids 1000 MG capsule  Take 1 g by mouth daily.     folic acid 1 MG tablet  Commonly known as:  FOLVITE  Take 2 mg by mouth every morning.     methocarbamol 500 MG tablet  Commonly known as:  ROBAXIN  Take 1 tablet (500 mg total) by mouth 4 (four) times daily.     MULTIVITAMIN GUMMIES ADULT PO  Take 2 tablets by mouth daily.     omeprazole 20 MG capsule  Commonly known as:  PRILOSEC  Take 20 mg by mouth daily.     oxyCODONE-acetaminophen 5-325 MG per tablet  Commonly known as:  PERCOCET/ROXICET  Take 1-2 tablets by mouth every 6 (six) hours as needed for pain.     Vitamin B-12 1000 MCG Subl  Place 1,000 mcg under the tongue daily.     Vitamin D-3 1000 UNITS Caps  Take 2,000 Units by mouth daily.        Diagnostic Studies: No results found.  Disposition: 03-Skilled Nursing Facility       Discharge Orders   Future Orders Complete By Expires     CPM  As directed     Comments:      Continuous passive motion machine (CPM):      Use the CPM from 0 degrees to 60 degrees for 6-8 hours per day.      You may increase by 10 degrees per day.  You may break it up into 2 or 3 sessions per day.      Use CPM for 1-2 weeks or until you are told to stop.    Call MD / Call 911  As directed     Comments:      If you experience chest pain or shortness of breath, CALL 911 and be transported to the hospital emergency room.  If you develope a fever above 101 F, pus (white drainage) or increased drainage or redness at the wound, or calf pain, call your surgeon's office.    Constipation Prevention  As directed     Comments:      Drink plenty of fluids.  Prune juice may be helpful.  You may use a stool softener, such as Colace (over the counter) 100 mg twice a day.  Use MiraLax (over the counter) for constipation as needed.    Consult to care management  As directed     Comments:      HHPT, Home CPM, For Discharge home today.    Diet general  As directed     Do not put a pillow under the knee. Place it under the heel.  As directed     Increase activity slowly as tolerated  As directed     Weight bearing as tolerated  As directed        Follow-up Information   Follow up with GRAVES,JOHN L, MD. Schedule an appointment as soon as possible for a visit in 2 weeks.   Contact information:   [address] [place] [address] [phone]        Signed: Matthew Folks 07/11/2012, 9:14 AM

## 2012-07-11 NOTE — Care Management Note (Signed)
CARE MANAGEMENT NOTE 07/11/2012  Patient:  Lauren Huffman, Lauren Huffman   Account Number:  [id number]  Date Initiated:  07/11/2012  Documentation initiated by:  Vance Peper  Subjective/Objective Assessment:   57 yr old female s/p left total knee arthroplasty.     Action/Plan:   CM spoke with patient concerning home health and DME needs at discharge. patient states she will go home rather than SNF as planned. Choice offered. She has rolling walker and 3in1. CPM ordered. Patient has family support at Discharge.   Anticipated DC Date:  07/11/2012   Anticipated DC Plan:  HOME W HOME HEALTH SERVICES      DC Planning Services  CM consult      [facility] Choice  HOME HEALTH   Choice offered to / List presented to:  C-1 Patient   DME arranged  CPM      DME agency  [facility]     HH arranged  HH-2 PT      [facility] agency  [facility]   Status of service:  Completed, signed off Medicare Important Message given?   (If response is "NO", the following Medicare IM given date fields will be blank) Date Medicare IM given:   Date Additional Medicare IM given:    Discharge Disposition:  HOME W HOME HEALTH SERVICES  Per UR Regulation:    If discussed at Long Length of Stay Meetings, dates discussed:    Comments:  07/11/12 10:30 AM Vance Peper, RN BSN Case Manager [phone] CM faxed orders to [facility] @ [phone]. Start of Care will be 07/13/12.

## 2012-07-14 NOTE — Anesthesia Postprocedure Evaluation (Signed)
Anesthesia Post Note  Patient: Lauren Huffman  Procedure(s) Performed: Procedure(s) (LRB): COMPUTER ASSISTED TOTAL KNEE ARTHROPLASTY (Left)  Anesthesia type: general  Patient location: PACU  Post pain: Pain level controlled  Post assessment: Patient's Cardiovascular Status Stable  Last Vitals:  Filed Vitals:   07/11/12 0533  BP: 136/79  Pulse: 97  Temp: 36.9 C  Resp: 16    Post vital signs: Reviewed and stable  Level of consciousness: sedated  Complications: No apparent anesthesia complications

## 2012-07-21 ENCOUNTER — Ambulatory Visit (HOSPITAL_COMMUNITY)
Admission: RE | Admit: 2012-07-21 | Discharge: 2012-07-21 | Disposition: A | Discharge status: Home / Self Care | Payer: No Typology Code available for payment source | Source: Ambulatory Visit | Attending: Orthopedic Surgery | Admitting: Orthopedic Surgery

## 2012-07-21 ENCOUNTER — Other Ambulatory Visit (HOSPITAL_COMMUNITY): Payer: Self-pay | Admitting: Orthopedic Surgery

## 2012-07-21 DIAGNOSIS — M25562 Pain in left knee: Secondary | ICD-10-CM

## 2012-07-21 DIAGNOSIS — Z96659 Presence of unspecified artificial knee joint: Secondary | ICD-10-CM | POA: Insufficient documentation

## 2012-07-21 DIAGNOSIS — M7989 Other specified soft tissue disorders: Secondary | ICD-10-CM

## 2012-07-21 DIAGNOSIS — M79609 Pain in unspecified limb: Secondary | ICD-10-CM | POA: Insufficient documentation

## 2012-07-21 DIAGNOSIS — E669 Obesity, unspecified: Secondary | ICD-10-CM

## 2012-07-21 NOTE — Progress Notes (Signed)
VASCULAR LAB PRELIMINARY  PRELIMINARY  PRELIMINARY  PRELIMINARY  Left lower extremity venous duplex completed.    Preliminary report:  Left:  No evidence of DVT, superficial thrombosis, or Baker's cyst.  Lauren Huffman, RVS 07/21/2012, 5:47 PM

## 2014-03-27 ENCOUNTER — Other Ambulatory Visit (HOSPITAL_COMMUNITY): Payer: Self-pay | Admitting: Family Medicine

## 2014-03-27 DIAGNOSIS — Z1231 Encounter for screening mammogram for malignant neoplasm of breast: Secondary | ICD-10-CM

## 2014-04-03 ENCOUNTER — Ambulatory Visit (HOSPITAL_COMMUNITY): Payer: No Typology Code available for payment source

## 2014-04-04 ENCOUNTER — Ambulatory Visit (HOSPITAL_COMMUNITY)
Admission: RE | Admit: 2014-04-04 | Discharge: 2014-04-04 | Disposition: A | Discharge status: Home / Self Care | Payer: Managed Care, Other (non HMO) | Source: Ambulatory Visit | Attending: Family Medicine | Admitting: Family Medicine

## 2014-04-04 ENCOUNTER — Ambulatory Visit (HOSPITAL_COMMUNITY): Payer: No Typology Code available for payment source

## 2014-04-04 DIAGNOSIS — Z1231 Encounter for screening mammogram for malignant neoplasm of breast: Secondary | ICD-10-CM | POA: Insufficient documentation

## 2014-04-20 ENCOUNTER — Encounter: Payer: Self-pay | Admitting: Podiatrist

## 2014-04-20 ENCOUNTER — Ambulatory Visit (INDEPENDENT_AMBULATORY_CARE_PROVIDER_SITE_OTHER): Payer: Managed Care, Other (non HMO) | Admitting: Podiatrist

## 2014-04-20 VITALS — BP 137/89 | HR 77 | Resp 16

## 2014-04-20 DIAGNOSIS — B351 Tinea unguium: Secondary | ICD-10-CM

## 2014-04-20 DIAGNOSIS — Z79899 Other long term (current) drug therapy: Secondary | ICD-10-CM

## 2014-04-20 DIAGNOSIS — M2040 Other hammer toe(s) (acquired), unspecified foot: Secondary | ICD-10-CM

## 2014-04-20 DIAGNOSIS — M722 Plantar fascial fibromatosis: Secondary | ICD-10-CM

## 2014-04-20 DIAGNOSIS — Q6650 Congenital pes planus, unspecified foot: Secondary | ICD-10-CM

## 2014-04-20 MED ORDER — TERBINAFINE HCL 250 MG PO TABS: 250.0000 mg | ORAL_TABLET | Freq: Every day | ORAL | Status: DC

## 2014-04-20 NOTE — Progress Notes (Signed)
   Subjective:    Patient ID: Lauren Huffman, female    DOB: [birth year], 59 y.o.   MRN: [id number]  HPI Comments: "I have a couple of bad toenails"  Patient c/o thick, discolored 1st and 4th toenails left for about 6 months. She has seen dermatologist and she recommended lamisil. She does have it at the pharmacy but hasn't picked up. Her insurance required authorization first.     Review of Systems  Constitutional: Positive for fatigue.  HENT: Positive for sinus pressure.   Skin:       Change in nails  Allergic/Immunologic: Positive for environmental allergies.  All other systems reviewed and are negative.      Objective:   Physical Exam Patient is awake, alert, and oriented x 3.  In no acute distress.  Vascular status is intact with palpable pedal pulses at 2/4 DP and PT bilateral and capillary refill time within normal limits. Neurological sensation is also intact bilaterally via Semmes Weinstein monofilament at 5/5 sites. Light touch, vibratory sensation, Achilles tendon reflex is intact. Dermatological exam reveals thickening, discoloration, yellowish brownish discoloration and dystrophy on the left hallux and left fourth toenails. Otherwise skin color, turger and texture as normal. No open lesions present.  Musculature reveals pes planovalgus deformity bilateral. Contracture of digits 2 through 5 is also noted bilateral. Digital contractures however are not symptomatic. She has pain on the plantar medial aspect and plantar arch area of the right foot consistent with mild plantar fasciitis.     Assessment & Plan:  Mycotic toenails 1, 4 left; plantar fasciitis right; hammertoe deformity bilateral; pes planovalgus bilateral  Plan: A sample was taken of the left hallux nail to send for culture. I did however recommend that she go ahead and do the Lamisil prescription and this was called in for her to [facility] on [address] ([place]and she was having trouble getting it from Dr. Nicholas LoseLomax due to  the prior authorization from her insurance. Her liver function has already been tested so we will retest in 1 month. I also wrote her a prescription [facility] as she is already going there for her Achilles and peroneal tendinitis. She also has some orthotics and some new shoes however she states she is always worn orthotics and they've never really helped her plantar fasciitis. If there is no improvement with physical therapy she'll call me and we will consider injection therapy also recommended watching the hammertoes her pes planus deformity appears to have been the underlying cause for the hammertoes however at this point she states that she is in no pain therefore watch them closely.

## 2014-04-20 NOTE — Patient Instructions (Signed)
Onychomycosis  A fungal infection of the nail (tinea unguium/onychomycosis) is common. It is common as the visible part of the nail is composed of dead cells which have no blood supply to help prevent infection. It occurs because fungi are everywhere and will pick any opportunity to grow on any dead material. Because nails are very slow growing they require up to 2 years of treatment with anti-fungal medications. The entire nail back to the base is infected. This includes approximately  of the nail which you cannot see. If your caregiver has prescribed a medication by mouth, take it every day and as directed. No progress will be seen for at least 6 to 9 months. Do not be disappointed! Because fungi live on dead cells with little or no exposure to blood supply, medication delivery to the infection is slow; thus the cure is slow. It is also why you can observe no progress in the first 6 months. The nail becoming cured is the base of the nail, as it has the blood supply. Topical medication such as creams and ointments are usually not effective. Important in successful treatment of nail fungus is closely following the medication regimen that your doctor prescribes. Sometimes you and your caregiver may elect to speed up this process by surgical removal of all the nails. Even this may still require 6 to 9 months of additional oral medications. See your caregiver as directed. Remember there will be no visible improvement for at least 6 months. See your caregiver sooner if other signs of infection (redness and swelling) develop. Document Released: 03/20/2000 Document Revised: 06/15/2011 Document Reviewed: 05/29/2008 Exit[employer] Patient Information 2015 [employer]. This information is not intended to replace advice given to you by your health care provider. Make sure you discuss any questions you have with your health care provider.  

## 2015-03-05 ENCOUNTER — Ambulatory Visit: Payer: Managed Care, Other (non HMO) | Admitting: Podiatry

## 2015-03-06 ENCOUNTER — Encounter: Payer: Self-pay | Admitting: Podiatry

## 2015-03-06 ENCOUNTER — Ambulatory Visit (INDEPENDENT_AMBULATORY_CARE_PROVIDER_SITE_OTHER): Payer: Managed Care, Other (non HMO) | Admitting: Podiatry

## 2015-03-06 VITALS — BP 89/67 | HR 81 | Resp 16

## 2015-03-06 DIAGNOSIS — M2040 Other hammer toe(s) (acquired), unspecified foot: Secondary | ICD-10-CM

## 2015-03-06 DIAGNOSIS — L84 Corns and callosities: Secondary | ICD-10-CM

## 2015-03-06 DIAGNOSIS — M722 Plantar fascial fibromatosis: Secondary | ICD-10-CM

## 2015-03-06 DIAGNOSIS — B351 Tinea unguium: Secondary | ICD-10-CM | POA: Diagnosis not present

## 2015-03-06 NOTE — Progress Notes (Signed)
Subjective:     Patient ID: Lauren Huffman, female   DOB: 21-Jun-1955, 59 y.o.   MRN: [id number]  HPI patient presents with the fourth toe left have in a large keratotic lesion with lifting of the toes and plantar keratotic lesions and flatfoot deformity with occasional pain in the heels   Review of Systems     Objective:   Physical Exam Neurovascular status intact muscle strength adequate with patient and keratotic lesion fourth toe left foot that's painful dorsal moderate depression of the arch and mild discomfort in the heel region bilateral    Assessment:     Hammertoe deformity left foot along with keratotic lesion and flatfoot deformity with fasciitis    Plan:     H&P on conditions discussed and debridement lesion fourth left and padded. Discussed long-term wider-type shoe gear and supportive shoes and reappoint to recheck

## 2015-04-07 HISTORY — PX: OTHER SURGICAL HISTORY: SHX169

## 2015-06-20 ENCOUNTER — Ambulatory Visit (INDEPENDENT_AMBULATORY_CARE_PROVIDER_SITE_OTHER): Payer: BC Managed Care – PPO | Admitting: Neurology

## 2015-06-20 ENCOUNTER — Encounter: Payer: Self-pay | Admitting: Neurology

## 2015-06-20 VITALS — BP 148/95 | HR 81 | Ht 69.0 in | Wt 254.0 lb

## 2015-06-20 DIAGNOSIS — R202 Paresthesia of skin: Secondary | ICD-10-CM | POA: Diagnosis not present

## 2015-06-20 DIAGNOSIS — M25522 Pain in left elbow: Secondary | ICD-10-CM | POA: Diagnosis not present

## 2015-06-20 MED ORDER — CELECOXIB 100 MG PO CAPS: 100.0000 mg | ORAL_CAPSULE | Freq: Two times a day (BID) | ORAL | Status: DC

## 2015-06-20 MED ORDER — GABAPENTIN 300 MG PO CAPS: 300.0000 mg | ORAL_CAPSULE | Freq: Three times a day (TID) | ORAL | Status: DC

## 2015-06-20 NOTE — Progress Notes (Addendum)
PATIENT: Lauren Huffman DOB: 1956/01/29  Chief Complaint  Patient presents with  . Left arm pain    She injured her left elbow at work in 11/2013 (fell on floor) and was initially diagnosed with bursitis. She was also evaluated by an orthopaedic physician, at that time, who determined her pain was related to arthritis and her worker's compensation case was closed. She is still having pain in her left arm and tingling in left hand/fingers.  She is taking ibuprofen 600mg  three times daily for pain.  She sometimes works 3rd shift and travels out-of-town often to care for her mother, who lives in [place].     HISTORICAL  Sid FalconJan M Rochin is a 60 years old right-handed female, Seen in refer by her primary care physician Dr. Selena BattenWendy McNeil for evaluation of left arm pain  She had a past medical history of depression, right carpal tunnel syndromes, obstructive sleep apnea, but could not tolerate CPAP machine,  She suffered a fall in August 2015, fell on concrete floor landed on her left elbow, she only had mild left elbow pain initially, did go through evaluation by worker's compensation which is now closed, per patient, she was found to have significant left shoulder pathology, radiating pain from left shoulder to left lateral arm, the evaluation and treatment was focusing on her left shoulder degenerative changes, she was given corticosteroid shots to her left shoulder, which did help her symptoms temporarily, last injection was October 2016,  But per patient, her symptoms actually gradually worsened since her fall in August 2015, she continue have left shoulder pain, sensitivity from left elbow down, involving her left hand, lateral 3 fingers, she has limited range of motion of her left shoulder, had subjective weakness of her left proximal arm, she denied significant neck pain, she has mild baseline unsteady gait due to bilateral lower extremity arthritis.   She is now taking over-the-counter ibuprofen 200  mg 3 tablets 3 times a day, Voltaren gel, which provides limited help,  REVIEW OF SYSTEMS: Full 14 system review of systems performed and notable only for fatigue, snoring, aching muscles, allergy, depression, not enough sleep, snoring, shift work  ALLERGIES: Allergies  Allergen Reactions  . Sulfa Antibiotics     Family History    HOME MEDICATIONS: Current Outpatient Prescriptions  Medication Sig Dispense Refill  . buPROPion (WELLBUTRIN XL) 150 MG 24 hr tablet Take 450 mg by mouth every morning.    Marland Kitchen. CALCIUM PO Take 1,000 mg by mouth daily.    . Cholecalciferol (VITAMIN D-3) 1000 UNITS CAPS Take 5,000 Units by mouth daily.     . citalopram (CELEXA) 40 MG tablet Take 40 mg by mouth every morning.    . diclofenac sodium (VOLTAREN) 1 % GEL     . ferrous sulfate 325 (65 FE) MG tablet Take 325 mg by mouth daily with breakfast.    . folic acid (FOLVITE) 1 MG tablet Take 2 mg by mouth every morning.    Marland Kitchen. ibuprofen (ADVIL,MOTRIN) 600 MG tablet Take 600 mg by mouth 3 (three) times daily.    Marland Kitchen. LORATADINE PO Take by mouth.    . meloxicam (MOBIC) 15 MG tablet TK 1 T PO QD  2  . Multiple Vitamins-Minerals (MULTIVITAMIN GUMMIES ADULT PO) Take 2 tablets by mouth daily.    Marland Kitchen. omeprazole (PRILOSEC) 40 MG capsule TK 1 C PO BID  11  . Probiotic Product (PROBIOTIC PO) Take by mouth daily.    . Simethicone (GAS-X PO)  Take by mouth as needed.     No current facility-administered medications for this visit.    PAST MEDICAL HISTORY: Past Medical History  Diagnosis Date  . Sleep apnea     DOES NOT WEAR CPAP    . Mental disorder   . Depression   . Headache(784.0)   . Arthritis   . Left arm pain   . TMJ (dislocation of temporomandibular joint)   . Osteoarthritis   . Hiatal hernia   . Varicose veins   . Anxiety   . Carpal tunnel syndrome     Right  . Hyperlipemia     PAST SURGICAL HISTORY: Past Surgical History  Procedure Laterality Date  . Knee arthroplasty  12/25/2011    Procedure:  COMPUTER ASSISTED TOTAL KNEE ARTHROPLASTY;  Surgeon: [name], MD;  Location: [facility] OR;  Service: Orthopedics;  Laterality: Right;  Right total knee replacement, general anesthesia, femoral nerve block  . Joint replacement Right     knee  . Cyst removal hand Left   . Total knee arthroplasty Left 07/08/2012    [name]  . Knee arthroplasty Left 07/08/2012    Procedure: COMPUTER ASSISTED TOTAL KNEE ARTHROPLASTY;  Surgeon: [name], MD;  Location: [facility] OR;  Service: Orthopedics;  Laterality: Left;  PRE OP FEMORAL NERVE BLOCK    FAMILY HISTORY: Family History  Problem Relation Age of Onset  . Stroke Mother   . Seizures Mother   . Memory loss Mother   . Cancer Father     Salivary Gland   . Heart disease Father     SOCIAL HISTORY:  Social History   Social History  . Marital Status: Married    Spouse Name: N/A  . Number of Children: 2  . 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 Smoking status: Never Smoker   . Smokeless tobacco: Never Used  . Alcohol Use: No  . Drug Use: No  . Sexual Activity: Not on file   Other Topics Concern  . Not on file   Social History Narrative   Lives at home with husband and daughter.   2-4 cups caffeine daily.   Right-handed.     PHYSICAL EXAM   Filed Vitals:   06/20/15 1020  BP: 148/95  Pulse: 81  Height:  (1.753 m)  Weight: 254 lb (115.214 kg)    Not recorded      Body mass index is 37.49 kg/(m^2).  PHYSICAL EXAMNIATION:  Gen: NAD, conversant, well nourised, obese, well groomed                     Cardiovascular: Regular rate rhythm, no peripheral edema, warm, nontender. Eyes: Conjunctivae clear without exudates or hemorrhage Neck: Supple, no carotid bruise. Pulmonary: Clear to auscultation bilaterally   NEUROLOGICAL EXAM:  MENTAL STATUS: Speech:    Speech is normal; fluent and spontaneous with normal comprehension.  Cognition:     Orientation to time,  place and person     Normal recent and remote memory     Normal Attention span and concentration     Normal Language, naming, repeating,spontaneous speech     Fund of knowledge   CRANIAL NERVES: CN II: Visual fields are full to confrontation. Fundoscopic exam is normal with sharp discs and no vascular changes. Pupils are round equal and briskly reactive to light. CN III, IV, VI: extraocular movement are normal. No ptosis. CN  V: Facial sensation is intact to pinprick in all 3 divisions bilaterally. Corneal responses are intact.  CN VII: Face is symmetric with normal eye closure and smile. CN VIII: Hearing is normal to rubbing fingers CN IX, X: Palate elevates symmetrically. Phonation is normal. CN XI: Head turning and shoulder shrug are intact CN XII: Tongue is midline with normal movements and no atrophy.  MOTOR: She has limited range of motion of left shoulder, has difficulty raising left arm overhead, there was no significant weakness at bilateral upper extremity proximal distal muscles, she has left elbow Tinel signs, I was able to introduce radiating paresthesia to her left to fingers, similar to her symptoms. Indicating left ulnar nerve irritability. Bilateral lower extremity motor strength is normal,   REFLEXES: Reflexes are 2+ and symmetric at the biceps, triceps, knees, and ankles. Plantar responses are flexor.  SENSORY: Intact to light touch, pinprick, position sense, and vibration sense are intact in fingers and toes.  COORDINATION: Rapid alternating movements and fine finger movements are intact. There is no dysmetria on finger-to-nose and heel-knee-shin.    GAIT/STANCE: She tends to stand with both toes point outward, cautious, mildly unsteady   DIAGNOSTIC DATA (LABS, IMAGING, TESTING) - I reviewed patient records, labs, notes, testing and imaging myself where available.   ASSESSMENT AND PLAN  ALLEYAH TWOMBLY is a 60 y.o. female   Left elbow shoulder pain, worsened since  her fall in August 2015, radiating paresthesia to left lateral forearm, lateral 3 fingers Left ulnar neuropathy  EMG nerve conduction study  Gabapentin 300 mg 3 times a day   Celebrex 100 mg twice a day  Her left shoulder pathology likely play a major role in her complains of left upper extremity difficulty    Levert Feinstein, M.D. Ph.D.  [facility] [address] [place], [place] [address] Ph: [phone] Fax: [phone]  CC: Gweneth Dimitri, MD  I reviewed laboratory evaluation in February 2017, normal CMP, will cause level 94, creatinine 0.63,

## 2015-06-26 ENCOUNTER — Other Ambulatory Visit: Payer: Self-pay

## 2015-06-26 DIAGNOSIS — Z1231 Encounter for screening mammogram for malignant neoplasm of breast: Secondary | ICD-10-CM

## 2015-07-11 ENCOUNTER — Telehealth: Payer: Self-pay | Admitting: Neurology

## 2015-07-11 ENCOUNTER — Encounter: Payer: Self-pay | Admitting: *Deleted

## 2015-07-11 NOTE — Telephone Encounter (Signed)
Lauren JesterMichele, please call patient If she is responding well to gabapentin, we may increase to 300 mg 4 times a day or even higher dose,

## 2015-07-11 NOTE — Telephone Encounter (Signed)
Patient is calling. She is still having all over body pain and wants to know if she can increase her gabapentin (NEURONTIN) 300 MG capsule. She now takes 1 tablet 3 times a day and wants to know if she can take 1 tablet 4 times a day. Please call and discuss.

## 2015-07-11 NOTE — Telephone Encounter (Signed)
Spoke to Lauren Huffman - she will increase her gabapentin to 300mg , four times daily.  Instructed her to call back if this dose is still not helpful.  She will keep her pending appt for her NCV/EMG.

## 2015-07-15 ENCOUNTER — Telehealth: Payer: Self-pay | Admitting: Neurology

## 2015-07-15 NOTE — Telephone Encounter (Signed)
Unusual pain/ dysesthesia development - likely not GABAPENTIN related. And yes, if she took Gabapentin over 6 -8 weeks or longer she should take it down from 4 a day to 2 a day for 6 days, than d/c. Cc Dr Terrace ArabiaYan.

## 2015-07-15 NOTE — Telephone Encounter (Signed)
Pt states since starting gabapentin (NEURONTIN) 300 MG capsule she is having more and more pain from the waist down. She wants to come off and needs to know if she needs to titrate off of it. Please call and advise [phone]

## 2015-07-15 NOTE — Telephone Encounter (Signed)
Called pt back. Pt only took medication 4x/day one day. She is taking 3x/day. She will decrease to 2x/day for 6 days and d/c. She wants to know if medication is causing increased pain. Told her to call if she has further questions/concerns. She is aware Dr Terrace ArabiaYan out of office till next week. [facility]

## 2015-07-17 ENCOUNTER — Ambulatory Visit: Payer: Managed Care, Other (non HMO)

## 2015-07-20 ENCOUNTER — Other Ambulatory Visit: Payer: Self-pay | Admitting: Neurology

## 2015-07-20 ENCOUNTER — Telehealth: Payer: Self-pay | Admitting: Neurology

## 2015-07-20 DIAGNOSIS — M792 Neuralgia and neuritis, unspecified: Secondary | ICD-10-CM

## 2015-07-20 MED ORDER — TOPIRAMATE 50 MG PO TABS: 100.0000 mg | ORAL_TABLET | Freq: Two times a day (BID) | ORAL | Status: DC

## 2015-07-23 ENCOUNTER — Telehealth: Payer: Self-pay | Admitting: Neurology

## 2015-07-23 ENCOUNTER — Encounter: Payer: Self-pay | Admitting: *Deleted

## 2015-07-23 MED ORDER — TRAMADOL HCL 50 MG PO TABS: 50.0000 mg | ORAL_TABLET | Freq: Three times a day (TID) | ORAL | Status: DC | PRN

## 2015-07-23 NOTE — Telephone Encounter (Signed)
Patient is calling back and states the Rx gabapentin 300 mg capsules caused muscle cramps and body aches(pain scale of 6 or 7).  She discontinued the gabapentin per nurse but needed pain mgmt. She called the on-call doctor on Saturday who prescribed Topamax however she did not take as it was new to her and because she had lingering pain in the right leg where she has had knee replace.  She called her orthopedic doctor to check if she needs to be seen.  He suggested increasing celebrex to 400 mg per day and continuing hydrocodone if needed for break-through pain. She used hydrocodone this past weekend. If this course is approved by Dr. Terrace ArabiaYan she needs a Rx [facility] [address].  She states her NCV is scheduled in May. She will be out of town until that time. She is awaiting a call back [phone] before she leaves town today. Thanks!

## 2015-07-23 NOTE — Telephone Encounter (Signed)
Dr. Terrace ArabiaYan would like for the patient to only take Celebrex 200mg , one tablet twice daily (as prescribed).  She may also use the prescribed Tramadol as needed. Patient agreeable to this plan.

## 2015-07-23 NOTE — Telephone Encounter (Signed)
error 

## 2015-07-23 NOTE — Addendum Note (Signed)
Addended by: Levert FeinsteinYAN, Shyloh Krinke on: 07/23/2015 02:19 PM   Modules accepted: Orders

## 2015-07-23 NOTE — Telephone Encounter (Addendum)
I did not prescribe narcotic such as hydrocodone for her, it is okay for her to stop gabapentin, I will prescribe tramadol 50 mg as needed, 90 tablets each month with 3 refills.

## 2015-07-23 NOTE — Telephone Encounter (Signed)
Patient called to advise, she "had bad reaction to gabapentin (NEURONTIN) 300 MG capsule, nurse helped her with that last week, need to discuss how to replace it or what else to use, leaving town soon after we talk".

## 2015-08-07 ENCOUNTER — Encounter: Payer: BC Managed Care – PPO | Admitting: Neurology

## 2015-08-14 ENCOUNTER — Encounter: Payer: BC Managed Care – PPO | Admitting: Neurology

## 2015-08-20 ENCOUNTER — Encounter: Payer: BC Managed Care – PPO | Admitting: Neurology

## 2015-08-20 ENCOUNTER — Telehealth: Payer: Self-pay | Admitting: Neurology

## 2015-08-20 MED ORDER — TRAMADOL HCL 50 MG PO TABS: ORAL_TABLET | ORAL | Status: DC

## 2015-08-20 NOTE — Telephone Encounter (Signed)
Patient would like to know if she can take Tramadol 50mg , 1-2 tablets TID prn.  States she will only use 2 tablets at a time for her more severe pain.  Dr. Terrace ArabiaYan has provided additional medication for her and the new rx has been [facility] at [phone].  Pharmacy has been instructed to cancel previous refills on file.

## 2015-08-20 NOTE — Telephone Encounter (Signed)
Pt is requesting a question about tramadol. pleasecall

## 2015-09-16 ENCOUNTER — Encounter: Payer: BC Managed Care – PPO | Admitting: Neurology

## 2015-09-17 ENCOUNTER — Encounter (INDEPENDENT_AMBULATORY_CARE_PROVIDER_SITE_OTHER): Payer: Self-pay

## 2015-09-17 ENCOUNTER — Ambulatory Visit (INDEPENDENT_AMBULATORY_CARE_PROVIDER_SITE_OTHER): Payer: BC Managed Care – PPO | Admitting: Neurology

## 2015-09-17 DIAGNOSIS — G5602 Carpal tunnel syndrome, left upper limb: Secondary | ICD-10-CM | POA: Diagnosis not present

## 2015-09-17 DIAGNOSIS — M25522 Pain in left elbow: Secondary | ICD-10-CM | POA: Diagnosis not present

## 2015-09-17 DIAGNOSIS — R202 Paresthesia of skin: Secondary | ICD-10-CM

## 2015-09-17 DIAGNOSIS — G5600 Carpal tunnel syndrome, unspecified upper limb: Secondary | ICD-10-CM | POA: Insufficient documentation

## 2015-09-17 DIAGNOSIS — Z0289 Encounter for other administrative examinations: Secondary | ICD-10-CM

## 2015-09-17 DIAGNOSIS — G5601 Carpal tunnel syndrome, right upper limb: Secondary | ICD-10-CM

## 2015-09-17 DIAGNOSIS — G5603 Carpal tunnel syndrome, bilateral upper limbs: Secondary | ICD-10-CM

## 2015-09-18 NOTE — Procedures (Signed)
   NCS (NERVE CONDUCTION STUDY) WITH EMG (ELECTROMYOGRAPHY) REPORT   STUDY DATE: September 17 2015 PATIENT NAME: Lauren Huffman DOB: 27-Nov-1955 MRN: [id number]    TECHNOLOGIST: Gearldine ShownLorraine Jones  ELECTROMYOGRAPHER: Levert FeinsteinYan, Shawon Denzer M.D.  CLINICAL INFORMATION:  60 years old right-handed female, with multiple joints pain, worsening left shoulder pain, intermittent left lateral 3 fingers paresthesia since she fell in 2015.  FINDINGS: NERVE CONDUCTION STUDY: Bilateral arm and sensory and motor responses were normal. Bilateral median sensory response showed prolonged peak latency, left side was severe, with decreased snap amplitude; right side was moderate with preserved snap amplitude.  Right median motor responses showed moderately prolonged distal latency, with normal C map amplitude, conduction velocity.  Left median motor responses showed severely prolonged distal latency, moderately prolonged F-wave latency, with mildly decreased to C map amplitude, well-preserved conduction velocity.  NEEDLE ELECTROMYOGRAPHY: Selective needle examinations was performed at left upper extremity muscles, right abductor pollicis brevis, and left cervical paraspinal muscles.  Bilateral abductor pollicis brevis: Increased insertional activity, no spontaneous activity, enlarged complex motor unit potential, with decreased recruitment patterns.  Needle examination of left pronator teres, biceps, triceps, deltoid, first also interossei, flexor carpi ulnar was normal.  There was no spontaneous activity at left cervical paraspinal muscles, left C5, C6 and 7.   IMPRESSION:   This is an abnormal study. There is electrodiagnostic evidence of bilateral median neuropathy across the wrist, consistent with bilateral carpal tunnel syndromes, left-sided severe, right side is moderate. There is no evidence of left cervical radiculopathy.   INTERPRETING PHYSICIAN:   Levert FeinsteinYan, Ashby Moskal M.D. Ph.D. [facility] [address], [address] [place], [place] [address] [phone]

## 2015-10-10 ENCOUNTER — Other Ambulatory Visit (HOSPITAL_COMMUNITY)
Admission: RE | Admit: 2015-10-10 | Discharge: 2015-10-10 | Disposition: A | Discharge status: Home / Self Care | Payer: BC Managed Care – PPO | Source: Ambulatory Visit | Attending: Family Medicine | Admitting: Family Medicine

## 2015-10-10 ENCOUNTER — Other Ambulatory Visit: Payer: Self-pay | Admitting: Family Medicine

## 2015-10-10 DIAGNOSIS — Z124 Encounter for screening for malignant neoplasm of cervix: Secondary | ICD-10-CM | POA: Diagnosis present

## 2015-10-14 LAB — CYTOLOGY - PAP

## 2015-10-16 ENCOUNTER — Ambulatory Visit: Payer: Managed Care, Other (non HMO)

## 2016-03-13 ENCOUNTER — Other Ambulatory Visit: Payer: Self-pay | Admitting: Orthopedic Surgery

## 2016-03-20 ENCOUNTER — Encounter (HOSPITAL_COMMUNITY): Payer: Self-pay

## 2016-03-20 NOTE — Pre-Procedure Instructions (Signed)
Lauren Huffman  03/20/2016      [facility] [id number] [place] - [address] AT [facility] [address] [place] [address] Phone: [phone] Fax: [phone]  [facility] [address] - [place], [place] - [address] [place] [address] Phone: [phone] Fax: [phone]  [facility] [id number] - [address] [place] - [address] [facility] [address] [place] [address] Phone: [phone] Fax: [phone]    Your procedure is scheduled on Friday December 22.  [facility].M.  Call this number if you have problems the morning of surgery:  [phone]   Remember:  Do not eat food or drink liquids after midnight.  Take these medicines the morning of surgery with A SIP OF WATER: acetaminophen (tylenol) if needed, bupropion (wellbutrin), citalopram (celexa), loratadine (claritin), omeprazole (prilosec)  7 days prior to surgery STOP taking any diclofenac (voltaren), Aspirin, Aleve, Naproxen, Ibuprofen, Motrin, Advil, Goody's, BC's, all herbal medications, fish oil, and all vitamins    Do not wear jewelry, make-up or nail polish.  Do not wear lotions, powders, or perfumes, or deoderant.  Do not shave 48 hours prior to surgery.  Men may shave face and neck.  Do not bring valuables to the hospital.  [facility] is not responsible for any belongings or valuables.  Contacts, dentures or bridgework may not be worn into surgery.  Leave your suitcase in the car.  After surgery it may be brought to your room.  For patients admitted to the hospital, discharge time will be determined by your treatment team.  Patients discharged the day of surgery will not be allowed to drive home.   Special instructions:    [facility]- Preparing For Surgery  Before surgery, you can play an important  role. Because skin is not sterile, your skin needs to be as free of germs as possible. You can reduce the number of germs on your skin by washing with CHG (chlorahexidine gluconate) Soap before surgery.  CHG is an antiseptic cleaner which kills germs and bonds with the skin to continue killing germs even after washing.  Please do not use if you have an allergy to CHG or antibacterial soaps. If your skin becomes reddened/irritated stop using the CHG.  Do not shave (including legs and underarms) for at least 48 hours prior to first CHG shower. It is OK to shave your face.  Please follow these instructions carefully.   1. Shower the NIGHT BEFORE SURGERY and the MORNING OF SURGERY with CHG.   2. If you chose to wash your hair, wash your hair first as usual with your normal shampoo.  3. After you shampoo, rinse your hair and body thoroughly to remove the shampoo.  4. Use CHG as you would any other liquid soap. You can apply CHG directly to the skin and wash gently with a scrungie or a clean washcloth.   5. Apply the CHG Soap to your body ONLY FROM THE NECK DOWN.  Do not use on open wounds or open sores. Avoid contact with your eyes, ears, mouth and genitals (private parts). Wash genitals (private parts) with your normal soap.  6. Wash thoroughly, paying special attention to the area where your surgery will be performed.  7. Thoroughly rinse your body with warm water from  the neck down.  8. DO NOT shower/wash with your normal soap after using and rinsing off the CHG Soap.  9. Pat yourself dry with a CLEAN TOWEL.   10. Wear CLEAN PAJAMAS   11. Place CLEAN SHEETS on your bed the night of your first shower and DO NOT SLEEP WITH PETS.    Day of Surgery: Do not apply any deodorants/lotions. Please wear clean clothes to the hospital/surgery center.      Please read over the following fact sheets that you were given. MRSA Information

## 2016-03-23 ENCOUNTER — Encounter (HOSPITAL_COMMUNITY): Payer: Self-pay

## 2016-03-23 ENCOUNTER — Encounter (HOSPITAL_COMMUNITY)
Admission: RE | Admit: 2016-03-23 | Discharge: 2016-03-23 | Disposition: A | Discharge status: Home / Self Care | Payer: BC Managed Care – PPO | Source: Ambulatory Visit | Attending: Orthopedic Surgery | Admitting: Orthopedic Surgery

## 2016-03-23 ENCOUNTER — Ambulatory Visit (HOSPITAL_COMMUNITY)
Admission: RE | Admit: 2016-03-23 | Discharge: 2016-03-23 | Disposition: A | Discharge status: Home / Self Care | Payer: BC Managed Care – PPO | Source: Ambulatory Visit | Attending: Orthopedic Surgery | Admitting: Orthopedic Surgery

## 2016-03-23 DIAGNOSIS — Z01818 Encounter for other preprocedural examination: Secondary | ICD-10-CM

## 2016-03-23 DIAGNOSIS — Z01810 Encounter for preprocedural cardiovascular examination: Secondary | ICD-10-CM | POA: Insufficient documentation

## 2016-03-23 DIAGNOSIS — M1611 Unilateral primary osteoarthritis, right hip: Secondary | ICD-10-CM | POA: Diagnosis not present

## 2016-03-23 DIAGNOSIS — Z01812 Encounter for preprocedural laboratory examination: Secondary | ICD-10-CM | POA: Insufficient documentation

## 2016-03-23 DIAGNOSIS — K449 Diaphragmatic hernia without obstruction or gangrene: Secondary | ICD-10-CM | POA: Insufficient documentation

## 2016-03-23 HISTORY — DX: Other specified postprocedural states: Z98.890

## 2016-03-23 HISTORY — DX: Essential (primary) hypertension: I10

## 2016-03-23 LAB — URINALYSIS, ROUTINE W REFLEX MICROSCOPIC
Bilirubin Urine: NEGATIVE
GLUCOSE, UA: NEGATIVE mg/dL
Hgb urine dipstick: NEGATIVE
Ketones, ur: NEGATIVE mg/dL
NITRITE: NEGATIVE
PH: 6 (ref 5.0–8.0)
PROTEIN: NEGATIVE mg/dL
Specific Gravity, Urine: 1.012 (ref 1.005–1.030)

## 2016-03-23 LAB — CBC WITH DIFFERENTIAL/PLATELET
BASOS ABS: 0 10*3/uL (ref 0.0–0.1)
BASOS PCT: 0 %
Eosinophils Absolute: 0.2 10*3/uL (ref 0.0–0.7)
Eosinophils Relative: 3 %
HEMATOCRIT: 40.8 % (ref 36.0–46.0)
Hemoglobin: 13.5 g/dL (ref 12.0–15.0)
Lymphocytes Relative: 24 %
Lymphs Abs: 1.7 10*3/uL (ref 0.7–4.0)
MCH: 29.6 pg (ref 26.0–34.0)
MCHC: 33.1 g/dL (ref 30.0–36.0)
MCV: 89.5 fL (ref 78.0–100.0)
MONO ABS: 0.4 10*3/uL (ref 0.1–1.0)
Monocytes Relative: 6 %
NEUTROS ABS: 4.6 10*3/uL (ref 1.7–7.7)
Neutrophils Relative %: 67 %
PLATELETS: 259 10*3/uL (ref 150–400)
RBC: 4.56 MIL/uL (ref 3.87–5.11)
RDW: 14 % (ref 11.5–15.5)
WBC: 7 10*3/uL (ref 4.0–10.5)

## 2016-03-23 LAB — PROTIME-INR
INR: 0.96
Prothrombin Time: 12.8 seconds (ref 11.4–15.2)

## 2016-03-23 LAB — COMPREHENSIVE METABOLIC PANEL
ALBUMIN: 4 g/dL (ref 3.5–5.0)
ALT: 13 U/L — ABNORMAL LOW (ref 14–54)
AST: 18 U/L (ref 15–41)
Alkaline Phosphatase: 84 U/L (ref 38–126)
Anion gap: 7 (ref 5–15)
BILIRUBIN TOTAL: 0.7 mg/dL (ref 0.3–1.2)
BUN: 25 mg/dL — AB (ref 6–20)
CHLORIDE: 107 mmol/L (ref 101–111)
CO2: 25 mmol/L (ref 22–32)
Calcium: 9.4 mg/dL (ref 8.9–10.3)
Creatinine, Ser: 0.67 mg/dL (ref 0.44–1.00)
GFR calc Af Amer: >60 mL/min (ref >60)
GFR calc non Af Amer: >60 mL/min (ref >60)
GLUCOSE: 95 mg/dL (ref 65–99)
POTASSIUM: 4.4 mmol/L (ref 3.5–5.1)
SODIUM: 139 mmol/L (ref 135–145)
Total Protein: 6.5 g/dL (ref 6.5–8.1)

## 2016-03-23 LAB — TYPE AND SCREEN
ABO/RH(D): O POS
ANTIBODY SCREEN: NEGATIVE

## 2016-03-23 LAB — SURGICAL PCR SCREEN
MRSA, PCR: POSITIVE — AB
STAPHYLOCOCCUS AUREUS: POSITIVE — AB

## 2016-03-23 LAB — APTT: APTT: 29 s (ref 24–36)

## 2016-03-23 NOTE — Progress Notes (Signed)
PCP: Dr. Selena BattenWendy McNeil @ [facility]  Pt. Stated she saw a doctor in Oct. @ [facility] [place] blood pressure. Will request notes/ekg.

## 2016-03-27 ENCOUNTER — Inpatient Hospital Stay (HOSPITAL_COMMUNITY): Payer: BC Managed Care – PPO | Admitting: Anesthesiology

## 2016-03-27 ENCOUNTER — Inpatient Hospital Stay (HOSPITAL_COMMUNITY)
Admission: RE | Admit: 2016-03-27 | Discharge: 2016-03-28 | DRG: 470 | Disposition: A | Discharge status: Skilled Nursing Facility | Payer: BC Managed Care – PPO | Source: Ambulatory Visit | Attending: Orthopedic Surgery | Admitting: Orthopedic Surgery

## 2016-03-27 ENCOUNTER — Inpatient Hospital Stay (HOSPITAL_COMMUNITY): Payer: BC Managed Care – PPO

## 2016-03-27 ENCOUNTER — Encounter (HOSPITAL_COMMUNITY): Payer: Self-pay | Admitting: *Deleted

## 2016-03-27 ENCOUNTER — Encounter (HOSPITAL_COMMUNITY)
Admission: RE | Disposition: A | Discharge status: Skilled Nursing Facility | Payer: Self-pay | Source: Ambulatory Visit | Attending: Orthopedic Surgery

## 2016-03-27 DIAGNOSIS — E669 Obesity, unspecified: Secondary | ICD-10-CM | POA: Diagnosis present

## 2016-03-27 DIAGNOSIS — Z79899 Other long term (current) drug therapy: Secondary | ICD-10-CM

## 2016-03-27 DIAGNOSIS — Z888 Allergy status to other drugs, medicaments and biological substances status: Secondary | ICD-10-CM | POA: Diagnosis not present

## 2016-03-27 DIAGNOSIS — Z6833 Body mass index (BMI) 33.0-33.9, adult: Secondary | ICD-10-CM | POA: Diagnosis not present

## 2016-03-27 DIAGNOSIS — T754XXA Electrocution, initial encounter: Secondary | ICD-10-CM

## 2016-03-27 DIAGNOSIS — I10 Essential (primary) hypertension: Secondary | ICD-10-CM | POA: Diagnosis present

## 2016-03-27 DIAGNOSIS — G4730 Sleep apnea, unspecified: Secondary | ICD-10-CM | POA: Diagnosis present

## 2016-03-27 DIAGNOSIS — Z791 Long term (current) use of non-steroidal anti-inflammatories (NSAID): Secondary | ICD-10-CM

## 2016-03-27 DIAGNOSIS — Z96653 Presence of artificial knee joint, bilateral: Secondary | ICD-10-CM | POA: Diagnosis present

## 2016-03-27 DIAGNOSIS — Z882 Allergy status to sulfonamides status: Secondary | ICD-10-CM

## 2016-03-27 DIAGNOSIS — M1611 Unilateral primary osteoarthritis, right hip: Principal | ICD-10-CM | POA: Diagnosis present

## 2016-03-27 DIAGNOSIS — Z419 Encounter for procedure for purposes other than remedying health state, unspecified: Secondary | ICD-10-CM

## 2016-03-27 HISTORY — PX: TOTAL HIP ARTHROPLASTY: SHX124

## 2016-03-27 SURGERY — ARTHROPLASTY, HIP, TOTAL, ANTERIOR APPROACH
Anesthesia: Monitor Anesthesia Care | Laterality: Right

## 2016-03-27 MED ORDER — DOCUSATE SODIUM 100 MG PO CAPS
100.0000 mg | ORAL_CAPSULE | Freq: Two times a day (BID) | ORAL | Status: DC
  Administered 2016-03-27 – 2016-03-28 (×2): 100 mg via ORAL
  Filled 2016-03-27 (×2): qty 1

## 2016-03-27 MED ORDER — TIZANIDINE HCL 2 MG PO TABS: 2.0000 mg | ORAL_TABLET | Freq: Three times a day (TID) | ORAL | 0 refills | Status: DC | PRN

## 2016-03-27 MED ORDER — OXYCODONE HCL 5 MG PO TABS: 5.0000 mg | ORAL_TABLET | Freq: Once | ORAL | Status: DC | PRN

## 2016-03-27 MED ORDER — ASPIRIN EC 325 MG PO TBEC: 325.0000 mg | DELAYED_RELEASE_TABLET | Freq: Two times a day (BID) | ORAL | 0 refills | Status: DC

## 2016-03-27 MED ORDER — OXYCODONE HCL 5 MG PO TABS
ORAL_TABLET | ORAL | Status: AC
  Administered 2016-03-27: 10 mg
  Filled 2016-03-27: qty 2

## 2016-03-27 MED ORDER — FENTANYL CITRATE (PF) 100 MCG/2ML IJ SOLN
INTRAMUSCULAR | Status: AC
  Filled 2016-03-27: qty 2

## 2016-03-27 MED ORDER — ONDANSETRON HCL 4 MG/2ML IJ SOLN: 4.0000 mg | Freq: Once | INTRAMUSCULAR | Status: DC | PRN

## 2016-03-27 MED ORDER — PROPOFOL 500 MG/50ML IV EMUL
INTRAVENOUS | Status: DC | PRN
  Administered 2016-03-27: 50 ug/kg/min via INTRAVENOUS

## 2016-03-27 MED ORDER — DIPHENHYDRAMINE HCL 12.5 MG/5ML PO ELIX: 12.5000 mg | ORAL_SOLUTION | ORAL | Status: DC | PRN

## 2016-03-27 MED ORDER — ONDANSETRON HCL 4 MG/2ML IJ SOLN
INTRAMUSCULAR | Status: AC
  Filled 2016-03-27: qty 2

## 2016-03-27 MED ORDER — SODIUM CHLORIDE 0.9 % IV SOLN
INTRAVENOUS | Status: DC
  Administered 2016-03-27: 100 mL/h via INTRAVENOUS
  Administered 2016-03-28: 05:00:00 via INTRAVENOUS

## 2016-03-27 MED ORDER — ONDANSETRON HCL 4 MG PO TABS: 4.0000 mg | ORAL_TABLET | Freq: Four times a day (QID) | ORAL | Status: DC | PRN

## 2016-03-27 MED ORDER — LORATADINE 10 MG PO TABS
10.0000 mg | ORAL_TABLET | Freq: Every day | ORAL | Status: DC
  Administered 2016-03-28: 10 mg via ORAL
  Filled 2016-03-27: qty 1

## 2016-03-27 MED ORDER — FENTANYL CITRATE (PF) 100 MCG/2ML IJ SOLN: 25.0000 ug | INTRAMUSCULAR | Status: DC | PRN

## 2016-03-27 MED ORDER — ACETAMINOPHEN 325 MG PO TABS
ORAL_TABLET | ORAL | Status: AC
  Filled 2016-03-27: qty 2

## 2016-03-27 MED ORDER — ONDANSETRON HCL 4 MG/2ML IJ SOLN: 4.0000 mg | Freq: Four times a day (QID) | INTRAMUSCULAR | Status: DC | PRN

## 2016-03-27 MED ORDER — KETAMINE HCL-SODIUM CHLORIDE 100-0.9 MG/10ML-% IV SOSY
PREFILLED_SYRINGE | INTRAVENOUS | Status: AC
  Filled 2016-03-27: qty 20

## 2016-03-27 MED ORDER — MIDAZOLAM HCL 2 MG/2ML IJ SOLN
INTRAMUSCULAR | Status: AC
  Filled 2016-03-27: qty 2

## 2016-03-27 MED ORDER — KETOROLAC TROMETHAMINE 15 MG/ML IJ SOLN
INTRAMUSCULAR | Status: AC
  Filled 2016-03-27: qty 1

## 2016-03-27 MED ORDER — METHOCARBAMOL 500 MG PO TABS
500.0000 mg | ORAL_TABLET | Freq: Four times a day (QID) | ORAL | Status: DC | PRN
  Administered 2016-03-27 – 2016-03-28 (×3): 500 mg via ORAL
  Filled 2016-03-27 (×3): qty 1

## 2016-03-27 MED ORDER — OXYCODONE HCL 5 MG/5ML PO SOLN: 5.0000 mg | Freq: Once | ORAL | Status: DC | PRN

## 2016-03-27 MED ORDER — BUPIVACAINE LIPOSOME 1.3 % IJ SUSP
20.0000 mL | Freq: Once | INTRAMUSCULAR | Status: DC
  Filled 2016-03-27: qty 20

## 2016-03-27 MED ORDER — LACTATED RINGERS IV SOLN
INTRAVENOUS | Status: DC | PRN
  Administered 2016-03-27 (×3): via INTRAVENOUS

## 2016-03-27 MED ORDER — POLYETHYLENE GLYCOL 3350 17 G PO PACK: 17.0000 g | PACK | Freq: Every day | ORAL | Status: DC | PRN

## 2016-03-27 MED ORDER — METHOCARBAMOL 500 MG PO TABS
ORAL_TABLET | ORAL | Status: AC
  Filled 2016-03-27: qty 1

## 2016-03-27 MED ORDER — VANCOMYCIN HCL IN DEXTROSE 1-5 GM/200ML-% IV SOLN
INTRAVENOUS | Status: AC
  Filled 2016-03-27: qty 200

## 2016-03-27 MED ORDER — METHOCARBAMOL 750 MG PO TABS: 750.0000 mg | ORAL_TABLET | Freq: Three times a day (TID) | ORAL | 0 refills | Status: DC | PRN

## 2016-03-27 MED ORDER — DOCUSATE SODIUM 100 MG PO CAPS: 100.0000 mg | ORAL_CAPSULE | Freq: Two times a day (BID) | ORAL | 0 refills | Status: DC

## 2016-03-27 MED ORDER — OXYCODONE HCL 5 MG PO TABS
5.0000 mg | ORAL_TABLET | ORAL | Status: DC | PRN
  Administered 2016-03-27 – 2016-03-28 (×6): 10 mg via ORAL
  Filled 2016-03-27 (×6): qty 2

## 2016-03-27 MED ORDER — BISACODYL 5 MG PO TBEC: 5.0000 mg | DELAYED_RELEASE_TABLET | Freq: Every day | ORAL | Status: DC | PRN

## 2016-03-27 MED ORDER — MAGNESIUM CITRATE PO SOLN: 1.0000 | Freq: Once | ORAL | Status: DC | PRN

## 2016-03-27 MED ORDER — ACETAMINOPHEN 650 MG RE SUPP: 650.0000 mg | Freq: Four times a day (QID) | RECTAL | Status: DC | PRN

## 2016-03-27 MED ORDER — FENTANYL CITRATE (PF) 100 MCG/2ML IJ SOLN
25.0000 ug | INTRAMUSCULAR | Status: DC | PRN
  Administered 2016-03-27 (×2): 50 ug via INTRAVENOUS

## 2016-03-27 MED ORDER — METHOCARBAMOL 1000 MG/10ML IJ SOLN
500.0000 mg | Freq: Four times a day (QID) | INTRAVENOUS | Status: DC | PRN
  Filled 2016-03-27: qty 5

## 2016-03-27 MED ORDER — CHLORHEXIDINE GLUCONATE 4 % EX LIQD: 60.0000 mL | Freq: Once | CUTANEOUS | Status: DC

## 2016-03-27 MED ORDER — DEXAMETHASONE SODIUM PHOSPHATE 10 MG/ML IJ SOLN
10.0000 mg | Freq: Two times a day (BID) | INTRAMUSCULAR | Status: DC
  Administered 2016-03-27 – 2016-03-28 (×2): 10 mg via INTRAVENOUS
  Filled 2016-03-27 (×2): qty 1

## 2016-03-27 MED ORDER — ALUM & MAG HYDROXIDE-SIMETH 200-200-20 MG/5ML PO SUSP: 30.0000 mL | ORAL | Status: DC | PRN

## 2016-03-27 MED ORDER — PANTOPRAZOLE SODIUM 40 MG PO TBEC
80.0000 mg | DELAYED_RELEASE_TABLET | Freq: Every day | ORAL | Status: DC
  Administered 2016-03-28: 80 mg via ORAL
  Filled 2016-03-27: qty 2

## 2016-03-27 MED ORDER — CITALOPRAM HYDROBROMIDE 40 MG PO TABS
40.0000 mg | ORAL_TABLET | Freq: Every morning | ORAL | Status: DC
  Administered 2016-03-28: 40 mg via ORAL
  Filled 2016-03-27: qty 1

## 2016-03-27 MED ORDER — BUPIVACAINE HCL 0.5 % IJ SOLN
INTRAMUSCULAR | Status: DC | PRN
  Administered 2016-03-27: 20 mL

## 2016-03-27 MED ORDER — BUPIVACAINE LIPOSOME 1.3 % IJ SUSP
INTRAMUSCULAR | Status: DC | PRN
  Administered 2016-03-27: 20 mL

## 2016-03-27 MED ORDER — FERROUS SULFATE 325 (65 FE) MG PO TABS
325.0000 mg | ORAL_TABLET | Freq: Every day | ORAL | Status: DC
  Administered 2016-03-28: 325 mg via ORAL
  Filled 2016-03-27: qty 1

## 2016-03-27 MED ORDER — 0.9 % SODIUM CHLORIDE (POUR BTL) OPTIME
TOPICAL | Status: DC | PRN
  Administered 2016-03-27: 1000 mL

## 2016-03-27 MED ORDER — LIDOCAINE 2% (20 MG/ML) 5 ML SYRINGE
INTRAMUSCULAR | Status: AC
  Filled 2016-03-27: qty 5

## 2016-03-27 MED ORDER — DOXYCYCLINE HYCLATE 100 MG PO CAPS: 100.0000 mg | ORAL_CAPSULE | Freq: Two times a day (BID) | ORAL | 0 refills | Status: DC

## 2016-03-27 MED ORDER — LISINOPRIL 10 MG PO TABS
10.0000 mg | ORAL_TABLET | Freq: Every day | ORAL | Status: DC
  Administered 2016-03-27 – 2016-03-28 (×2): 10 mg via ORAL
  Filled 2016-03-27 (×2): qty 1

## 2016-03-27 MED ORDER — BUPROPION HCL ER (XL) 300 MG PO TB24
450.0000 mg | ORAL_TABLET | Freq: Every day | ORAL | Status: DC
  Administered 2016-03-28: 450 mg via ORAL
  Filled 2016-03-27: qty 1

## 2016-03-27 MED ORDER — KETAMINE HCL 10 MG/ML IJ SOLN
INTRAMUSCULAR | Status: DC | PRN
  Administered 2016-03-27: 15 mg via INTRAVENOUS
  Administered 2016-03-27: 10 mg via INTRAVENOUS
  Administered 2016-03-27: 25 mg via INTRAVENOUS

## 2016-03-27 MED ORDER — MIDAZOLAM HCL 5 MG/5ML IJ SOLN
INTRAMUSCULAR | Status: DC | PRN
  Administered 2016-03-27 (×3): 1 mg via INTRAVENOUS

## 2016-03-27 MED ORDER — ASPIRIN EC 325 MG PO TBEC
325.0000 mg | DELAYED_RELEASE_TABLET | Freq: Two times a day (BID) | ORAL | Status: DC
  Administered 2016-03-27 – 2016-03-28 (×2): 325 mg via ORAL
  Filled 2016-03-27 (×2): qty 1

## 2016-03-27 MED ORDER — VANCOMYCIN HCL IN DEXTROSE 1-5 GM/200ML-% IV SOLN
1000.0000 mg | Freq: Two times a day (BID) | INTRAVENOUS | Status: AC
  Administered 2016-03-27 – 2016-03-28 (×2): 1000 mg via INTRAVENOUS
  Filled 2016-03-27 (×2): qty 200

## 2016-03-27 MED ORDER — HYDROMORPHONE HCL 2 MG/ML IJ SOLN
0.5000 mg | INTRAMUSCULAR | Status: DC | PRN
  Filled 2016-03-27: qty 1

## 2016-03-27 MED ORDER — FENTANYL CITRATE (PF) 100 MCG/2ML IJ SOLN
INTRAMUSCULAR | Status: AC
  Administered 2016-03-27: 50 ug via INTRAVENOUS
  Filled 2016-03-27: qty 2

## 2016-03-27 MED ORDER — TRANEXAMIC ACID 1000 MG/10ML IV SOLN
1000.0000 mg | INTRAVENOUS | Status: AC
  Administered 2016-03-27: 1000 mg via INTRAVENOUS
  Filled 2016-03-27: qty 10

## 2016-03-27 MED ORDER — ACETAMINOPHEN 325 MG PO TABS
650.0000 mg | ORAL_TABLET | Freq: Four times a day (QID) | ORAL | Status: DC | PRN
  Administered 2016-03-27: 650 mg via ORAL

## 2016-03-27 MED ORDER — BUPIVACAINE HCL (PF) 0.5 % IJ SOLN
INTRAMUSCULAR | Status: AC
  Filled 2016-03-27: qty 30

## 2016-03-27 MED ORDER — KETOROLAC TROMETHAMINE 15 MG/ML IJ SOLN
15.0000 mg | Freq: Three times a day (TID) | INTRAMUSCULAR | Status: DC
  Administered 2016-03-27 – 2016-03-28 (×3): 15 mg via INTRAVENOUS
  Filled 2016-03-27 (×2): qty 1

## 2016-03-27 MED ORDER — VANCOMYCIN HCL 1000 MG IV SOLR
INTRAVENOUS | Status: DC | PRN
  Administered 2016-03-27: 1000 mg via INTRAVENOUS

## 2016-03-27 MED ORDER — TRAZODONE 25 MG HALF TABLET
25.0000 mg | ORAL_TABLET | Freq: Every evening | ORAL | Status: DC | PRN
  Filled 2016-03-27: qty 4

## 2016-03-27 MED ORDER — HYDROMORPHONE HCL 1 MG/ML IJ SOLN: 0.5000 mg | INTRAMUSCULAR | Status: DC | PRN

## 2016-03-27 MED ORDER — PROPOFOL 10 MG/ML IV BOLUS
INTRAVENOUS | Status: AC
  Filled 2016-03-27: qty 40

## 2016-03-27 MED ORDER — ZOLPIDEM TARTRATE 5 MG PO TABS: 5.0000 mg | ORAL_TABLET | Freq: Every evening | ORAL | Status: DC | PRN

## 2016-03-27 MED ORDER — FENTANYL CITRATE (PF) 100 MCG/2ML IJ SOLN
INTRAMUSCULAR | Status: DC | PRN
  Administered 2016-03-27 (×2): 50 ug via INTRAVENOUS

## 2016-03-27 MED ORDER — CEFAZOLIN SODIUM-DEXTROSE 2-4 GM/100ML-% IV SOLN
2.0000 g | INTRAVENOUS | Status: AC
  Administered 2016-03-27: 2 g via INTRAVENOUS
  Filled 2016-03-27: qty 100

## 2016-03-27 MED ORDER — LURASIDONE HCL 20 MG PO TABS
20.0000 mg | ORAL_TABLET | Freq: Every evening | ORAL | Status: DC
Start: 2016-03-27 — End: 2016-03-28
  Administered 2016-03-27: 20 mg via ORAL
  Filled 2016-03-27 (×2): qty 1

## 2016-03-27 MED ORDER — TRANEXAMIC ACID 1000 MG/10ML IV SOLN
1000.0000 mg | Freq: Once | INTRAVENOUS | Status: DC
  Filled 2016-03-27: qty 10

## 2016-03-27 SURGICAL SUPPLY — 66 items
APL SKNCLS STERI-STRIP NONHPOA (GAUZE/BANDAGES/DRESSINGS) ×1
BENZOIN TINCTURE PRP APPL 2/3 (GAUZE/BANDAGES/DRESSINGS) ×2 IMPLANT
BLADE SAW SGTL 18X1.27X75 (BLADE) ×2 IMPLANT
BLADE SURG ROTATE 9660 (MISCELLANEOUS) IMPLANT
BNDG COHESIVE 6X5 TAN STRL LF (GAUZE/BANDAGES/DRESSINGS) IMPLANT
BNDG GAUZE ELAST 4 BULKY (GAUZE/BANDAGES/DRESSINGS) IMPLANT
CAPT HIP TOTAL 2 ×1 IMPLANT
CELLS DAT CNTRL 66122 CELL SVR (MISCELLANEOUS) IMPLANT
CLSR STERI-STRIP ANTIMIC 1/2X4 (GAUZE/BANDAGES/DRESSINGS) IMPLANT
COVER PERINEAL POST (MISCELLANEOUS) ×2 IMPLANT
COVER SURGICAL LIGHT HANDLE (MISCELLANEOUS) ×2 IMPLANT
DRAPE C-ARM 42X72 X-RAY (DRAPES) ×2 IMPLANT
DRAPE STERI IOBAN 125X83 (DRAPES) ×2 IMPLANT
DRAPE U-SHAPE 47X51 STRL (DRAPES) ×4 IMPLANT
DRESSING AQUACEL AG SP 3.5X10 (GAUZE/BANDAGES/DRESSINGS) IMPLANT
DRSG AQUACEL AG ADV 3.5X10 (GAUZE/BANDAGES/DRESSINGS) ×2 IMPLANT
DRSG AQUACEL AG SP 3.5X10 (GAUZE/BANDAGES/DRESSINGS) ×2
DURAPREP 26ML APPLICATOR (WOUND CARE) ×2 IMPLANT
ELECT BLADE 4.0 EZ CLEAN MEGAD (MISCELLANEOUS)
ELECT CAUTERY BLADE 6.4 (BLADE) ×2 IMPLANT
ELECT REM PT RETURN 9FT ADLT (ELECTROSURGICAL) ×2
ELECTRODE BLDE 4.0 EZ CLN MEGD (MISCELLANEOUS) IMPLANT
ELECTRODE REM PT RTRN 9FT ADLT (ELECTROSURGICAL) ×1 IMPLANT
GAUZE XEROFORM 1X8 LF (GAUZE/BANDAGES/DRESSINGS) ×2 IMPLANT
GLOVE BIOGEL PI IND STRL 6.5 (GLOVE) IMPLANT
GLOVE BIOGEL PI IND STRL 7.0 (GLOVE) IMPLANT
GLOVE BIOGEL PI IND STRL 8 (GLOVE) ×2 IMPLANT
GLOVE BIOGEL PI INDICATOR 6.5 (GLOVE) ×1
GLOVE BIOGEL PI INDICATOR 7.0 (GLOVE) ×1
GLOVE BIOGEL PI INDICATOR 8 (GLOVE) ×2
GLOVE ECLIPSE 7.5 STRL STRAW (GLOVE) ×4 IMPLANT
GLOVE SURG SS PI 6.5 STRL IVOR (GLOVE) ×1 IMPLANT
GLOVE SURG SS PI 7.0 STRL IVOR (GLOVE) ×1 IMPLANT
GOWN STRL REUS W/ TWL LRG LVL3 (GOWN DISPOSABLE) ×2 IMPLANT
GOWN STRL REUS W/ TWL XL LVL3 (GOWN DISPOSABLE) ×2 IMPLANT
GOWN STRL REUS W/TWL LRG LVL3 (GOWN DISPOSABLE) ×4
GOWN STRL REUS W/TWL XL LVL3 (GOWN DISPOSABLE) ×4
HOOD PEEL AWAY FACE SHEILD DIS (HOOD) ×4 IMPLANT
KIT BASIN OR (CUSTOM PROCEDURE TRAY) ×2 IMPLANT
KIT ROOM TURNOVER OR (KITS) ×2 IMPLANT
MANIFOLD NEPTUNE II (INSTRUMENTS) ×2 IMPLANT
NDL SPNL 22GX3.5 QUINCKE BK (NEEDLE) ×1 IMPLANT
NEEDLE SPNL 22GX3.5 QUINCKE BK (NEEDLE) ×2 IMPLANT
NS IRRIG 1000ML POUR BTL (IV SOLUTION) ×2 IMPLANT
PACK TOTAL JOINT (CUSTOM PROCEDURE TRAY) ×2 IMPLANT
PACK UNIVERSAL I (CUSTOM PROCEDURE TRAY) ×2 IMPLANT
PAD ARMBOARD 7.5X6 YLW CONV (MISCELLANEOUS) ×4 IMPLANT
RTRCTR WOUND ALEXIS 18CM MED (MISCELLANEOUS)
RTRCTR WOUND ALEXIS 18CM SML (INSTRUMENTS) ×2
SAVER CELL AAL HAEMONETICS (INSTRUMENTS) ×1 IMPLANT
SPONGE LAP 18X18 X RAY DECT (DISPOSABLE) IMPLANT
STAPLER VISISTAT 35W (STAPLE) IMPLANT
SUT ETHIBOND NAB CT1 #1 30IN (SUTURE) ×4 IMPLANT
SUT MNCRL AB 3-0 PS2 18 (SUTURE) IMPLANT
SUT VIC AB 0 CT1 27 (SUTURE) ×2
SUT VIC AB 0 CT1 27XBRD ANBCTR (SUTURE) ×1 IMPLANT
SUT VIC AB 1 CT1 27 (SUTURE) ×4
SUT VIC AB 1 CT1 27XBRD ANBCTR (SUTURE) ×2 IMPLANT
SUT VIC AB 2-0 CT1 27 (SUTURE) ×2
SUT VIC AB 2-0 CT1 TAPERPNT 27 (SUTURE) ×1 IMPLANT
SYR 50ML LL SCALE MARK (SYRINGE) ×2 IMPLANT
TOWEL OR 17X24 6PK STRL BLUE (TOWEL DISPOSABLE) ×2 IMPLANT
TOWEL OR 17X26 10 PK STRL BLUE (TOWEL DISPOSABLE) ×2 IMPLANT
TRAY CATH 16FR W/PLASTIC CATH (SET/KITS/TRAYS/PACK) IMPLANT
TRAY FOLEY CATH 16FR SILVER (SET/KITS/TRAYS/PACK) IMPLANT
WATER STERILE IRR 1000ML POUR (IV SOLUTION) ×4 IMPLANT

## 2016-03-27 NOTE — [place] FL2 (Signed)
[place] MEDICAID FL2 LEVEL OF CARE SCREENING TOOL     IDENTIFICATION  Patient Name: Lauren Huffman Birthdate: 1956/03/10 Sex: female Admission Date (Current Location): 03/27/2016  [facility] [place]   Facility and Address:  [facility] [place], [address], [place], [place] [address]      Provider Number: [id number]  Attending Physician Name and Address:  Jodi GeraldsJohn Graves, MD  Relative Name and Phone Number:       Current Level of Care: Hospital Recommended Level of Care: Skilled Nursing Facility Prior Approval Number:    Date Approved/Denied: 12/28/11 PASRR Number: [id number]  Discharge Plan: SNF    Current Diagnoses: Patient Active Problem List   Diagnosis Date Noted  . Primary osteoarthritis of right hip 03/27/2016  . CTS (carpal tunnel syndrome) 09/17/2015  . Left elbow pain 06/20/2015  . Paresthesia 06/20/2015  . Osteoarthritis of left knee 07/08/2012  . Obesity 12/28/2011  . Osteoarthritis of right knee 12/25/2011    Orientation RESPIRATION BLADDER Height & Weight     Self, Time, Situation, Place  Normal Continent Weight: 236 lb (107 kg) Height:     BEHAVIORAL SYMPTOMS/MOOD NEUROLOGICAL BOWEL NUTRITION STATUS      Continent  (Please see discharge summary)  AMBULATORY STATUS COMMUNICATION OF NEEDS Skin   Limited Assist Verbally Surgical wounds (Closed incision right hip)                       Personal Care Assistance Level of Assistance  Bathing, Feeding, Dressing Bathing Assistance: Limited assistance Feeding assistance: Independent Dressing Assistance: Limited assistance     Functional Limitations Info  Sight, Hearing, Speech Sight Info: Adequate Hearing Info: Adequate Speech Info: Adequate    SPECIAL CARE FACTORS FREQUENCY  PT (By licensed PT), OT (By licensed OT)     PT Frequency: 5x week OT Frequency: 5x week            Contractures Contractures Info: Not present    Additional Factors Info   Code Status, Allergies, Isolation Precautions Code Status Info: Full Allergies Info: Gabapentin, Metoprolol, Sulfa Antibiotics     Isolation Precautions Info: Contact precaution; MRSA     Current Medications (03/27/2016):  This is the current hospital active medication list Current Facility-Administered Medications  Medication Dose Route Frequency Provider Last Rate Last Dose  . 0.9 %  sodium chloride infusion   Intravenous Continuous Marshia LyJames Bethune, PA-C      . acetaminophen (TYLENOL) 325 MG tablet           . acetaminophen (TYLENOL) tablet 650 mg  650 mg Oral Q6H PRN Marshia LyJames Bethune, PA-C   650 mg at 03/27/16 1102   Or  . acetaminophen (TYLENOL) suppository 650 mg  650 mg Rectal Q6H PRN Marshia LyJames Bethune, PA-C      . alum & mag hydroxide-simeth (MAALOX/MYLANTA) 200-200-20 MG/5ML suspension 30 mL  30 mL Oral Q4H PRN Marshia LyJames Bethune, PA-C      . aspirin EC tablet 325 mg  325 mg Oral BID PC Marshia LyJames Bethune, PA-C      . bisacodyl (DULCOLAX) EC tablet 5 mg  5 mg Oral Daily PRN Marshia LyJames Bethune, PA-C      . Melene Muller[START ON 03/28/2016] buPROPion (WELLBUTRIN XL) 24 hr tablet 450 mg  450 mg Oral Daily Marshia LyJames Bethune, PA-C      . Melene Muller[START ON 03/28/2016] citalopram (CELEXA) tablet 40 mg  40 mg Oral q morning - 10a Marshia LyJames Bethune, PA-C      .  dexamethasone (DECADRON) injection 10 mg  10 mg Intravenous Q12H Marshia LyJames Bethune, PA-C      . diphenhydrAMINE (BENADRYL) 12.5 MG/5ML elixir 12.5-25 mg  12.5-25 mg Oral Q4H PRN Marshia LyJames Bethune, PA-C      . docusate sodium (COLACE) capsule 100 mg  100 mg Oral BID Marshia LyJames Bethune, PA-C      . Melene Muller[START ON 03/28/2016] ferrous sulfate tablet 325 mg  325 mg Oral Q breakfast Marshia LyJames Bethune, PA-C      . HYDROmorphone (DILAUDID) injection 0.5-1 mg  0.5-1 mg Intravenous Q2H PRN Jodi GeraldsJohn Graves, MD      . ketorolac (TORADOL) 15 MG/ML injection 15 mg  15 mg Intravenous Q8H Marshia LyJames Bethune, PA-C   15 mg at 03/27/16 1100  . ketorolac (TORADOL) 15 MG/ML injection           . lisinopril (PRINIVIL,ZESTRIL) tablet  10 mg  10 mg Oral Daily Marshia LyJames Bethune, PA-C      . Melene Muller[START ON 03/28/2016] loratadine (CLARITIN) tablet 10 mg  10 mg Oral Daily Marshia LyJames Bethune, PA-C      . lurasidone (LATUDA) tablet 20 mg  20 mg Oral QPM Marshia LyJames Bethune, PA-C      . magnesium citrate solution 1 Bottle  1 Bottle Oral Once PRN Marshia LyJames Bethune, PA-C      . methocarbamol (ROBAXIN) tablet 500 mg  500 mg Oral Q6H PRN Marshia LyJames Bethune, PA-C   500 mg at 03/27/16 1101   Or  . methocarbamol (ROBAXIN) 500 mg in dextrose 5 % 50 mL IVPB  500 mg Intravenous Q6H PRN Marshia LyJames Bethune, PA-C      . methocarbamol (ROBAXIN) 500 MG tablet           . ondansetron (ZOFRAN) tablet 4 mg  4 mg Oral Q6H PRN Marshia LyJames Bethune, PA-C       Or  . ondansetron [facility](ZOFRAN) injection 4 mg  4 mg Intravenous Q6H PRN Marshia LyJames Bethune, PA-C      . oxyCODONE (Oxy IR/ROXICODONE) 5 MG immediate release tablet           . oxyCODONE (Oxy IR/ROXICODONE) immediate release tablet 5-10 mg  5-10 mg Oral Q3H PRN Marshia LyJames Bethune, PA-C   10 mg at 03/27/16 1401  . [START ON 03/28/2016] pantoprazole (PROTONIX) EC tablet 80 mg  80 mg Oral Daily Marshia LyJames Bethune, PA-C      . polyethylene glycol (MIRALAX / GLYCOLAX) packet 17 g  17 g Oral Daily PRN Marshia LyJames Bethune, PA-C      . tranexamic acid (CYKLOKAPRON) 1,000 mg in sodium chloride 0.9 % 100 mL IVPB  1,000 mg Intravenous Once Marshia LyJames Bethune, PA-C      . traZODone (DESYREL) tablet 25-100 mg  25-100 mg Oral QHS PRN Marshia LyJames Bethune, PA-C      . vancomycin (VANCOCIN) 1-5 GM/200ML-% IVPB           . vancomycin (VANCOCIN) IVPB 1000 mg/200 mL premix  1,000 mg Intravenous Q12H Marshia LyJames Bethune, PA-C      . zolpidem (AMBIEN) tablet 5 mg  5 mg Oral QHS PRN Marshia LyJames Bethune, PA-C         Discharge Medications: Please see discharge summary for a list of discharge medications.  Relevant Imaging Results:  Relevant Lab Results:   Additional Information SSN: [id number]  [place] AmericanBridget A Mayton, LCSW

## 2016-03-27 NOTE — H&P (Signed)
TOTAL HIP ADMISSION H&P  Patient is admitted for right total hip arthroplasty.  Subjective:  Chief Complaint: right hip pain  HPI: Lauren Huffman, 60 y.o. female, has a history of pain and functional disability in the right hip(s) due to arthritis and patient has failed non-surgical conservative treatments for greater than 12 weeks to include NSAID's and/or analgesics, weight reduction as appropriate and activity modification.  Onset of symptoms was gradual starting 5 years ago with gradually worsening course since that time.The patient noted no past surgery on the right hip(s).  Patient currently rates pain in the right hip at 10 out of 10 with activity. Patient has night pain, worsening of pain with activity and weight bearing, trendelenberg gait, pain that interfers with activities of daily living, pain with passive range of motion, crepitus and joint swelling. Patient has evidence of subchondral cysts, subchondral sclerosis, periarticular osteophytes, joint subluxation and joint space narrowing by imaging studies. This condition presents safety issues increasing the risk of falls. This patient has had failure of all rasonable conservative care.  There is no current active infection.  Patient Active Problem List   Diagnosis Date Noted  . CTS (carpal tunnel syndrome) 09/17/2015  . Left elbow pain 06/20/2015  . Paresthesia 06/20/2015  . Osteoarthritis of left knee 07/08/2012  . Obesity 12/28/2011  . Osteoarthritis of right knee 12/25/2011   Past Medical History:  Diagnosis Date  . Anxiety   . Arthritis   . Carpal tunnel syndrome    Right  . Depression   . Headache(784.0)   . Hiatal hernia   . Hyperlipemia   . Hypertension   . Left arm pain   . Mental disorder   . Osteoarthritis   . Sleep apnea    DOES NOT WEAR CPAP    . Status post ablation of incompetent vein using laser 2x in Nov. 2017   right  . TMJ (dislocation of temporomandibular joint)   . Varicose veins     Past Surgical  History:  Procedure Laterality Date  . CYST REMOVAL HAND Left   . JOINT REPLACEMENT Right    knee  . KNEE ARTHROPLASTY  12/25/2011   Procedure: COMPUTER ASSISTED TOTAL KNEE ARTHROPLASTY;  Surgeon: Alta Corning, MD;  Location: [facility];  Service: Orthopedics;  Laterality: Right;  Right total knee replacement, general anesthesia, femoral nerve block  . KNEE ARTHROPLASTY Left 07/08/2012   Procedure: COMPUTER ASSISTED TOTAL KNEE ARTHROPLASTY;  Surgeon: Alta Corning, MD;  Location: [facility];  Service: Orthopedics;  Laterality: Left;  PRE OP FEMORAL NERVE BLOCK  . TOTAL KNEE ARTHROPLASTY Left 07/08/2012   Dr Berenice Primas    Prescriptions Prior to Admission  Medication Sig Dispense Refill Last Dose  . acetaminophen (TYLENOL) 500 MG tablet Take 1,000 mg by mouth every 6 (six) hours as needed for mild pain.   Past Week at Unknown time  . Ascorbic Acid (VITAMIN C PO) Take 1-2 tablets by mouth daily. TAKES 1 DAILY 2 IF FEELS LIKE GETTING SICK   Past Month at Unknown time  . buPROPion (WELLBUTRIN XL) 150 MG 24 hr tablet Take 450 mg by mouth daily.    03/27/2016 at Unknown time  . Cholecalciferol (VITAMIN D-3) 1000 UNITS CAPS Take 1,000 Units by mouth daily.    03/26/2016 at Unknown time  . citalopram (CELEXA) 40 MG tablet Take 40 mg by mouth every morning.   03/27/2016 at 0430  . Cyanocobalamin (B-12) 1000 MCG SUBL Place 1,000 mcg under the tongue 2 (two)  times daily as needed (ENERGY).   Past Month at Unknown time  . diclofenac (VOLTAREN) 75 MG EC tablet Take 75 mg by mouth 2 (two) times daily.   Past Week at Unknown time  . ferrous sulfate 325 (65 FE) MG tablet Take 325 mg by mouth daily with breakfast.   Past Week at Unknown time  . folic acid (FOLVITE) 1 MG tablet Take 2 mg by mouth daily.    03/26/2016 at Unknown time  . lisinopril (PRINIVIL,ZESTRIL) 10 MG tablet Take 10 mg by mouth daily.   03/26/2016 at Unknown time  . loratadine (CLARITIN) 10 MG tablet Take 10 mg by mouth daily.   03/27/2016 at 0430  .  lurasidone (LATUDA) 20 MG TABS tablet Take 20 mg by mouth every evening.   03/26/2016 at Unknown time  . meloxicam (MOBIC) 15 MG tablet Take 15 mg by mouth every evening.   Past Week at Unknown time  . Multiple Vitamins-Minerals (MULTIVITAMIN GUMMIES ADULT PO) Take 2 tablets by mouth daily.   03/26/2016 at Unknown time  . nitrofurantoin, macrocrystal-monohydrate, (MACROBID) 100 MG capsule Take 100 mg by mouth 2 (two) times daily.   03/27/2016 at 0430  . omeprazole (PRILOSEC) 40 MG capsule TAKE 1 CAPSULE BY MOUTH TWICE DAILY  11 03/27/2016 at 0430  . Probiotic Product (ALIGN PO) Take 1 tablet by mouth daily.   03/26/2016 at Unknown time  . Simethicone (GAS-X PO) Take 1 tablet by mouth 4 (four) times daily - after meals and at bedtime.    03/26/2016 at Unknown time  . traMADol (ULTRAM) 50 MG tablet Take by mouth 3 (three) times daily as needed.   03/27/2016 at 0430  . celecoxib (CELEBREX) 100 MG capsule Take 1 capsule (100 mg total) by mouth 2 (two) times daily. (Patient not taking: Reported on 03/18/2016) 60 capsule 11 Not Taking at Unknown time  . topiramate (TOPAMAX) 50 MG tablet Take 2 tablets (100 mg total) by mouth 2 (two) times daily. (Patient not taking: Reported on 03/18/2016) 60 tablet 1 Not Taking at Unknown time  . traMADol (ULTRAM) 50 MG tablet Take 1-2 tablets every 8 hours as needed for pain. 120 tablet 5   . traZODone (DESYREL) 50 MG tablet Take 25-100 mg by mouth at bedtime as needed for sleep. HAS NOT STARTED TAKING YET BUT PLANS TO START      Allergies  Allergen Reactions  . Gabapentin Other (See Comments)    Weakness and muscle contractions  . Metoprolol Other (See Comments)    INCREASED DEPRESSION   . Sulfa Antibiotics     Family History    Social History  Substance Use Topics  . Smoking status: Never Smoker  . Smokeless tobacco: Never Used  . Alcohol use No    Family History  Problem Relation Age of Onset  . Stroke Mother   . Seizures Mother   . Memory loss Mother    . Cancer Father     Salivary Gland   . Heart disease Father      ROS ROS: I have reviewed the patient's review of systems thoroughly and there are no positive responses as relates to the HPI. Objective:  Physical Exam  Vital signs in last 24 hours: Temp:  [98.2 F (36.8 C)] 98.2 F (36.8 C) (12/22 0603) Pulse Rate:  [94] 94 (12/22 0603) Resp:  [20] 20 (12/22 0603) BP: (139)/(82) 139/82 (12/22 0603) SpO2:  [98 %] 98 % (12/22 0603) Weight:  [107 kg (236 lb)] 107 kg (  236 lb) (12/22 0603) Well-developed well-nourished patient in no acute distress. Alert and oriented x3 HEENT:within normal limits Cardiac: Regular rate and rhythm Pulmonary: Lungs clear to auscultation Abdomen: Soft and nontender.  Normal active bowel sounds  Musculoskeletal: (Rhip: painful rom limited int rotatrion Labs:  Recent Results (from the past 2160 hour(s))  APTT     Status: None   Collection Time: 03/23/16  8:41 AM  Result Value Ref Range   aPTT 29 24 - 36 seconds  CBC WITH DIFFERENTIAL     Status: None   Collection Time: 03/23/16  8:41 AM  Result Value Ref Range   WBC 7.0 4.0 - 10.5 K/uL   RBC 4.56 3.87 - 5.11 MIL/uL   Hemoglobin 13.5 12.0 - 15.0 g/dL   HCT 40.8 36.0 - 46.0 %   MCV 89.5 78.0 - 100.0 fL   MCH 29.6 26.0 - 34.0 pg   MCHC 33.1 30.0 - 36.0 g/dL   RDW 14.0 11.5 - 15.5 %   Platelets 259 150 - 400 K/uL   Neutrophils Relative % 67 %   Neutro Abs 4.6 1.7 - 7.7 K/uL   Lymphocytes Relative 24 %   Lymphs Abs 1.7 0.7 - 4.0 K/uL   Monocytes Relative 6 %   Monocytes Absolute 0.4 0.1 - 1.0 K/uL   Eosinophils Relative 3 %   Eosinophils Absolute 0.2 0.0 - 0.7 K/uL   Basophils Relative 0 %   Basophils Absolute 0.0 0.0 - 0.1 K/uL  Comprehensive metabolic panel     Status: Abnormal   Collection Time: 03/23/16  8:41 AM  Result Value Ref Range   Sodium 139 135 - 145 mmol/L   Potassium 4.4 3.5 - 5.1 mmol/L   Chloride 107 101 - 111 mmol/L   CO2 25 22 - 32 mmol/L   Glucose, Bld 95 65 - 99  mg/dL   BUN 25 (H) 6 - 20 mg/dL   Creatinine, Ser 0.67 0.44 - 1.00 mg/dL   Calcium 9.4 8.9 - 10.3 mg/dL   Total Protein 6.5 6.5 - 8.1 g/dL   Albumin 4.0 3.5 - 5.0 g/dL   AST 18 15 - 41 U/L   ALT 13 (L) 14 - 54 U/L   Alkaline Phosphatase 84 38 - 126 U/L   Total Bilirubin 0.7 0.3 - 1.2 mg/dL   GFR calc non Af Amer >60 >60 mL/min   GFR calc Af Amer >60 >60 mL/min    Comment: (NOTE) The eGFR has been calculated using the CKD EPI equation. This calculation has not been validated in all clinical situations. eGFR's persistently <60 mL/min signify possible Chronic Kidney Disease.    Anion gap 7 5 - 15  Protime-INR     Status: None   Collection Time: 03/23/16  8:41 AM  Result Value Ref Range   Prothrombin Time 12.8 11.4 - 15.2 seconds   INR 0.96   Urinalysis, Routine w reflex microscopic     Status: Abnormal   Collection Time: 03/23/16  8:41 AM  Result Value Ref Range   Color, Urine YELLOW YELLOW   APPearance CLEAR CLEAR   Specific Gravity, Urine 1.012 1.005 - 1.030   pH 6.0 5.0 - 8.0   Glucose, UA NEGATIVE NEGATIVE mg/dL   Hgb urine dipstick NEGATIVE NEGATIVE   Bilirubin Urine NEGATIVE NEGATIVE   Ketones, ur NEGATIVE NEGATIVE mg/dL   Protein, ur NEGATIVE NEGATIVE mg/dL   Nitrite NEGATIVE NEGATIVE   Leukocytes, UA LARGE (A) NEGATIVE   RBC / HPF 0-5 0 -  5 RBC/hpf   WBC, UA 6-30 0 - 5 WBC/hpf   Bacteria, UA MANY (A) NONE SEEN   Squamous Epithelial / LPF 0-5 (A) NONE SEEN   Mucous PRESENT    Hyaline Casts, UA PRESENT   Surgical pcr screen     Status: Abnormal   Collection Time: 03/23/16  8:42 AM  Result Value Ref Range   MRSA, PCR POSITIVE (A) NEGATIVE    Comment: RESULT CALLED TO, READ BACK BY AND VERIFIED WITH: Tawni Millers CMA 11:35 03/23/16 ([email or web address])    Staphylococcus aureus POSITIVE (A) NEGATIVE    Comment:        The Xpert SA Assay (FDA approved for NASAL specimens in patients over 36 years of age), is one component of a comprehensive surveillance program.  Test  performance has been validated by [facility] for patients greater than or equal to 76 year old. It is not intended to diagnose infection nor to guide or monitor treatment.   Type and screen Order type and screen if day of surgery is less than 15 days from draw of preadmission visit or order morning of surgery if day of surgery is greater than 6 days from preadmission visit.     Status: None   Collection Time: 03/23/16  8:55 AM  Result Value Ref Range   ABO/RH(D) O POS    Antibody Screen NEG    Sample Expiration 04/06/2016    Extend sample reason NO TRANSFUSIONS OR PREGNANCY IN THE PAST 3 MONTHS    Estimated body mass index is 33.86 kg/m as calculated from the following:   Height as of 03/23/16: _0  (1.778 m).   Weight as of this encounter: 107 kg (236 lb).   Imaging Review Plain radiographs demonstrate severe degenerative joint disease of the right hip(s). The bone quality appears to be fair for age and reported activity level.  Assessment/Plan:  End stage arthritis, right hip(s)  The patient history, physical examination, clinical judgement of the provider and imaging studies are consistent with end stage degenerative joint disease of the right hip(s) and total hip arthroplasty is deemed medically necessary. The treatment options including medical management, injection therapy, arthroscopy and arthroplasty were discussed at length. The risks and benefits of total hip arthroplasty were presented and reviewed. The risks due to aseptic loosening, infection, stiffness, dislocation/subluxation,  thromboembolic complications and other imponderables were discussed.  The patient acknowledged the explanation, agreed to proceed with the plan and consent was signed. Patient is being admitted for inpatient treatment for surgery, pain control, PT, OT, prophylactic antibiotics, VTE prophylaxis, progressive ambulation and ADL's and discharge planning.The patient is planning to be discharged to  skilled nursing facility

## 2016-03-27 NOTE — Brief Op Note (Signed)
03/27/2016  10:13 AM  PATIENT:  Lauren Huffman  60 y.o. female  PRE-OPERATIVE DIAGNOSIS:  OSTEOARTHRITIS RIGHT HIP  POST-OPERATIVE DIAGNOSIS:  OSTEOARTHRITIS RIGHT HIP  PROCEDURE:  Procedure(s): TOTAL HIP ARTHROPLASTY ANTERIOR APPROACH (Right)  SURGEON:  Surgeon(s) and Role:    * Jodi GeraldsJohn Caylor Cerino, MD - Primary  PHYSICIAN ASSISTANT:   ASSISTANTS: bethune   ANESTHESIA:   spinal  EBL:  Total I/O In: 2000 [I.V.:2000] Out: 600 [Blood:600]  BLOOD ADMINISTERED:none  DRAINS: none   LOCAL MEDICATIONS USED:  MARCAINE    and OTHER experel  SPECIMEN:  No Specimen  DISPOSITION OF SPECIMEN:  N/A  COUNTS:  YES  TOURNIQUET:  * No tourniquets in log *  DICTATION: .Other Dictation: Dictation Number [id number]  PLAN OF CARE: Admit to inpatient   PATIENT DISPOSITION:  PACU - hemodynamically stable.   Delay start of Pharmacological VTE agent (>24hrs) due to surgical blood loss or risk of bleeding: No

## 2016-03-27 NOTE — Evaluation (Signed)
Physical Therapy Evaluation Patient Details Name: Lauren FalconJan M Colter MRN: [id number] DOB: 1955-04-15 Today's Date: 03/27/2016   History of Present Illness  Tamieka M Lias, 60 y.o. female, has a history of pain and functional disability in the right hip(s) due to arthritis and patient has failed non-surgical conservative treatments for greater than 12 weeks to include NSAID's and/or analgesics, weight reduction as appropriate and activity modification.  Onset of symptoms was gradual starting 5 years ago with gradually worsening course since that time.The patient noted no past surgery on the right hip(s).  Pt had direct anterior approach right THA.    Clinical Impression  Pt admitted with above diagnosis. Pt currently with functional limitations due to the deficits listed below (see PT Problem List). Pt was able to stand and pivot to recliner.  Incr pain limited pts ability to walk.  Pt appropriate for SNF at d/c as she will not have 24 hour care as family works.  Pt agrees.  Will follow acutely.  Pt will benefit from skilled PT to increase their independence and safety with mobility to allow discharge to the venue listed below.      Follow Up Recommendations SNF;Supervision/Assistance - 24 hour    Equipment Recommendations  None recommended by PT    Recommendations for Other Services       Precautions / Restrictions Precautions Precautions: None;Fall Restrictions Weight Bearing Restrictions: Yes RLE Weight Bearing: Weight bearing as tolerated      Mobility  Bed Mobility Overal bed mobility: Needs Assistance Bed Mobility: Supine to Sit     Supine to sit: Min assist     General bed mobility comments: Needed some assist to move right LE to EOB as well as for elevation of trunk.   Transfers Overall transfer level: Needs assistance Equipment used: Rolling walker (2 wheeled) Transfers: Sit to/from UGI CorporationStand;Stand Pivot Transfers Sit to Stand: Min assist;From elevated surface Stand pivot  transfers: Min assist       General transfer comment: Raised bed to assist pt to not have to stand from low surface.  Cues for hand placement as well as assist pt to power up and steady once up.   Ambulation/Gait             General Gait Details: Unable to progress ambulation today.   Stairs            Wheelchair Mobility    Modified Rankin (Stroke Patients Only)       Balance Overall balance assessment: Needs assistance;History of Falls Sitting-balance support: Single extremity supported;No upper extremity supported Sitting balance-Leahy Scale: Fair     Standing balance support: Bilateral upper extremity supported;During functional activity Standing balance-Leahy Scale: Poor Standing balance comment: Pt was able to stand with RW but needed UE support bilaterally for stability.                              Pertinent Vitals/Pain Pain Assessment: 0-10 Pain Score: 3  Pain Location: right hip Pain Descriptors / Indicators: Aching;Grimacing;Guarding Pain Intervention(s): Limited activity within patient's tolerance;Premedicated before session;Monitored during session;Repositioned  VSS    Home Living Family/patient expects to be discharged to:: Other (Comment) (rehab) Living Arrangements: Spouse/significant other;Children Available Help at Discharge: Available PRN/intermittently;Family Type of Home: House Home Access: Stairs to enter Entrance Stairs-Rails: None Entrance Stairs-Number of Steps: 2 Home Layout: Two level;Able to live on main level with bedroom/bathroom (basement - doesn't use) Home Equipment: Cane - single point;Bedside commode;Walker -  2 wheels;Shower seat;Adaptive equipment      Prior Function Level of Independence: Independent with assistive device(s)               Hand Dominance   Dominant Hand: Right    Extremity/Trunk Assessment   Upper Extremity Assessment Upper Extremity Assessment: Defer to OT evaluation     Lower Extremity Assessment Lower Extremity Assessment: RLE deficits/detail RLE: Unable to fully assess due to pain RLE Sensation: decreased light touch    Cervical / Trunk Assessment Cervical / Trunk Assessment: Normal  Communication   Communication: No difficulties  Cognition Arousal/Alertness: Awake/alert Behavior During Therapy: WFL for tasks assessed/performed Overall Cognitive Status: Within Functional Limits for tasks assessed                      General Comments      Exercises Total Joint Exercises Ankle Circles/Pumps: AROM;Both;10 reps;Supine Quad Sets: AROM;Both;10 reps;Supine Heel Slides: AAROM;Both;5 reps;Supine Long Arc Quad: AROM;Both;10 reps;Seated   Assessment/Plan    PT Assessment Patient needs continued PT services  PT Problem List Decreased activity tolerance;Decreased balance;Decreased mobility;Decreased knowledge of use of DME;Decreased safety awareness;Decreased knowledge of precautions;Decreased strength;Pain          PT Treatment Interventions Stair training;Gait training;DME instruction;Therapeutic exercise;Therapeutic activities;Functional mobility training;Balance training;Patient/family education    PT Goals (Current goals can be found in the Care Plan section)  Acute Rehab PT Goals Patient Stated Goal: to go home PT Goal Formulation: With patient Time For Goal Achievement: 04/03/16 Potential to Achieve Goals: Good    Frequency 7X/week   Barriers to discharge Decreased caregiver support family works    Co-evaluation               End of Session Equipment Utilized During Treatment: Gait belt Activity Tolerance: Patient limited by fatigue;Patient limited by pain Patient left: in chair;with call bell/phone within reach Nurse Communication: Mobility status         Time: [id number] PT Time Calculation (min) (ACUTE ONLY): 26 min   Charges:   PT Evaluation $PT Eval Moderate Complexity: 1 Procedure PT  Treatments $Therapeutic Activity: 8-22 mins   PT G Codes:        Berline LopesDawn F Gergory Biello 03/27/2016, 4:06 PM Kerry-Anne Mezo,PT Acute Rehabilitation [phone] (pager)

## 2016-03-27 NOTE — Transfer of Care (Signed)
Immediate Anesthesia Transfer of Care Note  Patient: Lauren Huffman  Procedure(s) Performed: Procedure(s): TOTAL HIP ARTHROPLASTY ANTERIOR APPROACH (Right)  Patient Location: PACU  Anesthesia Type:Spinal  Level of Consciousness: awake, alert , oriented and patient cooperative  Airway & Oxygen Therapy: Patient Spontanous Breathing and Patient connected to nasal cannula oxygen  Post-op Assessment: Report given to RN and Post -op Vital signs reviewed and stable  Post vital signs: Reviewed and stable  Last Vitals:  Vitals:   03/27/16 0603  BP: 139/82  Pulse: 94  Resp: 20  Temp: 36.8 C    Last Pain:  Vitals:   03/27/16 0603  TempSrc: Oral         Complications: No apparent anesthesia complications and sensation to calf on the right

## 2016-03-27 NOTE — Anesthesia Procedure Notes (Signed)
Spinal  Start time: 03/27/2016 7:44 AM End time: 03/27/2016 8:46 AM Staffing Anesthesiologist: Lauren LarssonJOSLIN, Lauren Schueller Performed: anesthesiologist  Preanesthetic Checklist Completed: patient identified, site marked, surgical consent, pre-op evaluation, timeout performed, IV checked, risks and benefits discussed and monitors and equipment checked Spinal Block Patient position: sitting Prep: ChloraPrep Patient monitoring: heart rate, cardiac monitor, continuous pulse ox and blood pressure Approach: right paramedian Location: L3-4 Injection technique: single-shot Needle Needle type: Tuohy  Needle gauge: 22 G Needle length: 9 cm Assessment Sensory level: T6 Additional Notes 12 cc 0.75% Bupivacaine injected easily

## 2016-03-27 NOTE — Clinical Social Work Note (Signed)
Pt has surgery today. CSW went to pt room to do initial assessment. Pt asleep at this time, unable to assess. [facility] and her room is ready when medically ready for d/c.   [address]B South StreetBridget Mayton, ConnecticutLCSWA [phone]

## 2016-03-27 NOTE — Anesthesia Preprocedure Evaluation (Addendum)
Anesthesia Evaluation  Patient identified by MRN, date of birth, ID band Patient awake    Reviewed: Allergy & Precautions, NPO status , Patient's Chart, lab work & pertinent test results  Airway Mallampati: II  TM Distance: >3 FB Neck ROM: Full    Dental  (+) Teeth Intact, Dental Advisory Given   Pulmonary  No CPAP   breath sounds clear to auscultation       Cardiovascular hypertension, Pt. on medications  Rhythm:Regular Rate:Normal     Neuro/Psych    GI/Hepatic   Endo/Other    Renal/GU      Musculoskeletal   Abdominal   Peds  Hematology   Anesthesia Other Findings   Reproductive/Obstetrics                            Anesthesia Physical Anesthesia Plan  ASA: III  Anesthesia Plan: MAC and Spinal   Post-op Pain Management:    Induction: Intravenous  Airway Management Planned: Natural Airway and Simple Face Mask  Additional Equipment:   Intra-op Plan:   Post-operative Plan:   Informed Consent: I have reviewed the patients History and Physical, chart, labs and discussed the procedure including the risks, benefits and alternatives for the proposed anesthesia with the patient or authorized representative who has indicated his/her understanding and acceptance.   Dental advisory given  Plan Discussed with: CRNA and Anesthesiologist  Anesthesia Plan Comments:         Anesthesia Quick Evaluation

## 2016-03-27 NOTE — Op Note (Signed)
NAMAlberteen Huffman:  Huffman, Lauren                    ACCOUNT NO.:  [id number]  MEDICAL RECORD NO.:  [id number]  LOCATION:  PERIO                        FACILITY:  [facility]  PHYSICIAN:  Lauren Huffman, M.D.   DATE OF BIRTH:  [birth year]  DATE OF PROCEDURE:  03/27/2016 DATE OF DISCHARGE:                              OPERATIVE REPORT   PREOPERATIVE DIAGNOSIS:  End-stage degenerative joint disease, right hip with severe bone-on-bone change.  POSTOPERATIVE DIAGNOSIS:  End-stage degenerative joint disease, right hip with severe bone-on-bone change.  PROCEDURE: 1. Right total hip replacement with a Corail stem size 11, a 50-mm     Gription hole less cup, a +4 neutral liner, and a +9 delta ceramic     hip ball. 2. Interpretation of multiple intraoperative fluoroscopic images.  SURGEON:  Lauren Huffman, M.D.  ASSISTANT:  Lauren Huffman, P.A.  ANESTHESIA:  General.  BRIEF HISTORY:  [name] is a 60 year old female with longstanding complaints of right hip pain.  She had been treated conservatively for prolonged period of time.  She was having significant popping and catching in her hip prior to surgery.  After failure of all conservative care, she was taken to the operating room for right total hip replacement.  DESCRIPTION OF PROCEDURE:  The patient was taken to the operating room. After adequate anesthesia was obtained with general anesthetic, the patient was placed supine on the operating table.  The right hip was then prepped and draped in usual sterile fashion after she was placed on the Hana bed.  Attention at this time was turned to the incision made over the anterior portion of the hip and subcutaneous tissue down to the level of the extensor fascia.  Extensor fascia was identified and divided in line.  The fascia was released.  Retractors put above and below the femoral neck and the capsule was then opened and tagged. Retractors were put in place.  Provisional neck cut was made and head ball  removed.  Retractors put in place around the acetabulum and the labrum was excised circumferentially.  There was dramatic and hypertrophic labrum of the inferior aspect which was removed.  At this time, the acetabulum was sequentially reamed to a level of 49 mm.  A 50- mm Gription cup was hammered into place with 45 degrees of lateral opening and 30 degrees of anteversion.  A +4 neutral liner was placed at this point.  Attention was then turned towards the stem side where the retractors were put in place behind the femoral neck and the hip put and then externally rotated down and over position.  Once this was done, attention was turned towards sequentially while a cookie cutter was used to open the canal it was then sequentially rasped up to a level of 10. A trial reduction was undertaken with a +0, checked it with the other side which was arthritic.  I wanted to actually make a little long compared to the opposite side.  At this point, it was clear that we are going to definitely get some length.  The hip was re-dislocated.  We then took her to a size 11 stem, put the 11 stem and  set a few millimeters proud which we were happy about and then put a +9 ball on that to really get some length as we knew we could symmetrisize her as we did the other side.  At this point, she turned out to be about 4 mm longer than the other side which is what we were shooting for. Excellent range of motion and stability was achieved.  At this point, we irrigated the hip, suctioned it dry, closed the capsule with 1 Vicryl running, and closed the tensor fascia with 0 Vicryl running.  Exparel was placed on the components and also around the tissues for postoperative pain control.  At this point, once the wound was closed with 2-0 Vicryl and skin staples, sterile compressive dressing was applied.  The patient was taken to the recovery room where she was noted to be in satisfactory condition.  Of note, fluoroscopic  guidance was used throughout the case to ensure leg length and proper fit and fill.     Lauren JuniorJohn L. Jeannetta Huffman, M.D.     Ranae PlumberJLG/MEDQ  D:  03/27/2016  T:  03/27/2016  Job:  [id number]

## 2016-03-27 NOTE — Discharge Instructions (Signed)

## 2016-03-27 NOTE — Anesthesia Postprocedure Evaluation (Signed)
Anesthesia Post Note  Patient: Lauren Huffman  Procedure(s) Performed: Procedure(s) (LRB): TOTAL HIP ARTHROPLASTY ANTERIOR APPROACH (Right)  Patient location during evaluation: PACU Anesthesia Type: MAC and Spinal Level of consciousness: awake, awake and alert and oriented Pain management: pain level controlled Vital Signs Assessment: post-procedure vital signs reviewed and stable Respiratory status: spontaneous breathing, nonlabored ventilation and respiratory function stable Cardiovascular status: blood pressure returned to baseline Anesthetic complications: no       Last Vitals:  Vitals:   03/27/16 1145 03/27/16 1210  BP: 111/60 111/66  Pulse: 87 82  Resp: 13 17  Temp:  36.6 C    Last Pain:  Vitals:   03/27/16 1210  TempSrc:   PainSc: 0-No pain                 Markian Glockner COKER

## 2016-03-28 LAB — BASIC METABOLIC PANEL
Anion gap: 6 (ref 5–15)
BUN: 14 mg/dL (ref 6–20)
CHLORIDE: 105 mmol/L (ref 101–111)
CO2: 25 mmol/L (ref 22–32)
Calcium: 8.4 mg/dL — ABNORMAL LOW (ref 8.9–10.3)
Creatinine, Ser: 0.57 mg/dL (ref 0.44–1.00)
GFR calc Af Amer: >60 mL/min (ref >60)
GFR calc non Af Amer: >60 mL/min (ref >60)
GLUCOSE: 189 mg/dL — AB (ref 65–99)
POTASSIUM: 4.5 mmol/L (ref 3.5–5.1)
Sodium: 136 mmol/L (ref 135–145)

## 2016-03-28 LAB — CBC
HCT: 33.3 % — ABNORMAL LOW (ref 36.0–46.0)
HEMOGLOBIN: 10.8 g/dL — AB (ref 12.0–15.0)
MCH: 29.1 pg (ref 26.0–34.0)
MCHC: 32.4 g/dL (ref 30.0–36.0)
MCV: 89.8 fL (ref 78.0–100.0)
Platelets: 216 10*3/uL (ref 150–400)
RBC: 3.71 MIL/uL — AB (ref 3.87–5.11)
RDW: 13.8 % (ref 11.5–15.5)
WBC: 7.4 10*3/uL (ref 4.0–10.5)

## 2016-03-28 MED ORDER — OXYCODONE-ACETAMINOPHEN 5-325 MG PO TABS: 1.0000 | ORAL_TABLET | Freq: Four times a day (QID) | ORAL | 0 refills | Status: DC | PRN

## 2016-03-28 NOTE — Clinical Social Work Note (Addendum)
Medical Social Worker facilitated patient discharge including contacting patient family and facility to confirm patient discharge plans.  Clinical information faxed to facility and family agreeable with plan. [facility] to arrive at hospital at 1230pm.  RN to call report prior to discharge.  Medical Social Worker will sign off for now as social work intervention is no longer needed. Please consult us again if new need arises.  Derenda FennelBashira Runette Scifres, MSW [phone] 03/28/2016 10:59 AM

## 2016-03-28 NOTE — Progress Notes (Signed)
Physical Therapy Treatment Patient Details Name: Lauren Lauren Huffman M Lauren Huffman MRN: [id number] DOB: 02-Oct-1955 Today's Date: 03/28/2016    History of Present Illness Lauren Lauren Huffman, 60 y.o. female, has a history of pain and functional disability in the right hip(s) due to arthritis and patient has failed non-surgical conservative treatments for greater than 12 weeks to include NSAID's and/or analgesics, weight reduction as appropriate and activity modification.  Onset of symptoms was gradual starting 5 years ago with gradually worsening course since that time.The patient noted no past surgery on the right hip(s).  Pt had direct anterior approach right THA.      PT Comments    Pt admitted with above diagnosis. Pt currently with functional limitations due to balance and endurance deficits. Pt was able to ambulate with RW with min to min guard assist with cues for sequencing.  Has some pain with ambulation and fatigue.  Pt also completed exercise program x 5-10 reps each exercises with weakness in hip muscles.  Continue PT as pt is able.   Pt will benefit from skilled PT to increase their independence and safety with mobility to allow discharge to the venue listed below.    Follow Up Recommendations  SNF;Supervision/Assistance - 24 hour     Equipment Recommendations  None recommended by PT    Recommendations for Other Services       Precautions / Restrictions Precautions Precautions: None;Fall Restrictions Weight Bearing Restrictions: No RLE Weight Bearing: Weight bearing as tolerated    Mobility  Bed Mobility               General bed mobility comments: In chair on arrival.  Transfers Overall transfer level: Needs assistance Equipment used: Rolling walker (2 wheeled) Transfers: Sit to/from Stand Sit to Stand: Min guard         General transfer comment: Pt was able to get out of recliner with cues for hand and foot placement and steadying assist once up.    Ambulation/Gait Ambulation/Gait  assistance: Min guard;Min assist Ambulation Distance (Feet): 150 Feet Assistive device: Rolling walker (2 wheeled) Gait Pattern/deviations: Step-to pattern;Decreased step length - right;Decreased dorsiflexion - right;Decreased weight shift to right;Decreased stance time - right;Antalgic Gait velocity: Pt able to ambulate with occasional cues for sequencing steps and RW.  Cues to stay close to RW as well.  Pt fatigued considerably at end of walk.  Gait velocity interpretation: Below normal speed for age/gender General Gait Details: Unable to progress ambulation today.    Stairs            Wheelchair Mobility    Modified Rankin (Stroke Patients Only)       Balance           Standing balance support: Bilateral upper extremity supported;During functional activity Standing balance-Leahy Scale: Poor Standing balance comment: Pt was able to stand with RW but needed UE support bilaterally for stability.              High level balance activites: Backward walking;Direction changes;Turns;Sudden stops High Level Balance Comments: min assist with RW for higher level balance activities    Cognition Arousal/Alertness: Awake/alert Behavior During Therapy: WFL for tasks assessed/performed Overall Cognitive Status: Within Functional Limits for tasks assessed                      Exercises Total Joint Exercises Ankle Circles/Pumps: AROM;Both;10 reps;Supine Quad Sets: AROM;Both;10 reps;Supine Gluteal Sets: AROM;Both;10 reps;Supine Short Arc Quad: AROM;10 reps;Right;Supine Heel Slides: AAROM;Supine;10 reps;Right;Standing Hip ABduction/ADduction: AROM;Right;10  reps;Standing Long Arc Quad: AROM;Both;10 reps;Seated Marching in Standing: AROM;Right;10 reps;Standing Standing Hip Extension: AROM;Right;10 reps;Standing    General Comments        Pertinent Vitals/Pain Pain Assessment: Faces Faces Pain Scale: Hurts little more Pain Location: right hip Pain Descriptors /  Indicators: Aching;Grimacing;Guarding Pain Intervention(s): Limited activity within patient's tolerance;Monitored during session;Premedicated before session;Repositioned;Ice applied  VSS    Home Living                      Prior Function            PT Goals (current goals can now be found in the care plan section) Acute Rehab PT Goals Patient Stated Goal: to go home Progress towards PT goals: Progressing toward goals    Frequency    7X/week      PT Plan Current plan remains appropriate    Co-evaluation             End of Session Equipment Utilized During Treatment: Gait belt Activity Tolerance: Patient limited by fatigue;Patient limited by pain Patient left: in chair;with call bell/phone within reach     Time: 0951-1019 PT Time Calculation (min) (ACUTE ONLY): 28 min  Charges:  $Gait Training: 8-22 mins $Therapeutic Exercise: 8-22 mins                    G Codes:      Berline LopesDawn F Rashunda Passon 03/28/2016, 11:24 AM Eber Jonesawn Tifanny Dollens,PT Acute Rehabilitation [phone] (pager)

## 2016-03-28 NOTE — Evaluation (Signed)
Occupational Therapy Evaluation Patient Details Name: CAMBREE HENDRIX MRN: [id number] DOB: 11-12-1955 Today's Date: 03/28/2016    History of Present Illness Sharlene M Genis, 60 y.o. female, has a history of pain and functional disability in the right hip(s) due to arthritis and patient has failed non-surgical conservative treatments for greater than 12 weeks to include NSAID's and/or analgesics, weight reduction as appropriate and activity modification.  Onset of symptoms was gradual starting 5 years ago with gradually worsening course since that time.The patient noted no past surgery on the right hip(s).  Pt had direct anterior approach right THA.     Clinical Impression   PTA, pt was independent with ADL and functional mobility. Pt with limited L shoulder AROM at baseline but is functional with ADL. See UE assessment for details. Pt currently requires min guard assist for toilet transfers and mod assist with LB ADL. Pt would benefit from short-term SNF placement post-acute D/C for continued rehabilitation services as she will not have 24 hour assistance at home. All further OT needs can be met in next venue of care. Acute OT will sign off.    Follow Up Recommendations  SNF;Supervision/Assistance - 24 hour    Equipment Recommendations  Other (comment) (TBD at next venue of care.)    Recommendations for Other Services       Precautions / Restrictions Precautions Precautions: None;Fall Restrictions Weight Bearing Restrictions: Yes RLE Weight Bearing: Weight bearing as tolerated      Mobility Bed Mobility               General bed mobility comments: In chair on arrival.  Transfers Overall transfer level: Needs assistance Equipment used: Rolling walker (2 wheeled) Transfers: Sit to/from Stand Sit to Stand: Min guard         General transfer comment: Pt was able to get out of recliner with cues for hand and foot placement and steadying assist once up.      Balance Overall  balance assessment: Needs assistance;History of Falls Sitting-balance support: Single extremity supported;No upper extremity supported Sitting balance-Leahy Scale: Good     Standing balance support: Bilateral upper extremity supported;During functional activity;No upper extremity supported Standing balance-Leahy Scale: Fair Standing balance comment: Able to stand statically for grooming at sink  but requires BUE support for dynamic standing.             High level balance activites: Backward walking;Direction changes;Turns;Sudden stops High Level Balance Comments: min assist with RW for higher level balance activities            ADL Overall ADL's : Needs assistance/impaired     Grooming: Supervision/safety;Oral care   Upper Body Bathing: Set up;Sitting   Lower Body Bathing: Moderate assistance;Sit to/from stand   Upper Body Dressing : Set up;Sitting   Lower Body Dressing: Moderate assistance;Sit to/from stand   Toilet Transfer: Min guard;Ambulation;RW;BSC   Toileting- Water quality scientist and Hygiene: Min guard;Sit to/from stand       Functional mobility during ADLs: Min guard;Rolling walker General ADL Comments: Educated pt on safety with ADL post-operatively.     Vision Vision Assessment?: No apparent visual deficits   Perception     Praxis      Pertinent Vitals/Pain Pain Assessment: 0-10 Pain Score: 5  Faces Pain Scale: Hurts little more Pain Location: right hip Pain Descriptors / Indicators: Aching;Guarding;Sore Pain Intervention(s): Limited activity within patient's tolerance;Monitored during session;Repositioned;Ice applied     Hand Dominance Right   Extremity/Trunk Assessment Upper Extremity Assessment Upper Extremity  Assessment: LUE deficits/detail LUE Deficits / Details: Per pt, awaiting shoulder replacement. Able to achieve approximately 100 degrees abduction,115 degrees forward flexion, 15 degrees external rotation. Pt able to achieve  functional internal rotation to pull up pants but donning bra is difficult.   Lower Extremity Assessment Lower Extremity Assessment: Defer to PT evaluation   Cervical / Trunk Assessment Cervical / Trunk Assessment: Normal   Communication Communication Communication: No difficulties   Cognition Arousal/Alertness: Awake/alert Behavior During Therapy: WFL for tasks assessed/performed Overall Cognitive Status: Within Functional Limits for tasks assessed                     General Comments       Exercises Exercises: Total Joint     Shoulder Instructions      Home Living Family/patient expects to be discharged to:: Skilled nursing facility Living Arrangements: Spouse/significant other;Children (43 y.o. daughter; both work) Available Help at Discharge: Available PRN/intermittently;Family Type of Home: House Home Access: Stairs to enter [employer] of Steps: 2 Entrance Stairs-Rails: None Home Layout: Two level;Able to live on main level with bedroom/bathroom (basement - doesnt use)     Bathroom Shower/Tub: Teacher, early years/pre: Standard Bathroom Accessibility: Yes   Home Equipment: Cane - single point;Bedside commode;Walker - 2 wheels;Shower seat;Adaptive equipment Adaptive Equipment: Reacher        Prior Functioning/Environment Level of Independence: Independent with assistive device(s)                 OT Problem List: Decreased strength;Decreased range of motion;Decreased activity tolerance;Impaired balance (sitting and/or standing);Decreased safety awareness;Decreased knowledge of use of DME or AE;Decreased knowledge of precautions;Pain   OT Treatment/Interventions:      OT Goals(Current goals can be found in the care plan section) Acute Rehab OT Goals Patient Stated Goal: to go to [facility] for more rehab OT Goal Formulation: With patient Time For Goal Achievement: 04/04/16 Potential to Achieve Goals: Good  OT Frequency:      Barriers to D/C:            Co-evaluation              End of Session Equipment Utilized During Treatment: Gait belt;Rolling walker  Activity Tolerance: Patient tolerated treatment well Patient left: in chair;with call bell/phone within reach   Time: 1134-1156 OT Time Calculation (min): 22 min Charges:  OT General Charges $OT Visit: 1 Procedure OT Evaluation $OT Eval Moderate Complexity: 1 Procedure  Norman Herrlich, OTR/L [phone] 03/28/2016, 12:53 PM

## 2016-03-28 NOTE — Progress Notes (Signed)
Subjective: 1 Day Post-Op Procedure(s) (LRB): TOTAL HIP ARTHROPLASTY ANTERIOR APPROACH (Right)   Patient doing great. She states she has been up and walking in the hall this morning and that [facility]. She would like to go today.  Activity level:  wbat Diet tolerance:  ok Voiding:  ok Patient reports pain as mild.    Objective: Vital signs in last 24 hours: Temp:  [97.9 F (36.6 C)-98.9 F (37.2 C)] 98.3 F (36.8 C) (12/23 0546) Pulse Rate:  [78-94] 94 (12/23 0546) Resp:  [11-17] 17 (12/23 0546) BP: (89-126)/(57-73) 125/73 (12/23 0546) SpO2:  [97 %-100 %] 97 % (12/23 0546)  Labs:  Recent Labs  03/28/16 0626  HGB 10.8*    Recent Labs  03/28/16 0626  WBC 7.4  RBC 3.71*  HCT 33.3*  PLT 216    Recent Labs  03/28/16 0626  NA 136  K 4.5  CL 105  CO2 25  BUN 14  CREATININE 0.57  GLUCOSE 189*  CALCIUM 8.4*   No results for input(s): LABPT, INR in the last 72 hours.  Physical Exam:  Neurologically intact ABD soft Neurovascular intact Sensation intact distally Intact pulses distally Dorsiflexion/Plantar flexion intact Incision: dressing C/D/I and no drainage No cellulitis present Compartment soft  Assessment/Plan:  1 Day Post-Op Procedure(s) (LRB): TOTAL HIP ARTHROPLASTY ANTERIOR APPROACH (Right) Advance diet Up with therapy Discharge to SNF today. Follow up with Dr. Luiz BlareGraves in office 2 weeks post op. Continue on ASA 325mg  BID x 4 weeks post op. Continue on current pain meds.  Silvestre Mines, Ginger OrganNDREW PAUL 03/28/2016, 10:30 AM

## 2016-03-28 NOTE — Discharge Summary (Signed)
Patient ID: Lauren FalconJan M Huffman MRN: [id number] DOB/AGE: Nov 05, 1955 60 y.o.  Admit date: 03/27/2016 Discharge date: 03/28/2016  Admission Diagnoses:  Principal Problem:   Primary osteoarthritis of right hip   Discharge Diagnoses:  Same  Past Medical History:  Diagnosis Date  . Anxiety   . Arthritis   . Carpal tunnel syndrome    Right  . Depression   . Headache(784.0)   . Hiatal hernia   . Hyperlipemia   . Hypertension   . Left arm pain   . Mental disorder   . Osteoarthritis   . Sleep apnea    DOES NOT WEAR CPAP    . Status post ablation of incompetent vein using laser 2x in Nov. 2017   right  . TMJ (dislocation of temporomandibular joint)   . Varicose veins     Surgeries: Procedure(s):right TOTAL HIP ARTHROPLASTY ANTERIOR APPROACH on 03/27/2016   Consultants:   Discharged Condition: Improved  Hospital Course: Lauren FalconJan M Huffman is an 60 y.o. female who was admitted 03/27/2016 for operative treatment ofPrimary osteoarthritis of right hip. Patient has severe unremitting pain that affects sleep, daily activities, and work/hobbies. After pre-op clearance the patient was taken to the operating room on 03/27/2016 and underwent  Procedure(s):Right TOTAL HIP ARTHROPLASTY ANTERIOR APPROACH.    Patient was given perioperative antibiotics:  Anti-infectives    Start     Dose/Rate Route Frequency Ordered Stop   03/27/16 1600  vancomycin (VANCOCIN) IVPB 1000 mg/200 mL premix     1,000 mg 200 mL/hr over 60 Minutes Intravenous Every 12 hours 03/27/16 1055 03/28/16 0608   03/27/16 0729  vancomycin (VANCOCIN) 1-5 GM/200ML-% IVPB    Comments:  Darcey NoraJames, Karen   : cabinet override      03/27/16 0729 03/27/16 1944   03/27/16 0612  ceFAZolin (ANCEF) IVPB 2g/100 mL premix     2 g 200 mL/hr over 30 Minutes Intravenous On call to O.R. 03/27/16 0612 03/27/16 0830   03/27/16 0000  doxycycline (VIBRAMYCIN) 100 MG capsule     100 mg Oral 2 times daily 03/27/16 1044         Patient was given  sequential compression devices, early ambulation, and chemoprophylaxis to prevent DVT.  Patient benefited maximally from hospital stay and there were no complications.    Recent vital signs:  Patient Vitals for the past 24 hrs:  BP Temp Temp src Pulse Resp SpO2  03/28/16 0546 125/73 98.3 F (36.8 C) Oral 94 17 97 %  03/28/16 0020 124/72 98.5 F (36.9 C) Oral 87 17 98 %  03/27/16 2011 126/69 98.9 F (37.2 C) Oral 93 17 98 %     Recent laboratory studies:   Recent Labs  03/28/16 0626  WBC 7.4  HGB 10.8*  HCT 33.3*  PLT 216  NA 136  K 4.5  CL 105  CO2 25  BUN 14  CREATININE 0.57  GLUCOSE 189*  CALCIUM 8.4*     Discharge Medications:   Allergies as of 03/28/2016      Reactions   Gabapentin Other (See Comments)   Weakness and muscle contractions   Metoprolol Other (See Comments)   INCREASED DEPRESSION    Sulfa Antibiotics    Family History      Medication List    STOP taking these medications   celecoxib 100 MG capsule Commonly known as:  CELEBREX   diclofenac 75 MG EC tablet Commonly known as:  VOLTAREN   meloxicam 15 MG tablet Commonly known as:  MOBIC  TAKE these medications   acetaminophen 500 MG tablet Commonly known as:  TYLENOL Take 1,000 mg by mouth every 6 (six) hours as needed for mild pain.   ALIGN PO Take 1 tablet by mouth daily.   aspirin EC 325 MG tablet Take 1 tablet (325 mg total) by mouth 2 (two) times daily after a meal. Take x 1 month post op to decrease risk of blood clots.   B-12 1000 MCG Subl Place 1,000 mcg under the tongue 2 (two) times daily as needed (ENERGY).   buPROPion 150 MG 24 hr tablet Commonly known as:  WELLBUTRIN XL Take 450 mg by mouth daily.   citalopram 40 MG tablet Commonly known as:  CELEXA Take 40 mg by mouth every morning.   docusate sodium 100 MG capsule Commonly known as:  COLACE Take 1 capsule (100 mg total) by mouth 2 (two) times daily.   doxycycline 100 MG capsule Commonly known as:   VIBRAMYCIN Take 1 capsule (100 mg total) by mouth 2 (two) times daily.   ferrous sulfate 325 (65 FE) MG tablet Take 325 mg by mouth daily with breakfast.   folic acid 1 MG tablet Commonly known as:  FOLVITE Take 2 mg by mouth daily.   GAS-X PO Take 1 tablet by mouth 4 (four) times daily - after meals and at bedtime.   LATUDA 20 MG Tabs tablet Generic drug:  lurasidone Take 20 mg by mouth every evening.   lisinopril 10 MG tablet Commonly known as:  PRINIVIL,ZESTRIL Take 10 mg by mouth daily.   loratadine 10 MG tablet Commonly known as:  CLARITIN Take 10 mg by mouth daily.   methocarbamol 750 MG tablet Commonly known as:  ROBAXIN-750 Take 1 tablet (750 mg total) by mouth every 8 (eight) hours as needed for muscle spasms.   MULTIVITAMIN GUMMIES ADULT PO Take 2 tablets by mouth daily.   nitrofurantoin (macrocrystal-monohydrate) 100 MG capsule Commonly known as:  MACROBID Take 100 mg by mouth 2 (two) times daily.   omeprazole 40 MG capsule Commonly known as:  PRILOSEC TAKE 1 CAPSULE BY MOUTH TWICE DAILY   oxyCODONE-acetaminophen 5-325 MG tablet Commonly known as:  PERCOCET/ROXICET Take 1-2 tablets by mouth every 6 (six) hours as needed for severe pain.   topiramate 50 MG tablet Commonly known as:  TOPAMAX Take 2 tablets (100 mg total) by mouth 2 (two) times daily.   traMADol 50 MG tablet Commonly known as:  ULTRAM Take by mouth 3 (three) times daily as needed.   traMADol 50 MG tablet Commonly known as:  ULTRAM Take 1-2 tablets every 8 hours as needed for pain.   traZODone 50 MG tablet Commonly known as:  DESYREL Take 25-100 mg by mouth at bedtime as needed for sleep. HAS NOT STARTED TAKING YET BUT PLANS TO START   VITAMIN C PO Take 1-2 tablets by mouth daily. TAKES 1 DAILY 2 IF FEELS LIKE GETTING SICK   Vitamin D-3 1000 units Caps Take 1,000 Units by mouth daily.       Diagnostic Studies: Dg Chest 2 View  Result Date: 03/23/2016 CLINICAL DATA:   Preoperative exam prior to right total hip joint replacement. No current chest complaints. History of hypertension. EXAM: CHEST  2 VIEW COMPARISON:  Chest x-ray of December 17, 2011 FINDINGS: The lungs are well-expanded. There is no focal infiltrate. There is no pleural effusion. There small hiatal hernia. The heart and pulmonary vascularity are normal. The mediastinum is normal in width. There is multilevel degenerative  disc disease of the thoracic spine. There is dextrocurvature of the thoracolumbar spine centered at the thoracolumbar junction. IMPRESSION: There is no active cardiopulmonary disease. There is a small hiatal hernia. Electronically Signed   By: David  [place] M.D.   On: 03/23/2016 09:17   Dg C-arm 1-60 Min  Result Date: 03/27/2016 CLINICAL DATA:  Right hip replacement. EXAM: DG C-ARM 61-120 MIN; OPERATIVE RIGHT HIP WITH PELVIS COMPARISON:  None. FINDINGS: Total right hip arthroplasty is located. No visible periprosthetic fracture. Expected soft tissue gas. IMPRESSION: Fluoroscopy for total right hip arthroplasty. No unexpected finding. Electronically Signed   By: Marnee Spring M.D.   On: 03/27/2016 10:13   Dg Hip Operative Unilat W Or W/o Pelvis Right  Result Date: 03/27/2016 CLINICAL DATA:  Right hip replacement. EXAM: DG C-ARM 61-120 MIN; OPERATIVE RIGHT HIP WITH PELVIS COMPARISON:  None. FINDINGS: Total right hip arthroplasty is located. No visible periprosthetic fracture. Expected soft tissue gas. IMPRESSION: Fluoroscopy for total right hip arthroplasty. No unexpected finding. Electronically Signed   By: Marnee Spring M.D.   On: 03/27/2016 10:13    Disposition: 06-Home-Health Care Svc  Discharge Instructions    Call MD / Call 911    Complete by:  As directed    If you experience chest pain or shortness of breath, CALL 911 and be transported to the hospital emergency room.  If you develope a fever above 101 F, pus (white drainage) or increased drainage or redness at the  wound, or calf pain, call your surgeon's office.   Constipation Prevention    Complete by:  As directed    Drink plenty of fluids.  Prune juice may be helpful.  You may use a stool softener, such as Colace (over the counter) 100 mg twice a day.  Use MiraLax (over the counter) for constipation as needed.   Diet - low sodium heart healthy    Complete by:  As directed    Discharge instructions    Complete by:  As directed    INSTRUCTIONS AFTER JOINT REPLACEMENT   Remove items at home which could result in a fall. This includes throw rugs or furniture in walking pathways ICE to the affected joint every three hours while awake for 30 minutes at a time, for at least the first 3-5 days, and then as needed for pain and swelling.  Continue to use ice for pain and swelling. You may notice swelling that will progress down to the foot and ankle.  This is normal after surgery.  Elevate your leg when you are not up walking on it.   Continue to use the breathing machine you got in the hospital (incentive spirometer) which will help keep your temperature down.  It is common for your temperature to cycle up and down following surgery, especially at night when you are not up moving around and exerting yourself.  The breathing machine keeps your lungs expanded and your temperature down.   DIET:  As you were doing prior to hospitalization, we recommend a well-balanced diet.  DRESSING / WOUND CARE / SHOWERING  You may shower 3 days after surgery, but keep the wounds dry during showering.  You may use an occlusive plastic wrap (Press'n Seal for example), NO SOAKING/SUBMERGING IN THE BATHTUB.  If the bandage gets wet, change with a clean dry gauze.  If the incision gets wet, pat the wound dry with a clean towel.  ACTIVITY  Increase activity slowly as tolerated, but follow the weight bearing instructions below.  No driving for 6 weeks or until further direction given by your physician.  You cannot drive while  taking narcotics.  No lifting or carrying greater than 10 lbs. until further directed by your surgeon. Avoid periods of inactivity such as sitting longer than an hour when not asleep. This helps prevent blood clots.  You may return to work once you are authorized by your doctor.     WEIGHT BEARING   Weight bearing as tolerated with assist device (walker, cane, etc) as directed, use it as long as suggested by your surgeon or therapist, typically at least 4-6 weeks.   EXERCISES  Results after joint replacement surgery are often greatly improved when you follow the exercise, range of motion and muscle strengthening exercises prescribed by your doctor. Safety measures are also important to protect the joint from further injury. Any time any of these exercises cause you to have increased pain or swelling, decrease what you are doing until you are comfortable again and then slowly increase them. If you have problems or questions, call your caregiver or physical therapist for advice.   Rehabilitation is important following a joint replacement. After just a few days of immobilization, the muscles of the leg can become weakened and shrink (atrophy).  These exercises are designed to build up the tone and strength of the thigh and leg muscles and to improve motion. Often times heat used for twenty to thirty minutes before working out will loosen up your tissues and help with improving the range of motion but do not use heat for the first two weeks following surgery (sometimes heat can increase post-operative swelling).   These exercises can be done on a training (exercise) mat, on the floor, on a table or on a bed. Use whatever works the best and is most comfortable for you.    Use music or television while you are exercising so that the exercises are a pleasant break in your day. This will make your life better with the exercises acting as a break in your routine that you can look forward to.   Perform all  exercises about fifteen times, three times per day or as directed.  You should exercise both the operative leg and the other leg as well.   Exercises include:   Quad Sets - Tighten up the muscle on the front of the thigh (Quad) and hold for 5-10 seconds.   Straight Leg Raises - With your knee straight (if you were given a brace, keep it on), lift the leg to 60 degrees, hold for 3 seconds, and slowly lower the leg.  Perform this exercise against resistance later as your leg gets stronger.  Leg Slides: Lying on your back, slowly slide your foot toward your buttocks, bending your knee up off the floor (only go as far as is comfortable). Then slowly slide your foot back down until your leg is flat on the floor again.  Angel Wings: Lying on your back spread your legs to the side as far apart as you can without causing discomfort.  Hamstring Strength:  Lying on your back, push your heel against the floor with your leg straight by tightening up the muscles of your buttocks.  Repeat, but this time bend your knee to a comfortable angle, and push your heel against the floor.  You may put a pillow under the heel to make it more comfortable if necessary.   A rehabilitation program following joint replacement surgery can speed recovery and prevent re-injury  in the future due to weakened muscles. Contact your doctor or a physical therapist for more information on knee rehabilitation.    CONSTIPATION  Constipation is defined medically as fewer than three stools per week and severe constipation as less than one stool per week.  Even if you have a regular bowel pattern at home, your normal regimen is likely to be disrupted due to multiple reasons following surgery.  Combination of anesthesia, postoperative narcotics, change in appetite and fluid intake all can affect your bowels.   YOU MUST use at least one of the following options; they are listed in order of increasing strength to get the job done.  They are all  available over the counter, and you may need to use some, POSSIBLY even all of these options:    Drink plenty of fluids (prune juice may be helpful) and high fiber foods Colace 100 mg by mouth twice a day  Senokot for constipation as directed and as needed Dulcolax (bisacodyl), take with full glass of water  Miralax (polyethylene glycol) once or twice a day as needed.  If you have tried all these things and are unable to have a bowel movement in the first 3-4 days after surgery call either your surgeon or your primary doctor.    If you experience loose stools or diarrhea, hold the medications until you stool forms back up.  If your symptoms do not get better within 1 week or if they get worse, check with your doctor.  If you experience "the worst abdominal pain ever" or develop nausea or vomiting, please contact the office immediately for further recommendations for treatment.   ITCHING:  If you experience itching with your medications, try taking only a single pain pill, or even half a pain pill at a time.  You can also use Benadryl over the counter for itching or also to help with sleep.   TED HOSE STOCKINGS:  Use stockings on both legs until for at least 2 weeks or as directed by physician office. They may be removed at night for sleeping.  MEDICATIONS:  See your medication summary on the "After Visit Summary" that nursing will review with you.  You may have some home medications which will be placed on hold until you complete the course of blood thinner medication.  It is important for you to complete the blood thinner medication as prescribed.  PRECAUTIONS:  If you experience chest pain or shortness of breath - call 911 immediately for transfer to the hospital emergency department.   If you develop a fever greater that 101 F, purulent drainage from wound, increased redness or drainage from wound, foul odor from the wound/dressing, or calf pain - CONTACT YOUR SURGEON.                                                    FOLLOW-UP APPOINTMENTS:  If you do not already have a post-op appointment, please call the office for an appointment to be seen by your surgeon.  Guidelines for how soon to be seen are listed in your "After Visit Summary", but are typically between 1-4 weeks after surgery.  OTHER INSTRUCTIONS:   Knee Replacement:  Do not place pillow under knee, focus on keeping the knee straight while resting. CPM instructions: 0-90 degrees, 2 hours in the morning, 2 hours in the  afternoon, and 2 hours in the evening. Place foam block, curve side up under heel at all times except when in CPM or when walking.  DO NOT modify, tear, cut, or change the foam block in any way.  MAKE SURE YOU:  Understand these instructions.  Get help right away if you are not doing well or get worse.    Thank you for letting us be a part of your medical care team.  It is a privilege we respect greatly.  We hope these instructions will help you stay on track for a fast and full recovery!   Increase activity slowly as tolerated    Complete by:  As directed        Contact information for follow-up providers    GRAVES,JOHN L, MD. Schedule an appointment as soon as possible for a visit in 2 weeks.   Specialty:  Orthopedic Surgery Contact information: [address] [place] [id number] [phone]            Contact information for after-discharge care    Destination    [facility] .   Specialties:  Skilled Holiday [facility] Facility, Assisted Living Chief of Staffacility Contact information: [address] [place] [id number] [phone]                   Signed: Matthew FolksBETHUNE,[place] G 03/28/2016, 2:24 PM

## 2016-03-31 ENCOUNTER — Encounter (HOSPITAL_COMMUNITY): Payer: Self-pay | Admitting: Orthopedic Surgery

## 2016-04-15 ENCOUNTER — Other Ambulatory Visit: Payer: Self-pay | Admitting: Family Medicine

## 2016-04-15 DIAGNOSIS — Z1231 Encounter for screening mammogram for malignant neoplasm of breast: Secondary | ICD-10-CM

## 2016-04-27 ENCOUNTER — Ambulatory Visit: Payer: Managed Care, Other (non HMO)

## 2016-04-28 ENCOUNTER — Ambulatory Visit
Admission: RE | Admit: 2016-04-28 | Discharge: 2016-04-28 | Disposition: A | Discharge status: Home / Self Care | Payer: BC Managed Care – PPO | Source: Ambulatory Visit | Attending: Family Medicine | Admitting: Family Medicine

## 2016-04-28 DIAGNOSIS — Z1231 Encounter for screening mammogram for malignant neoplasm of breast: Secondary | ICD-10-CM

## 2017-01-19 ENCOUNTER — Emergency Department (HOSPITAL_COMMUNITY)
Admission: EM | Admit: 2017-01-19 | Discharge: 2017-01-19 | Disposition: A | Discharge status: Home / Self Care | Payer: BC Managed Care – PPO | Attending: Emergency Medicine | Admitting: Emergency Medicine

## 2017-01-19 ENCOUNTER — Encounter (HOSPITAL_COMMUNITY): Payer: Self-pay

## 2017-01-19 DIAGNOSIS — Z7982 Long term (current) use of aspirin: Secondary | ICD-10-CM | POA: Diagnosis not present

## 2017-01-19 DIAGNOSIS — I471 Supraventricular tachycardia: Secondary | ICD-10-CM | POA: Insufficient documentation

## 2017-01-19 DIAGNOSIS — Z96641 Presence of right artificial hip joint: Secondary | ICD-10-CM | POA: Insufficient documentation

## 2017-01-19 DIAGNOSIS — R002 Palpitations: Secondary | ICD-10-CM | POA: Diagnosis present

## 2017-01-19 DIAGNOSIS — Z96653 Presence of artificial knee joint, bilateral: Secondary | ICD-10-CM | POA: Insufficient documentation

## 2017-01-19 DIAGNOSIS — Z79899 Other long term (current) drug therapy: Secondary | ICD-10-CM | POA: Diagnosis not present

## 2017-01-19 HISTORY — DX: Supraventricular tachycardia, unspecified: I47.10

## 2017-01-19 HISTORY — DX: Supraventricular tachycardia: I47.1

## 2017-01-19 LAB — CBC WITH DIFFERENTIAL/PLATELET
Basophils Absolute: 0 10*3/uL (ref 0.0–0.1)
Basophils Relative: 0 %
EOS PCT: 2 %
Eosinophils Absolute: 0.2 10*3/uL (ref 0.0–0.7)
HCT: 44 % (ref 36.0–46.0)
Hemoglobin: 14.5 g/dL (ref 12.0–15.0)
LYMPHS ABS: 2.9 10*3/uL (ref 0.7–4.0)
LYMPHS PCT: 28 %
MCH: 30.1 pg (ref 26.0–34.0)
MCHC: 33 g/dL (ref 30.0–36.0)
MCV: 91.5 fL (ref 78.0–100.0)
Monocytes Absolute: 0.8 10*3/uL (ref 0.1–1.0)
Monocytes Relative: 7 %
Neutro Abs: 6.5 10*3/uL (ref 1.7–7.7)
Neutrophils Relative %: 63 %
Platelets: 344 10*3/uL (ref 150–400)
RBC: 4.81 MIL/uL (ref 3.87–5.11)
RDW: 13.8 % (ref 11.5–15.5)
WBC: 10.4 10*3/uL (ref 4.0–10.5)

## 2017-01-19 LAB — BASIC METABOLIC PANEL
Anion gap: 10 (ref 5–15)
BUN: 21 mg/dL — AB (ref 6–20)
CHLORIDE: 107 mmol/L (ref 101–111)
CO2: 23 mmol/L (ref 22–32)
Calcium: 9.1 mg/dL (ref 8.9–10.3)
Creatinine, Ser: 1.06 mg/dL — ABNORMAL HIGH (ref 0.44–1.00)
GFR calc Af Amer: >60 mL/min (ref >60)
GFR calc non Af Amer: 55 mL/min — ABNORMAL LOW (ref >60)
Glucose, Bld: 92 mg/dL (ref 65–99)
POTASSIUM: 4.3 mmol/L (ref 3.5–5.1)
Sodium: 140 mmol/L (ref 135–145)

## 2017-01-19 LAB — MAGNESIUM: Magnesium: 2.1 mg/dL (ref 1.7–2.4)

## 2017-01-19 LAB — TSH: TSH: 2.792 u[IU]/mL (ref 0.350–4.500)

## 2017-01-19 MED ORDER — ADENOSINE 6 MG/2ML IV SOLN
INTRAVENOUS | Status: AC
  Administered 2017-01-19: 18 mg
  Filled 2017-01-19: qty 2

## 2017-01-19 MED ORDER — DILTIAZEM HCL ER COATED BEADS 120 MG PO CP24: 120.0000 mg | ORAL_CAPSULE | Freq: Every day | ORAL | 0 refills | Status: DC

## 2017-01-19 MED ORDER — DILTIAZEM HCL ER COATED BEADS 120 MG PO CP24
120.0000 mg | ORAL_CAPSULE | Freq: Once | ORAL | Status: AC
  Administered 2017-01-19: 120 mg via ORAL
  Filled 2017-01-19: qty 1

## 2017-01-19 NOTE — ED Provider Notes (Signed)
[facility] EMERGENCY DEPARTMENT Provider Note   CSN: [id number] Arrival date & time: 01/19/17  0865     History   Chief Complaint No chief complaint on file.   HPI Lauren Huffman is a 61 y.o. female. CC:  Dizziness, rapid heart rate  HPI:  Lauren Huffman is a 61 year old female. 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  She presents after feeling palpitations dizziness lightheadedness and nausea starting this morning. She was due to take a train to [place] to see a friend who has been diagnosed with cancer. She canceled the trip because of her symptoms and presents here with a heart rate of 157  No history of tachyarrhythmias. She had a similar episode 11 days ago. Her current symptoms started yesterday and after a few hours resolved, only to recur this morning. No chest pain or tightness. No presyncope. Lightheadedness and orthostasis and mild nausea  Past Medical History:  Diagnosis Date  . Anxiety   . Arthritis   . Carpal tunnel syndrome    Right  . Depression   . Headache(784.0)   . Hiatal hernia   . Hyperlipemia   . Hypertension   . Left arm pain   . Mental disorder   . Osteoarthritis   . Sleep apnea    DOES NOT WEAR CPAP    . Status post ablation of incompetent vein using laser 2x in Nov. 2017   right  . TMJ (dislocation of temporomandibular joint)   . Varicose veins     Patient Active Problem List   Diagnosis Date Noted  . Primary osteoarthritis of right hip 03/27/2016  . CTS (carpal tunnel syndrome) 09/17/2015  . Left elbow pain 06/20/2015  . Paresthesia 06/20/2015  . Osteoarthritis of left knee 07/08/2012  . Obesity 12/28/2011  . Osteoarthritis of right knee 12/25/2011    Past Surgical History:  Procedure Laterality Date  . CYST REMOVAL HAND Left   . JOINT REPLACEMENT Right    knee  . KNEE ARTHROPLASTY  12/25/2011   Procedure: COMPUTER ASSISTED TOTAL KNEE ARTHROPLASTY;  Surgeon: [name], MD;  Location: [facility] OR;  Service: Orthopedics;   Laterality: Right;  Right total knee replacement, general anesthesia, femoral nerve block  . KNEE ARTHROPLASTY Left 07/08/2012   Procedure: COMPUTER ASSISTED TOTAL KNEE ARTHROPLASTY;  Surgeon: [name], MD;  Location: [facility] OR;  Service: Orthopedics;  Laterality: Left;  PRE OP FEMORAL NERVE BLOCK  . TOTAL HIP ARTHROPLASTY Right 03/27/2016  . TOTAL HIP ARTHROPLASTY Right 03/27/2016   Procedure: TOTAL HIP ARTHROPLASTY ANTERIOR APPROACH;  Surgeon: [name], MD;  Location: [facility] OR;  Service: Orthopedics;  Laterality: Right;  . TOTAL KNEE ARTHROPLASTY Left 07/08/2012   [name]    OB History    No data available       Home Medications    Prior to Admission medications   Medication Sig Start Date End Date Taking? Authorizing Provider  acetaminophen (TYLENOL) 500 MG tablet Take 1,000 mg by mouth every 6 (six) hours as needed for mild pain.   Yes [provider]  Ascorbic Acid (VITAMIN C PO) Take 1-2 tablets by mouth daily. TAKES 1 DAILY 2 IF FEELS LIKE GETTING SICK   Yes [provider]  buPROPion (WELLBUTRIN XL) 150 MG 24 hr tablet Take 450 mg by mouth daily.    Yes [provider]  Cholecalciferol (VITAMIN D-3) 1000 UNITS CAPS Take 1,000 Units by mouth daily.    Yes [provider]  citalopram (CELEXA)  40 MG tablet Take 40 mg by mouth every morning.   Yes [provider]  ferrous sulfate 325 (65 FE) MG tablet Take 325 mg by mouth daily with breakfast.   Yes [provider]  folic acid (FOLVITE) 1 MG tablet Take 2 mg by mouth daily.    Yes [provider]  loratadine (CLARITIN) 10 MG tablet Take 10 mg by mouth daily.   Yes [provider]  lurasidone (LATUDA) 20 MG TABS tablet Take 20 mg by mouth every evening.   Yes [provider]  Multiple Vitamins-Minerals (MULTIVITAMIN GUMMIES ADULT PO) Take 2 tablets by mouth daily.   Yes [provider]  naproxen sodium (ANAPROX) 220 MG tablet Take 220 mg by mouth  2 (two) times daily with a meal.   Yes [provider]  omeprazole (PRILOSEC) 40 MG capsule TAKE 1 CAPSULE BY MOUTH TWICE DAILY 02/26/15  Yes [provider]  Probiotic Product (ALIGN PO) Take 1 tablet by mouth daily.   Yes [provider]  Simethicone (GAS-X PO) Take 1 tablet by mouth 2 (two) times daily.    Yes [provider]  traZODone (DESYREL) 50 MG tablet Take 25 mg by mouth at bedtime. HAS NOT STARTED TAKING YET BUT PLANS TO START   Yes [provider]  aspirin EC 325 MG tablet Take 1 tablet (325 mg total) by mouth 2 (two) times daily after a meal. Take x 1 month post op to decrease risk of blood clots. 03/27/16   Marshia Ly, PA-C  Cyanocobalamin (B-12) 1000 MCG SUBL Place 1,000 mcg under the tongue 2 (two) times daily as needed (ENERGY).    [provider]  diltiazem (CARDIZEM CD) 120 MG 24 hr capsule Take 1 capsule (120 mg total) by mouth daily. 01/19/17   Rolland Porter, MD  docusate sodium (COLACE) 100 MG capsule Take 1 capsule (100 mg total) by mouth 2 (two) times daily. 03/27/16   Marshia Ly, PA-C  doxycycline (VIBRAMYCIN) 100 MG capsule Take 1 capsule (100 mg total) by mouth 2 (two) times daily. 03/27/16   Marshia Ly, PA-C  methocarbamol (ROBAXIN-750) 750 MG tablet Take 1 tablet (750 mg total) by mouth every 8 (eight) hours as needed for muscle spasms. 03/27/16   Marshia Ly, PA-C  nitrofurantoin, macrocrystal-monohydrate, (MACROBID) 100 MG capsule Take 100 mg by mouth 2 (two) times daily.    [provider]  oxyCODONE-acetaminophen (PERCOCET/ROXICET) 5-325 MG tablet Take 1-2 tablets by mouth every 6 (six) hours as needed for severe pain. 03/28/16   Marshia Ly, PA-C  topiramate (TOPAMAX) 50 MG tablet Take 2 tablets (100 mg total) by mouth 2 (two) times daily. Patient not taking: Reported on 03/18/2016 07/20/15   Micki Riley, MD  traMADol (ULTRAM) 50 MG tablet Take 1-2 tablets every 8 hours as needed for  pain. 08/20/15   Levert Feinstein, MD  traMADol (ULTRAM) 50 MG tablet Take by mouth 3 (three) times daily as needed.    [provider]    Family History Family History  Problem Relation Age of Onset  . Stroke Mother   . Seizures Mother   . Memory loss Mother   . Cancer Father        Salivary Gland   . Heart disease Father     Social History Social History  Substance Use Topics  . Smoking status: Never Smoker  . Smokeless tobacco: Never Used  . Alcohol use No     Allergies   Gabapentin; Metoprolol;  and Sulfa antibiotics   Review of Systems Review of Systems  Constitutional: Negative for appetite change, chills, diaphoresis, fatigue and fever.  HENT: Negative for mouth sores, sore throat and trouble swallowing.   Eyes: Negative for visual disturbance.  Respiratory: Negative for cough, chest tightness, shortness of breath and wheezing.   Cardiovascular: Positive for palpitations. Negative for chest pain.  Gastrointestinal: Negative for abdominal distention, abdominal pain, diarrhea, nausea and vomiting.  Endocrine: Negative for polydipsia, polyphagia and polyuria.  Genitourinary: Negative for dysuria, frequency and hematuria.  Musculoskeletal: Negative for gait problem.  Skin: Negative for color change, pallor and rash.  Neurological: Positive for dizziness and weakness. Negative for syncope, light-headedness and headaches.  Hematological: Does not bruise/bleed easily.  Psychiatric/Behavioral: Negative for behavioral problems and confusion.     Physical Exam Updated Vital Signs BP 108/75   Pulse 91   Temp 98.6 F (37 C) (Oral)   Resp 16   SpO2 93%   Physical Exam  Constitutional: She is oriented to person, place, and time. She appears well-developed and well-nourished. No distress.  Awake and alert.  HENT:  Head: Normocephalic.  Eyes: Pupils are equal, round, and reactive to light. Conjunctivae are normal. No scleral icterus.  Neck: Normal range of motion.  Neck supple. No thyromegaly present.  Cardiovascular: Normal rate and regular rhythm.  Exam reveals no gallop and no friction rub.   No murmur heard. Rapid. Regular. narrow complex tachycardia on the monitor.  Pulmonary/Chest: Effort normal and breath sounds normal. No respiratory distress. She has no wheezes. She has no rales.  Clear lungs. No JVD. No dependent edema.  Abdominal: Soft. Bowel sounds are normal. She exhibits no distension. There is no tenderness. There is no rebound.  Musculoskeletal: Normal range of motion.  Neurological: She is alert and oriented to person, place, and time.  Skin: Skin is warm and dry. No rash noted.  Psychiatric: She has a normal mood and affect. Her behavior is normal.     ED Treatments / Results  Labs (all labs ordered are listed, but only abnormal results are displayed) Labs Reviewed  BASIC METABOLIC PANEL - Abnormal; Notable for the following:       Result Value   BUN 21 (*)    Creatinine, Ser 1.06 (*)    GFR calc non Af Amer 55 (*)    All other components within normal limits  CBC WITH DIFFERENTIAL/PLATELET  MAGNESIUM  TSH    EKG  EKG Interpretation None       Radiology No results found.  Procedures .Cardioversion Date/Time: 01/19/2017 9:40 AM Performed by: Rolland Porter Authorized by: Rolland Porter   Consent:    Consent obtained:  Verbal and written   Consent given by:  Patient   Risks discussed:  Induced arrhythmia   Alternatives discussed:  No treatment, rate-control medication and alternative treatment Pre-procedure details:    Cardioversion basis:  Emergent   Rhythm:  Supraventricular tachycardia Patient sedated: No Post-procedure details:    Patient status:  Awake   Patient tolerance of procedure:  Tolerated well, no immediate complications Comments:     Chemical cardioversion. Given 6 mg of adenosine IV push. She had no AV block or conversion. This was repeated with 12 mg IV push Adenosine and had immediate  conversion to sinus rhythm and has maintained.     (including critical care time)  Medications Ordered in ED Medications  diltiazem (CARDIZEM CD) 24 hr capsule 120 mg (not administered)  adenosine (ADENOCARD) 6 MG/2ML injection (18  mg  Given 01/19/17 0949)     Initial Impression / Assessment and Plan / ED Course  I have reviewed the triage vital signs and the nursing notes.  Pertinent labs & imaging results that were available during my care of the patient were reviewed by me and considered in my medical decision making (see chart for details).   reassuring electrolytes. Patient symptom-free. I discussed the case with Dr. Rennie Plowman of cardiology. She will make note for the patient to be seen expeditiously in their clinic. Patient had ill side effects with metoprolol in the past for blood pressure. Placed on Cardizem 120 daily. We'll hold her lisinopril as her pressure is 120. Follow-up with cardiology as above. ER with acute changes.  CRITICAL CARE Performed by: Rolland Porter JOSEPH   Total critical care time: 15 minutes  Critical care time was exclusive of separately billable procedures and treating other patients.  Critical care was necessary to treat or prevent imminent or life-threatening deterioration.  Critical care was time spent personally by me on the following activities: development of treatment plan with patient and/or surrogate as well as nursing, discussions with consultants, evaluation of patient's response to treatment, examination of patient, obtaining history from patient or surrogate, ordering and performing treatments and interventions, ordering and review of laboratory studies, ordering and review of radiographic studies, pulse oximetry and re-evaluation of patient's condition.   Final Clinical Impressions(s) / ED Diagnoses   Final diagnoses:  PSVT (paroxysmal supraventricular tachycardia) (HCC)    New Prescriptions New Prescriptions   DILTIAZEM (CARDIZEM CD)  120 MG 24 HR CAPSULE    Take 1 capsule (120 mg total) by mouth daily.     Rolland Porter, MD 01/19/17 1135

## 2017-01-19 NOTE — ED Notes (Signed)
States she feels much better. Skin clammy prior to cardioversion. Warm and dry now.

## 2017-01-19 NOTE — ED Notes (Signed)
Adenosine  given rapid IV push followed by bolus and heart rate decreased down to 90-110s and heart rhythm is in ST 104 currently.

## 2017-01-19 NOTE — ED Notes (Signed)
Adenosine  given rapid push followed by bolus and heart rate decreased to 130s and no pauses

## 2017-01-19 NOTE — ED Triage Notes (Signed)
Patient complains of heart racing with dizziness that started last night. States that she had racing before that resolves without treatment, No CP

## 2017-01-19 NOTE — ED Notes (Signed)
Pt verbalized understanding of d/c instructions and has no further questions. VSS, NAD. Pt in NSR. Pt has appointment Thursday with Cards.

## 2017-01-19 NOTE — Progress Notes (Addendum)
Cardiology Office Note    Date:  01/21/2017  ID:  Lauren Huffman, Lauren Huffman September 08, 1955, MRN [id number] PCP:  Gweneth Dimitri, MD  Cardiologist:  New Dr. Mayford Knife   Chief Complaint: SVT  History of Present Illness:  Lauren Huffman is a 61 y.o. female with history of carpal tunnel, HTN, HLD, depression, anxiety, sleep apnea, varicose veins s/p ablation who is being seen today for the evaluation of SVT at the request of Dr. Rolland Porter. Pt states that Dr. Fayrene Fearing had discussed the pt with Dr. Mayford Knife and told the patient that her appointment would be with Dr. Mayford Knife. She would like to be followed by Dr. Mayford Knife as she prefers a female provider.   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 She was evaluated in the ER 10/16 with palpitations, dizziness, lightheadedness and nausea. She was due to take a train to [place] to see a friend who has been diagnosed with cancer. She canceled the trip because of her symptoms. In the ER she was found to be in SVT with HR of 157. She was given  adenosine with no effect, then chemical conversion to NSR with  of adenosine. She was started on Diltiazem 120 mg daily and lisinopril was stopped. Note she had prior ill effects with metoprolol. The patient does report prior history of symptoms less than 2 weeks ago. Labs showed normal TSH, Mg 2.1, K 4.3, Cr 1.06 (baseline 0.5-0.7), Hgb 14.5. Prior EKG showed IRBBB.  Lauren Huffman is here today for evaluation with her daughter. She has had brief episodes of heart racing for about the past 5 years. Has always resolved on its own. On 10/15 she noted her heart racing at bedtime with no other associated symptoms. She was able to go to sleep. She awoke the next morning feeling bad with nausea, dizziness, headache and heart racing. She presented to the ED. She had no chest discomfort. She is fairly active although she does not exercise due to multiple orthopedic issues. Aside from this episode she has had no exertional chest discomfort or dyspnea, nor has  she had orthopnea, PND or edema.    She has been diagnosed with sleep apnea and has not been able to tolerate CPAP. She still has her machine. She has lost 45 pounds over the last year. She was drinking 1-2 cups of coffee per day along with an occasion glass of tea and dark chocolate daily. She is now trying to cut down on her caffeine intake.   Of note, her sister has recently developed SVT in the setting of stress while trying to finish her PHD. There is family history of CAD, especially on her father's side with several uncles paternal uncles having had heart attacks. The patient's mother had a stent around age 77 and she is currently 78. The patient has never smoked and she has recently started drinking a glass of wine per week.     Past Medical History:  Diagnosis Date  . Anxiety   . Arthritis   . Carpal tunnel syndrome    Right  . Depression   . Headache(784.0)   . Hiatal hernia   . Hyperlipemia   . Hypertension   . Left arm pain   . Mental disorder   . Osteoarthritis   . Sleep apnea    DOES NOT WEAR CPAP    . Status post ablation of incompetent vein using laser 2x in Nov. 2017   right  . SVT (supraventricular tachycardia) (HCC)   .  TMJ (dislocation of temporomandibular joint)   . Varicose veins     Past Surgical History:  Procedure Laterality Date  . CYST REMOVAL HAND Left   . JOINT REPLACEMENT Right    knee  . KNEE ARTHROPLASTY  12/25/2011   Procedure: COMPUTER ASSISTED TOTAL KNEE ARTHROPLASTY;  Surgeon: [name], MD;  Location: [facility] OR;  Service: Orthopedics;  Laterality: Right;  Right total knee replacement, general anesthesia, femoral nerve block  . KNEE ARTHROPLASTY Left 07/08/2012   Procedure: COMPUTER ASSISTED TOTAL KNEE ARTHROPLASTY;  Surgeon: [name], MD;  Location: [facility] OR;  Service: Orthopedics;  Laterality: Left;  PRE OP FEMORAL NERVE BLOCK  . lazer vein surgery  2017   right leg  . TOTAL HIP ARTHROPLASTY Right 03/27/2016  . TOTAL HIP ARTHROPLASTY  Right 03/27/2016   Procedure: TOTAL HIP ARTHROPLASTY ANTERIOR APPROACH;  Surgeon: [name], MD;  Location: [facility] OR;  Service: Orthopedics;  Laterality: Right;  . TOTAL KNEE ARTHROPLASTY Left 07/08/2012   [name]    Current Medications: Current Meds  Medication Sig  . Ascorbic Acid (VITAMIN C PO) Take 1-2 tablets by mouth daily. TAKES 1 DAILY 2 IF FEELS LIKE GETTING SICK  . buPROPion (WELLBUTRIN XL) 150 MG 24 hr tablet Take 450 mg by mouth daily.   Marland Kitchen CALCIUM PO Take 1 Dose by mouth daily. 1 dose=2 gummies  . Cholecalciferol (VITAMIN D-3) 1000 UNITS CAPS Take 1,000 Units by mouth daily.   . citalopram (CELEXA) 40 MG tablet Take 40 mg by mouth every morning.  . Cyanocobalamin (B-12) 1000 MCG SUBL Place 1,000 mcg under the tongue 2 (two) times daily as needed (ENERGY).  Marland Kitchen diltiazem (CARDIZEM CD) 120 MG 24 hr capsule Take 1 capsule (120 mg total) by mouth daily.  . ferrous sulfate 325 (65 FE) MG tablet Take 325 mg by mouth daily with breakfast.  . folic acid (FOLVITE) 1 MG tablet Take 2 mg by mouth daily.   Marland Kitchen loratadine (CLARITIN) 10 MG tablet Take 10 mg by mouth daily.  Marland Kitchen lurasidone (LATUDA) 20 MG TABS tablet Take 20 mg by mouth every evening.  . Multiple Vitamins-Minerals (MULTIVITAMIN GUMMIES ADULT PO) Take 2 tablets by mouth daily.  . naproxen sodium (ANAPROX) 220 MG tablet Take 220 mg by mouth 2 (two) times daily with a meal.  . omeprazole (PRILOSEC) 40 MG capsule TAKE 1 CAPSULE BY MOUTH TWICE DAILY  . Probiotic Product (ALIGN PO) Take 1 tablet by mouth daily.  . Simethicone (GAS-X PO) Take 1 tablet by mouth 2 (two) times daily.   . traZODone (DESYREL) 50 MG tablet Take 25 mg by mouth at bedtime.      Allergies:   Gabapentin; Metoprolol; and Sulfa antibiotics   Social History   Social History  . Marital status: Married    Spouse name: N/A  . Number of children: 2  . 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 Smoking  status: Never Smoker  . Smokeless tobacco: Never Used  . Alcohol use 0.6 oz/week    1 Glasses of wine per week  . Drug use: No  . Sexual activity: Not Asked   Other Topics Concern  . None   Social History Narrative   Lives at home with husband and daughter.   2-4 cups caffeine daily.   Right-handed.     Family History:  Family History  Problem Relation Age of Onset  . Stroke Mother   .  Seizures Mother   . Memory loss Mother   . CAD Mother        had a stent  . Hyperlipidemia Mother   . Cancer Father        Salivary Gland   . Heart disease Father        2 silent MI's  . Supraventricular tachycardia Sister   . Hyperlipidemia Sister      ROS:   Please see the history of present illness.  All other systems are reviewed and otherwise negative.    PHYSICAL EXAM:   VS:  BP 120/74   Pulse 87   Ht  (1.778 m)   Wt 229 lb 12.8 oz (104.2 kg)   BMI 32.97 kg/m   BMI: Body mass index is 32.97 kg/m. GEN: Well nourished, well developed, in no acute distress  HEENT: normocephalic, atraumatic Neck: no JVD, carotid bruits, or masses Cardiac: RRR; no murmurs, rubs, or gallops, no edema  Respiratory:  clear to auscultation bilaterally, normal work of breathing GI: soft, nontender, nondistended, + BS Lauren: arthritic changes noted to both feet.  Skin: warm and dry, no rash Neuro:  Alert and Oriented x 3, Strength and sensation are intact, follows commands Psych: euthymic mood, full affect  Wt Readings from Last 3 Encounters:  01/21/17 229 lb 12.8 oz (104.2 kg)  03/27/16 236 lb (107 kg)  03/23/16 236 lb 8 oz (107.3 kg)     Studies/Labs Reviewed:   EKG:  EKG was ordered today and personally reviewed by me and demonstrates NSR with incomplete RBBB, biatrial enlargement  Recent Labs: 03/23/2016: ALT 13 01/19/2017: BUN 21; Creatinine, Ser 1.06; Hemoglobin 14.5; Magnesium 2.1; Platelets 344; Potassium 4.3; Sodium 140; TSH 2.792   Lipid Panel No results found for: CHOL,  TRIG, HDL, CHOLHDL, VLDL, LDLCALC, LDLDIRECT  Additional studies/ records that were reviewed today include: Summarized above.    ASSESSMENT & PLAN:   This patient's case was discussed in depth with Dr. Katrinka Blazing. The plan below was formulated per our discussion.  1. SVT: Pt seen in the ED 01/19/17 for episode of symptomatic SVT, conversion to NSR with second dose of Adenosine. Has had previous brief episodes of self resolving heart racing for the last 5 years. All labs were normal. Pt was under stress preparing for a train trip to [place] to visitor a friend that was just diagnosed with cancer. She has daily caffeine intake. Pt was started on cardizem and is tolerating it so far. No episodes since 10/16. No symptoms consistent with myocardial ischemia. EKG shows conduction delay that was present on EKG in 2013.  -check echo for structural abnormalities  -Check 30 day event monitor for arrhythmia burden and evaluation of medical management   -Pt taught valsalva maneuver to try to terminate episodes of SVT  -Advised to limit caffeine intake and work on stress management  2. RBBB: was present in 2013 also.  No symptoms of myocardial ischemia. Will check echo for structural abnormalities.   3. Hypertension: was diagnosed a year ago. Lisinopril was stopped to make room for cardizem. BP currently well controlled.  4. OSA: Has CPAP but is unable to tolerate it. She plans to work on getting a different mask that she may tolerate better. We have discussed the possible relationship between OSA and cardiac arrhythmias.   Disposition: F/u with Dr. Mayford Knife after results of 30 day monitor   Medication Adjustments/Labs and Tests Ordered: Current medicines are reviewed at length with the patient today.  Concerns  regarding medicines are outlined above. Medication changes, Labs and Tests ordered today are summarized above and listed in the Patient Instructions accessible in Encounters.   Signed, Berton Bon, NP 01/21/2017 12:31 PM    [facility] [address] [place], [place], [place]  [address] Phone: [phone]; Fax: [phone]

## 2017-01-21 ENCOUNTER — Ambulatory Visit (INDEPENDENT_AMBULATORY_CARE_PROVIDER_SITE_OTHER): Payer: BC Managed Care – PPO | Admitting: Cardiology

## 2017-01-21 ENCOUNTER — Encounter: Payer: Self-pay | Admitting: Cardiology

## 2017-01-21 VITALS — BP 120/74 | HR 87 | Ht 70.0 in | Wt 229.8 lb

## 2017-01-21 DIAGNOSIS — I471 Supraventricular tachycardia: Secondary | ICD-10-CM | POA: Diagnosis not present

## 2017-01-21 DIAGNOSIS — I4510 Unspecified right bundle-branch block: Secondary | ICD-10-CM | POA: Diagnosis not present

## 2017-01-21 DIAGNOSIS — G4733 Obstructive sleep apnea (adult) (pediatric): Secondary | ICD-10-CM

## 2017-01-21 DIAGNOSIS — I10 Essential (primary) hypertension: Secondary | ICD-10-CM | POA: Diagnosis not present

## 2017-01-21 NOTE — Addendum Note (Signed)
Addended by: Berton BonHAMMOND, Randall Rampersad D on: 01/21/2017 04:57 PM   Modules accepted: Level of Service

## 2017-01-21 NOTE — Patient Instructions (Addendum)
Medication Instructions: Your physician recommends that you continue on your current medications as directed. Please refer to the Current Medication list given to you today.  Labwork: None Ordered  Procedures/Testing: Your physician has requested that you have an echocardiogram. Echocardiography is a painless test that uses sound waves to create images of your heart. It provides your doctor with information about the size and shape of your heart and how well your heart's chambers and valves are working. This procedure takes approximately one hour. There are no restrictions for this procedure.  Your physician has recommended that you wear a 30 day event monitor - Please have precert check patients insurance prior to scheduling so patient can be aware of patient cost. Holter monitors are medical devices that record the heart's electrical activity. Doctors most often use these monitors to diagnose arrhythmias. Arrhythmias are problems with the speed or rhythm of the heartbeat. The monitor is a small, portable device. You can wear one while you do your normal daily activities. This is usually used to diagnose what is causing palpitations/syncope (passing out).   Follow-Up: Your physician recommends that you schedule a follow-up appointment after you event monitor with Dr. Mayford Knifeurner   If you need a refill on your cardiac medications before your next appointment, please call your pharmacy.

## 2017-02-03 ENCOUNTER — Other Ambulatory Visit: Payer: Self-pay

## 2017-02-03 ENCOUNTER — Ambulatory Visit (HOSPITAL_COMMUNITY): Payer: BC Managed Care – PPO | Attending: Cardiovascular Disease

## 2017-02-03 DIAGNOSIS — I081 Rheumatic disorders of both mitral and tricuspid valves: Secondary | ICD-10-CM | POA: Diagnosis not present

## 2017-02-03 DIAGNOSIS — I471 Supraventricular tachycardia: Secondary | ICD-10-CM

## 2017-02-03 DIAGNOSIS — I10 Essential (primary) hypertension: Secondary | ICD-10-CM | POA: Diagnosis not present

## 2017-02-03 DIAGNOSIS — I119 Hypertensive heart disease without heart failure: Secondary | ICD-10-CM | POA: Insufficient documentation

## 2017-02-03 DIAGNOSIS — I4510 Unspecified right bundle-branch block: Secondary | ICD-10-CM | POA: Diagnosis not present

## 2017-02-03 DIAGNOSIS — F419 Anxiety disorder, unspecified: Secondary | ICD-10-CM | POA: Diagnosis not present

## 2017-02-03 DIAGNOSIS — E785 Hyperlipidemia, unspecified: Secondary | ICD-10-CM | POA: Insufficient documentation

## 2017-02-03 DIAGNOSIS — G4730 Sleep apnea, unspecified: Secondary | ICD-10-CM | POA: Diagnosis not present

## 2017-02-04 ENCOUNTER — Telehealth: Payer: Self-pay

## 2017-02-04 MED ORDER — DILTIAZEM HCL ER COATED BEADS 120 MG PO CP24
120.0000 mg | ORAL_CAPSULE | Freq: Every day | ORAL | 3 refills | Status: DC
Start: 2017-02-04 — End: 2018-01-06

## 2017-02-04 NOTE — Telephone Encounter (Signed)
Informed patient of echo results while on the phone, patient requested refills on Cardizem. Cardizem sent to pharmacy. Informed patient to call back with any concerns.

## 2017-06-17 ENCOUNTER — Telehealth: Payer: Self-pay | Admitting: Cardiology

## 2017-06-17 NOTE — Telephone Encounter (Signed)
Has she had any further episodes of palpitations?  If no then stop Cardizem.  If she has any reoccurence of palpitations then will refer to EP

## 2017-06-17 NOTE — Telephone Encounter (Signed)
Called patient, unable to leave message due to mailbox is full

## 2017-06-17 NOTE — Telephone Encounter (Signed)
New Message   Pt c/o medication issue:  1. Name of Medication: diltiazem (CARDIZEM CD) 120 MG 24 hr capsule  2. How are you currently taking this medication (dosage and times per day)? Once a day   3. Are you having a reaction (difficulty breathing--STAT)?  4. What is your medication issue? Patient states that this is acausing severe depression

## 2017-06-17 NOTE — Telephone Encounter (Signed)
I spoke with patient, pt has been dealing with depression since December and major depression for the past month, to the point of not getting out the bed. Patient feels the cardizem is causing her major depression. Patient spoke with her psychiatrist who recommends speaking with cardiologist about discontinuing Cardizem. Patient was admitted to ER on 01/19/17 with SVT and started on cardizem 120 mg once a day. Patient had f/u with Berton BonJanine Hammond, NP on 01/21/17  She ordered echo and event monitor.   ECHO results Notes recorded by Berton BonHammond, Janine, NP on 02/04/2017 at 8:04 AM EDT Please let the patient know that her echocardiogram did not show any significant abnormalities. Her pumping function is normal. She has mild leakiness of the mitral valve that is not significant. Continue with current therapy.   Patient refused event monitor. Patient denies all symptoms. I scheduled patient with Ronie Spiesayna Dunn, PA on 06/22/17. As she will be traveling out the country on 06/27/17. Informed patient I would send to Dr. Mayford Knifeurner for further recommendations. She verbalized understanding and thankful for the call.

## 2017-06-22 ENCOUNTER — Ambulatory Visit: Payer: BC Managed Care – PPO | Admitting: Physician Assistant

## 2017-06-22 NOTE — Telephone Encounter (Signed)
Left message on VM, per Dr. Mayford Knifeurner okay to stop Cardizem if she is not having palpitations, patient is scheduled with Dorna MaiLori Gerhardht, NP on 3/20.

## 2017-06-22 NOTE — Progress Notes (Deleted)
CARDIOLOGY OFFICE NOTE  Date:  06/22/2017    Lauren Huffman Fabrizio Date of Birth: May 30, 1955 Medical Record #[id number]  PCP:  Gweneth DimitriMcNeill, Wendy, MD  Cardiologist:  Tyrone SageGerhardt & ***    No chief complaint on file.   History of Present Illness: Lauren Huffman Limb is a 62 y.o. female who presents today for a ***  female with history of carpal tunnel, HTN, HLD, depression, anxiety, sleep apnea, varicose veins s/p ablation who is being seen today for the evaluation of SVT at the request of Dr. Rolland PorterMark James. Pt states that Dr. Fayrene FearingJames had discussed the pt with Dr. Mayford Knifeurner and told the patient that her appointment would be with Dr. Mayford Knifeurner. She would like to be followed by Dr. Mayford Knifeurner as she prefers a female provider.   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 She was evaluated in the ER 10/16 with palpitations, dizziness, lightheadedness and nausea. She was due to take a train to [place] to see a friend who has been diagnosed with cancer. She canceled the trip because of her symptoms. In the ER she was found to be in SVT with HR of 157. She was given 6mg  adenosine with no effect, then chemical conversion to NSR with 12mg  of adenosine. She was started on Diltiazem 120 mg daily and lisinopril was stopped. Note she had prior ill effects with metoprolol. The patient does report prior history of symptoms less than 2 weeks ago. Labs showed normal TSH, Mg 2.1, K 4.3, Cr 1.06 (baseline 0.5-0.7), Hgb 14.5. Prior EKG showed IRBBB.  Ms Lauren Huffman is here today for evaluation with her daughter. She has had brief episodes of heart racing for about the past 5 years. Has always resolved on its own. On 10/15 she noted her heart racing at bedtime with no other associated symptoms. She was able to go to sleep. She awoke the next morning feeling bad with nausea, dizziness, headache and heart racing. She presented to the ED. She had no chest discomfort. She is fairly active although she does not exercise due to multiple orthopedic issues. Aside from this  episode she has had no exertional chest discomfort or dyspnea, nor has she had orthopnea, PND or edema.    She has been diagnosed with sleep apnea and has not been able to tolerate CPAP. She still has her machine. She has lost 45 pounds over the last year. She was drinking 1-2 cups of coffee per day along with an occasion glass of tea and dark chocolate daily. She is now trying to cut down on her caffeine intake.   Of note, her sister has recently developed SVT in the setting of stress while trying to finish her PHD. There is family history of CAD, especially on her father's side with several uncles paternal uncles having had heart attacks. The patient's mother had a stent around age 64 and she is currently 8191. The patient has never smoked and she has recently started drinking a glass of wine per week.     Comes in today. Here with   Past Medical History:  Diagnosis Date  . Anxiety   . Arthritis   . Carpal tunnel syndrome    Right  . Depression   . Headache(784.0)   . Hiatal hernia   . Hyperlipemia   . Hypertension   . Left arm pain   . Mental disorder   . Osteoarthritis   . Sleep apnea    DOES NOT WEAR CPAP    . Status post  ablation of incompetent vein using laser 2x in Nov. 2017   right  . SVT (supraventricular tachycardia) (HCC)   . TMJ (dislocation of temporomandibular joint)   . Varicose veins     Past Surgical History:  Procedure Laterality Date  . CYST REMOVAL HAND Left   . JOINT REPLACEMENT Right    knee  . KNEE ARTHROPLASTY  12/25/2011   Procedure: COMPUTER ASSISTED TOTAL KNEE ARTHROPLASTY;  Surgeon: [name], MD;  Location: [facility] OR;  Service: Orthopedics;  Laterality: Right;  Right total knee replacement, general anesthesia, femoral nerve block  . KNEE ARTHROPLASTY Left 07/08/2012   Procedure: COMPUTER ASSISTED TOTAL KNEE ARTHROPLASTY;  Surgeon: [name], MD;  Location: [facility] OR;  Service: Orthopedics;  Laterality: Left;  PRE OP FEMORAL NERVE BLOCK  . lazer vein  surgery  2017   right leg  . TOTAL HIP ARTHROPLASTY Right 03/27/2016  . TOTAL HIP ARTHROPLASTY Right 03/27/2016   Procedure: TOTAL HIP ARTHROPLASTY ANTERIOR APPROACH;  Surgeon: [name], MD;  Location: [facility] OR;  Service: Orthopedics;  Laterality: Right;  . TOTAL KNEE ARTHROPLASTY Left 07/08/2012   [name]     Medications: No outpatient medications have been marked as taking for the 06/23/17 encounter (Appointment) with Lauren Macadamia, NP.     Allergies: Allergies  Allergen Reactions  . Gabapentin Other (See Comments)    Weakness and muscle contractions  . Metoprolol Other (See Comments)    INCREASED DEPRESSION   . Sulfa Antibiotics     Family History    Social History: The patient  reports that  has never smoked. she has never used smokeless tobacco. She reports that she drinks about 0.6 oz of alcohol per week. She reports that she does not use drugs.   Family History: The patient's ***family history includes CAD in her mother; Cancer in her father; Heart disease in her father; Hyperlipidemia in her mother and sister; Memory loss in her mother; Seizures in her mother; Stroke in her mother; Supraventricular tachycardia in her sister.   Review of Systems: Please see the history of present illness.   Otherwise, the review of systems is positive for {NONE DEFAULTED:18576::"none"}.   All other systems are reviewed and negative.   Physical Exam: VS:  There were no vitals taken for this visit. Marland Kitchen  BMI There is no height or weight on file to calculate BMI.  Wt Readings from Last 3 Encounters:  01/21/17 229 lb 12.8 oz (104.2 kg)  03/27/16 236 lb (107 kg)  03/23/16 236 lb 8 oz (107.3 kg)    General: Pleasant. Well developed, well nourished and in no acute distress.   HEENT: Normal.  Neck: Supple, no JVD, carotid bruits, or masses noted.  Cardiac: ***Regular rate and rhythm. No murmurs, rubs, or gallops. No edema.  Respiratory:  Lungs are clear to auscultation bilaterally with  normal work of breathing.  GI: Soft and nontender.  MS: No deformity or atrophy. Gait and ROM intact.  Skin: Warm and dry. Color is normal.  Neuro:  Strength and sensation are intact and no gross focal deficits noted.  Psych: Alert, appropriate and with normal affect.   LABORATORY DATA:  EKG:  EKG {ACTION; IS/IS ZOX:09604540} ordered today. This demonstrates ***.  Lab Results  Component Value Date   WBC 10.4 01/19/2017   HGB 14.5 01/19/2017   HCT 44.0 01/19/2017   PLT 344 01/19/2017   GLUCOSE 92 01/19/2017   ALT 13 (L) 03/23/2016   AST 18 03/23/2016  NA 140 01/19/2017   K 4.3 01/19/2017   CL 107 01/19/2017   CREATININE 1.06 (H) 01/19/2017   BUN 21 (H) 01/19/2017   CO2 23 01/19/2017   TSH 2.792 01/19/2017   INR 0.96 03/23/2016     BNP (last 3 results) No results for input(s): BNP in the last 8760 hours.  ProBNP (last 3 results) No results for input(s): PROBNP in the last 8760 hours.   Other Studies Reviewed Today:   Assessment/Plan: 1. SVT: Pt seen in the ED 01/19/17 for episode of symptomatic SVT, conversion to NSR with second dose of Adenosine. Has had previous brief episodes of self resolving heart racing for the last 5 years. All labs were normal. Pt was under stress preparing for a train trip to [place] to visitor a friend that was just diagnosed with cancer. She has daily caffeine intake. Pt was started on cardizem and is tolerating it so far. No episodes since 10/16. No symptoms consistent with myocardial ischemia. EKG shows conduction delay that was present on EKG in 2013.             -check echo for structural abnormalities             -Check 30 day event monitor for arrhythmia burden and evaluation of medical management               -Pt taught valsalva maneuver to try to terminate episodes of SVT             -Advised to limit caffeine intake and work on stress management  2. RBBB: was present in 2013 also.  No symptoms of myocardial ischemia. Will check  echo for structural abnormalities.              3. Hypertension: was diagnosed a year ago. Lisinopril was stopped to make room for cardizem. BP currently well controlled.  4. OSA: Has CPAP but is unable to tolerate it. She plans to work on getting a different mask that she may tolerate better. We have discussed the possible relationship between OSA and cardiac arrhythmias.   Current medicines are reviewed with the patient today.  The patient does not have concerns regarding medicines other than what has been noted above.  The following changes have been made:  See above.  Labs/ tests ordered today include:   No orders of the defined types were placed in this encounter.    Disposition:   FU with *** in {gen number 1-61:096045} {Days to years:10300}.   Patient is agreeable to this plan and will call if any problems develop in the interim.   SignedNorma Fredrickson, NP  06/22/2017 4:26 PM  [facility] [address] [place], [place]  [address] Phone: [phone] Fax: [phone]

## 2017-06-23 ENCOUNTER — Ambulatory Visit: Payer: BC Managed Care – PPO | Admitting: Nurse Practitioner

## 2018-01-06 ENCOUNTER — Encounter

## 2018-01-06 ENCOUNTER — Encounter: Payer: Self-pay | Admitting: Neurology

## 2018-01-06 ENCOUNTER — Encounter: Payer: Self-pay | Admitting: *Deleted

## 2018-01-06 ENCOUNTER — Ambulatory Visit: Payer: BC Managed Care – PPO | Admitting: Neurology

## 2018-01-06 VITALS — BP 148/86 | HR 100 | Ht 70.0 in | Wt 194.0 lb

## 2018-01-06 DIAGNOSIS — R413 Other amnesia: Secondary | ICD-10-CM

## 2018-01-06 NOTE — Progress Notes (Signed)
PATIENT: Lauren Huffman DOB: 10-Jun-1955  Chief Complaint  Patient presents with  . Memory Loss    MMSE 30/30 - 9 animals.  She is here with her husband, Lauren Huffman and her daughter, Lauren Huffman.  She would like to have her memory loss and personality changes evaluated.  She stopped taking Latuda one month ago.  Marland Kitchen PCP    Gweneth Dimitri, MD     HISTORICAL  Lauren Huffman is a 62 year old female, seen in request by her primary care physician Dr. Gweneth Dimitri, her memory loss, she is accompanied by her daughter Lauren Huffman daughter Lauren Huffman at today's clinical visit, initial evaluation was on January 06 2018.  I have reviewed and summarized the referring note from the referring physician.   She had a past medical history of depression, right carpal tunnel syndromes, obstructive sleep apnea, but could not tolerate CPAP machine, osteoarthritis, bilateral knee replacement, I saw her in the past for left shoulder, elbow pain in 2016, overall has much improved  Today she comes in for personality change, memory loss  She reported long history of depression, over the years, is on polypharmacy treatment, Wellbutrin 150 mg 3 tablets a day, Celexa 40 mg a day, trazodone 50 mg 2 tablets every night,  She suffered SVT cardiac event in fall 2018, was given Cardizem, and daily aspirin, concurrent with her worsening health status, left hip pain, pending left hip surgery, she suffered worsening depression,  She was started on Latuda since beginning of 2019, she was on it for 6 months, during that period of time, she was noted to have sudden worsening of memory loss, withdrawal from her busy social interaction, she would not talk with people, barely leave her house, no longer enthusiastic about the responsibility of visiting her mother regularly, sometimes she is staying in bed until 2 PM, missing her meals.  After Kasandra Knudsen was stopped a few weeks ago, there was no significant change in her depression, there might small  improvement in her excessive drowsiness, fatigue,  She was noted to have gradual onset memory loss since her left hip replacement in December 2017, after going through general anesthesia, there was mild improvement at following months, with her pending elective left hip surgery soon, family wants to do neurological investigation, make sure she is safe to go through general anesthesia and elective surgery.  Laboratory evaluations in July 2019, normal CMP, creatinine of 0.76, CBC with hemoglobin of 14.3, normal TSH 1.62  REVIEW OF SYSTEMS: Full 14 system review of systems performed and notable only for as above All other review of systems were negative.  ALLERGIES: Allergies  Allergen Reactions  . Gabapentin Other (See Comments)    Weakness and muscle contractions  . Metoprolol Other (See Comments)    INCREASED DEPRESSION   . Sulfa Antibiotics     Family History    HOME MEDICATIONS: Current Outpatient Medications  Medication Sig Dispense Refill  . Ascorbic Acid (VITAMIN C PO) Take 1-2 tablets by mouth daily. TAKES 1 DAILY 2 IF FEELS LIKE GETTING SICK    . buPROPion (WELLBUTRIN XL) 150 MG 24 hr tablet Take 450 mg by mouth daily.     . citalopram (CELEXA) 40 MG tablet Take 40 mg by mouth every morning.    . ferrous sulfate 325 (65 FE) MG tablet Take 325 mg by mouth daily with breakfast.    . folic acid (FOLVITE) 1 MG tablet Take 2 mg by mouth daily.     Marland Kitchen loratadine (CLARITIN) 10  MG tablet Take 10 mg by mouth daily.    . Multiple Vitamins-Minerals (MULTIVITAMIN GUMMIES ADULT PO) Take 2 tablets by mouth daily.    . naproxen sodium (ANAPROX) 220 MG tablet Take 220 mg by mouth 2 (two) times daily with a meal.    . omeprazole (PRILOSEC) 40 MG capsule TAKE 1 CAPSULE BY MOUTH TWICE DAILY  11  . Probiotic Product (ALIGN PO) Take 1 tablet by mouth daily.    . Simethicone (GAS-X PO) Take 1 tablet by mouth 2 (two) times daily.     . traZODone (DESYREL) 50 MG tablet Take 25 mg by mouth at bedtime.       No current facility-administered medications for this visit.     PAST MEDICAL HISTORY: Past Medical History:  Diagnosis Date  . Aneurysm, lower extremity (HCC)   . Anxiety   . Arthritis   . Carpal tunnel syndrome    Right  . Chronic pain   . Depression   . GERD (gastroesophageal reflux disease)   . Headache(784.0)   . Hiatal hernia   . History of colon polyps   . History of iron deficiency   . Hyperlipemia   . Hypertension   . Left arm pain   . Memory difficulty   . Mental disorder   . OSA (obstructive sleep apnea)   . Osteoarthritis   . Sleep apnea    DOES NOT WEAR CPAP    . Sleep disturbance   . Status post ablation of incompetent vein using laser 2x in Nov. 2017   right  . SVT (supraventricular tachycardia) (HCC)   . TMJ (dislocation of temporomandibular joint)   . Varicose veins   . Vitamin D insufficiency     PAST SURGICAL HISTORY: Past Surgical History:  Procedure Laterality Date  . CYST REMOVAL HAND Left   . JOINT REPLACEMENT Right    knee  . KNEE ARTHROPLASTY  12/25/2011   Procedure: COMPUTER ASSISTED TOTAL KNEE ARTHROPLASTY;  Surgeon: [name], MD;  Location: [facility] OR;  Service: Orthopedics;  Laterality: Right;  Right total knee replacement, general anesthesia, femoral nerve block  . KNEE ARTHROPLASTY Left 07/08/2012   Procedure: COMPUTER ASSISTED TOTAL KNEE ARTHROPLASTY;  Surgeon: [name], MD;  Location: [facility] OR;  Service: Orthopedics;  Laterality: Left;  PRE OP FEMORAL NERVE BLOCK  . lazer vein surgery  2017   right leg  . TOTAL HIP ARTHROPLASTY Right 03/27/2016  . TOTAL HIP ARTHROPLASTY Right 03/27/2016   Procedure: TOTAL HIP ARTHROPLASTY ANTERIOR APPROACH;  Surgeon: [name], MD;  Location: [facility];  Service: Orthopedics;  Laterality: Right;  . TOTAL KNEE ARTHROPLASTY Left 07/08/2012   [name]    FAMILY HISTORY: Family History  Problem Relation Age of Onset  . Stroke Mother   . Seizures Mother   . Memory loss Mother   . CAD Mother          had a stent  . Hyperlipidemia Mother   . Cancer Father        Salivary Gland   . Heart disease Father        2 silent MI's  . Supraventricular tachycardia Sister   . Hyperlipidemia Sister     SOCIAL HISTORY: Social History   Socioeconomic History  . Marital status: Married    Spouse name: Not on file  . Number of children: 2  . Years of education: Masters  . Highest education level: Not on file  Occupational History  . 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 Financial resource strain: Not on file  . Food insecurity:    Worry: Not on file    Inability: Not on file  . Transportation needs:    Medical: Not on file    Non-medical: Not on file  Tobacco Use  . Smoking status: Never Smoker  . Smokeless tobacco: Never Used  Substance and Sexual Activity  . Alcohol use: Not Currently  . Drug use: No  . Sexual activity: Not on file  Lifestyle  . Physical activity:    Days per week: Not on file    Minutes per session: Not on file  . Stress: Not on file  Relationships  . Social connections:    Talks on phone: Not on file    Gets together: Not on file    Attends religious service: Not on file    Active member of club or organization: Not on file    Attends meetings of clubs or organizations: Not on file    Relationship status: Not on file  . Intimate partner violence:    Fear of current or ex partner: Not on file    Emotionally abused: Not on file    Physically abused: Not on file    Forced sexual activity: Not on file  Other Topics Concern  . Not on file  Social History Narrative   Lives at home with husband.   No daily caffeine use.   Right-handed.     PHYSICAL EXAM   Vitals:   01/06/18 1351  BP: (!) 148/86  Pulse: 100  Weight: 194 lb (88 kg)  Height: 5\' 10"  (1.778 m)    Not recorded      Body mass index is 27.84 kg/m.  PHYSICAL EXAMNIATION:  Gen: NAD, conversant, well nourised, obese, well groomed                      Cardiovascular: Regular rate rhythm, no peripheral edema, warm, nontender. Eyes: Conjunctivae clear without exudates or hemorrhage Neck: Supple, no carotid bruits. Pulmonary: Clear to auscultation bilaterally   NEUROLOGICAL EXAM: MMSE - Mini Mental State Exam 01/06/2018  Orientation to time 5  Orientation to Place 5  Registration 3  Attention/ Calculation 5  Recall 3  Language- name 2 objects 2  Language- repeat 1  Language- follow 3 step command 3  Language- read & follow direction 1  Write a sentence 1  Copy design 1  Total score 30  Animal naming 9  CRANIAL NERVES: CN II: Visual fields are full to confrontation. Pupils are round equal and briskly reactive to light. CN III, IV, VI: extraocular movement are normal. No ptosis. CN V: Facial sensation is intact to pinprick in all 3 divisions bilaterally. Corneal responses are intact.  CN VII: Face is symmetric with normal eye closure and smile. CN VIII: Hearing is normal to rubbing fingers CN IX, X: Palate elevates symmetrically. Phonation is normal. CN XI: Head turning and shoulder shrug are intact CN XII: Tongue is midline with normal movements and no atrophy.  MOTOR: There is no pronator drift of out-stretched arms. Muscle bulk and tone are normal. Muscle strength is normal.  REFLEXES: Reflexes are hypoactive and symmetric at the biceps, triceps, knees, and ankles. Plantar responses are flexor.  SENSORY: Intact to light touch, pinprick, positional sensation and vibratory sensation are intact in fingers and toes.  COORDINATION: Rapid alternating movements and fine finger movements are intact. There is no dysmetria on finger-to-nose and  heel-knee-shin.    GAIT/STANCE: She needs pushed up to get up from seated position, antalgic, unsteady   DIAGNOSTIC DATA (LABS, IMAGING, TESTING) - I reviewed patient records, labs, notes, testing and imaging myself where available.   ASSESSMENT AND PLAN  NOE PITTSLEY is a 62 y.o.  female   Mild cognitive impairment  Concurrent with her worsening depression, health issues,  Elective left hip replacement surgery pending,  Proceed with MRI of brain  May consider ultrasound of carotid artery or echocardiogram if there is significant abnormality on MRI of the brain,  Encourage patient optimize control of her depression which might play a role in her complaints of memory loss,  Levert Feinstein, M.D. Ph.D.  [facility] [address] [place], [place] [address] Ph: [phone] Fax: [phone]  CC:  Gweneth Dimitri, MD

## 2018-01-07 DIAGNOSIS — R413 Other amnesia: Secondary | ICD-10-CM | POA: Insufficient documentation

## 2018-01-10 ENCOUNTER — Telehealth: Payer: Self-pay | Admitting: Neurology

## 2018-01-10 MED ORDER — ALPRAZOLAM 1 MG PO TABS: 1.0000 mg | ORAL_TABLET | Freq: Every evening | ORAL | 0 refills | Status: DC | PRN

## 2018-01-10 NOTE — Telephone Encounter (Signed)
Spoke to patient she wanted me to call her husband to schedule. She is scheduled for 01/18/18 at [facility]. He did say she is slightly claustrophobic and would like something to help her.

## 2018-01-10 NOTE — Telephone Encounter (Signed)
Pt returned Emily's call °

## 2018-01-10 NOTE — Telephone Encounter (Signed)
Spoke to patient she wanted me to call her husband to schedule. She is scheduled for 01/18/18 at [facility].

## 2018-01-10 NOTE — Telephone Encounter (Signed)
Xanax 1mg  as needed for MRI

## 2018-01-10 NOTE — Telephone Encounter (Signed)
lvm for pt to call back about scheduling mri  01/06/18 [employer] Auth: [id number] (exp. 01/06/18 to 02/04/18

## 2018-01-10 NOTE — Addendum Note (Signed)
Addended by: Levert Feinstein on: 01/10/2018 05:14 PM   Modules accepted: Orders

## 2018-01-11 ENCOUNTER — Other Ambulatory Visit: Payer: Self-pay | Admitting: *Deleted

## 2018-01-11 MED ORDER — ALPRAZOLAM 1 MG PO TABS: ORAL_TABLET | ORAL | 0 refills | Status: DC

## 2018-01-11 NOTE — Telephone Encounter (Signed)
Spoke to patient's husband on [place].  He is aware Xanax has been sent to the pharmacy.

## 2018-01-18 ENCOUNTER — Ambulatory Visit: Payer: BC Managed Care – PPO

## 2018-01-18 DIAGNOSIS — R413 Other amnesia: Secondary | ICD-10-CM

## 2018-01-20 ENCOUNTER — Telehealth: Payer: Self-pay | Admitting: *Deleted

## 2018-01-20 NOTE — Telephone Encounter (Signed)
Spoke to patient's husband, Shatima Zalar on [place].  He is aware of the normal MRI brain results and will notify his wife of this information.

## 2018-01-20 NOTE — Telephone Encounter (Signed)
-----   Message from Levert Feinstein, MD sent at 01/20/2018  8:48 AM EDT ----- Please call pt for normal MRI brain.

## 2018-02-15 ENCOUNTER — Telehealth: Payer: Self-pay | Admitting: *Deleted

## 2018-02-15 NOTE — Telephone Encounter (Signed)
1st attempt to reach pt: Left a detailed message with pt that we have received a surgical clearance from Dr. Stacy GardnerGraves's office and pt will need to be seen before clearance can be granted,and that she would need to call the office to schedule an appt.

## 2018-02-15 NOTE — Telephone Encounter (Signed)
   Primary Cardiologist:Traci Turner, MD  Chart reviewed as part of pre-operative protocol coverage. Because of Lauren Huffman's past medical history and time since last visit, he/she will require a follow-up visit in order to better assess preoperative cardiovascular risk.  Pre-op covering staff: - Please schedule appointment and call patient to inform them. - Please contact requesting surgeon's office via preferred method (i.e, phone, fax) to inform them of need for appointment prior to surgery.  [name], [place]  02/15/2018, 2:36 PM

## 2018-02-15 NOTE — Telephone Encounter (Signed)
   [facility] Pre-operative Risk Assessment    Request for surgical clearance:  1. What type of surgery is being performed? LEFT TOTAL HIP REPLACEMENT    2. When is this surgery scheduled? TBD    3. Are there any medications that need to be held prior to surgery and how long? PT IS NOT ON ANY ANTICOAGS   4. Practice name and name of physician performing surgery? [facility]; DR. Dorna Leitz.   5. What is your office phone and fax number? PH# [phone]; FAX# [phone]   6. Anesthesia type (None, local, MAC, general) ? SPINAL   Lauren Huffman 02/15/2018, 10:02 AM  _________________________________________________________________   (provider comments below)

## 2018-02-16 ENCOUNTER — Telehealth: Payer: Self-pay | Admitting: *Deleted

## 2018-02-16 NOTE — Telephone Encounter (Signed)
2nd Attempt to call pt to schedule appointment for surgical clearance.

## 2018-02-16 NOTE — Telephone Encounter (Signed)
2nd Attempt to call pt to schedule appt for surgical clearance.

## 2018-02-17 NOTE — Telephone Encounter (Signed)
3rd attempt to reach pt. Lauren Huffman

## 2018-02-18 NOTE — Telephone Encounter (Signed)
This is our 4th attempt to reach the pt to schedule an appt for her surgery clearance. I will call the surgeon's office to let them know we have attempted to reach the pt to schedule appt for pre op clearance. Per Dr. Luiz BlareGraves office surgery scheduler said to call the [phone]. I called this number as well and left message to call for appt.

## 2018-02-21 ENCOUNTER — Ambulatory Visit: Payer: BC Managed Care – PPO | Admitting: Nurse Practitioner

## 2018-02-21 ENCOUNTER — Encounter: Payer: Self-pay | Admitting: Nurse Practitioner

## 2018-02-21 VITALS — BP 108/70 | HR 83 | Ht 71.0 in | Wt 190.8 lb

## 2018-02-21 DIAGNOSIS — I471 Supraventricular tachycardia: Secondary | ICD-10-CM | POA: Diagnosis not present

## 2018-02-21 DIAGNOSIS — Z01810 Encounter for preprocedural cardiovascular examination: Secondary | ICD-10-CM

## 2018-02-21 NOTE — Progress Notes (Signed)
CARDIOLOGY OFFICE NOTE  Date:  02/21/2018    Sid Falcon Date of Birth: [birth year] Medical Record #[id number]  PCP:  Gweneth Dimitri, MD  Cardiologist:  Mayford Knife    Chief Complaint  Patient presents with  . Pre-op Exam    Seen for Dr. Mayford Knife    History of Present Illness: Lauren Huffman is a 62 y.o. female who presents today for a surgical clearance visit. Seen for Dr. Mayford Knife (patient preference).   She has a history of carpal tunnel, HTN, HLD, depression, anxiety, sleep apnea (unable to tolerate CPAP) & varicose veins s/p ablation. FH + for CAD.   Seen here a little over a year ago by Berton Bon, NP for the evaluation of SVT at the request of Dr. Rolland Porter. Pt stated that Dr. Fayrene Fearing had discussed the pt with Dr. Mayford Knife and she would like to be followed by Dr. Mayford Knife as she prefers a female provider.   She was in the ER last October 2018 with symptomatic palpitations. Found to be in SVT - treated with Adenosine and converted - then started on CCB. She reported prior side effects with Metoprolol. She has not been able to tolerate CPAP. Echo was basically normal. She declined event monitor due to cost.   Comes in today. Here with her daughter. She is in a wheelchair. Clearance has been asked for total hip replacement with Dr. Luiz Blare - this is for next week - not on any anticoagulation/antiplatelet therapy. She was not able to tolerate her CCB - made her depression worse. She has lost weight - significant amount (over 60#) due to her depression. She denies chest pain, shortness of breath, lightheaded or dizzy. She is basically pretty sedentary over the past 6 months. She could previously walk around the block - was active with housework/taking care of elderly mother - now cannot do those tasks due to pain/immobility. She notes she has been told her snoring is less. BP is controlled. No lipid therapy.   Past Medical History:  Diagnosis Date  . Aneurysm, lower extremity (HCC)   .  Anxiety   . Arthritis   . Carpal tunnel syndrome    Right  . Chronic pain   . Depression   . GERD (gastroesophageal reflux disease)   . Headache(784.0)   . Hiatal hernia   . History of colon polyps   . History of iron deficiency   . Hyperlipemia   . Hypertension   . Left arm pain   . Memory difficulty   . Mental disorder   . OSA (obstructive sleep apnea)   . Osteoarthritis   . Sleep apnea    DOES NOT WEAR CPAP    . Sleep disturbance   . Status post ablation of incompetent vein using laser 2x in Nov. 2017   right  . SVT (supraventricular tachycardia) (HCC)   . TMJ (dislocation of temporomandibular joint)   . Varicose veins   . Vitamin D insufficiency     Past Surgical History:  Procedure Laterality Date  . CYST REMOVAL HAND Left   . JOINT REPLACEMENT Right    knee  . KNEE ARTHROPLASTY  12/25/2011   Procedure: COMPUTER ASSISTED TOTAL KNEE ARTHROPLASTY;  Surgeon: [name], MD;  Location: [facility] OR;  Service: Orthopedics;  Laterality: Right;  Right total knee replacement, general anesthesia, femoral nerve block  . KNEE ARTHROPLASTY Left 07/08/2012   Procedure: COMPUTER ASSISTED TOTAL KNEE ARTHROPLASTY;  Surgeon: [name], MD;  [facility]  OR;  Service: Orthopedics;  Laterality: Left;  PRE OP FEMORAL NERVE BLOCK  . lazer vein surgery  2017   right leg  . TOTAL HIP ARTHROPLASTY Right 03/27/2016  . TOTAL HIP ARTHROPLASTY Right 03/27/2016   Procedure: TOTAL HIP ARTHROPLASTY ANTERIOR APPROACH;  Surgeon: [name], MD;  Location: [facility] OR;  Service: Orthopedics;  Laterality: Right;  . TOTAL KNEE ARTHROPLASTY Left 07/08/2012   [name]     Medications: Current Meds  Medication Sig  . Ascorbic Acid (VITAMIN C PO) Take 1-2 tablets by mouth daily. TAKES 1 DAILY 2 IF FEELS LIKE GETTING SICK  . buPROPion (WELLBUTRIN XL) 150 MG 24 hr tablet Take 450 mg by mouth daily.   . Ca Phosphate-Cholecalciferol (EQL CALCIUM GUMMIES PO) Take 2 tablets by mouth daily.  . Cholecalciferol  (VITAMIN D3) 25 MCG (1000 UT) CAPS Take by mouth.  . citalopram (CELEXA) 40 MG tablet Take 40 mg by mouth every morning.  . etodolac (LODINE) 400 MG tablet Take 400 mg by mouth daily.   . ferrous sulfate 325 (65 FE) MG tablet Take 325 mg by mouth daily with breakfast.  . folic acid (FOLVITE) 1 MG tablet Take 2 mg by mouth daily.   Marland Kitchen loratadine (CLARITIN) 10 MG tablet Take 10 mg by mouth daily.  . Multiple Vitamins-Minerals (MULTIVITAMIN GUMMIES ADULT PO) Take 2 tablets by mouth daily.  . naproxen sodium (ANAPROX) 220 MG tablet Take 220 mg by mouth 2 (two) times daily with a meal.  . omeprazole (PRILOSEC) 40 MG capsule TAKE 1 CAPSULE BY MOUTH TWICE DAILY  . Probiotic Product (ALIGN PO) Take 1 tablet by mouth daily.  . Simethicone (GAS-X PO) Take 1 tablet by mouth 2 (two) times daily.   . traZODone (DESYREL) 50 MG tablet Take 100 mg by mouth at bedtime.   . Vitamin D, Ergocalciferol, (DRISDOL) 1.25 MG (50000 UT) CAPS capsule Take 50,000 Units by mouth every 7 (seven) days.      Allergies: Allergies  Allergen Reactions  . Cartia Xt [Diltiazem Hcl Er Beads] Other (See Comments)    Increases depression  . Gabapentin Other (See Comments)    Weakness and muscle contractions  . Metoprolol Other (See Comments)    INCREASED DEPRESSION   . Sulfa Antibiotics     Family History    Social History: The patient  reports that she has never smoked. She has never used smokeless tobacco. She reports that she drank alcohol. She reports that she does not use drugs.   Family History: The patient's family history includes CAD in her mother; Cancer in her father; Heart disease in her father; Hyperlipidemia in her mother and sister; Memory loss in her mother; Seizures in her mother; Stroke in her mother; Supraventricular tachycardia in her sister.   Review of Systems: Please see the history of present illness.   Otherwise, the review of systems is positive for none.   All other systems are reviewed and  negative.   Physical Exam: VS:  BP 108/70 (BP Location: Left Arm, Patient Position: Sitting, Cuff Size: Normal)   Pulse 83   Ht 5\' 11"  (1.803 m)   Wt 190 lb 12.8 oz (86.5 kg)   BMI 26.61 kg/m  .  BMI Body mass index is 26.61 kg/m.  Wt Readings from Last 3 Encounters:  02/21/18 190 lb 12.8 oz (86.5 kg)  01/06/18 194 lb (88 kg)  01/21/17 229 lb 12.8 oz (104.2 kg)    General: Pleasant. Looks older than her stated age.  Alert and in no acute distress.   HEENT: Normal.  Neck: Supple, no JVD, carotid bruits, or masses noted.  Cardiac: Regular rate and rhythm. No murmurs, rubs, or gallops. No edema.  Respiratory:  Lungs are clear to auscultation bilaterally with normal work of breathing.  GI: Soft and nontender.  MS: No deformity or atrophy. Gait not tested.  Skin: Warm and dry. Color is normal.  Neuro:  Strength and sensation are intact and no gross focal deficits noted.  Psych: Alert, appropriate and with normal affect.   LABORATORY DATA:  EKG:  EKG is ordered today. This demonstrates NSR with incomplete RBBB - unchanged. Reviewed with Dr. Okey Dupre.   Lab Results  Component Value Date   WBC 10.4 01/19/2017   HGB 14.5 01/19/2017   HCT 44.0 01/19/2017   PLT 344 01/19/2017   GLUCOSE 92 01/19/2017   ALT 13 (L) 03/23/2016   AST 18 03/23/2016   NA 140 01/19/2017   K 4.3 01/19/2017   CL 107 01/19/2017   CREATININE 1.06 (H) 01/19/2017   BUN 21 (H) 01/19/2017   CO2 23 01/19/2017   TSH 2.792 01/19/2017   INR 0.96 03/23/2016        BNP (last 3 results) No results for input(s): BNP in the last 8760 hours.  ProBNP (last 3 results) No results for input(s): PROBNP in the last 8760 hours.   Other Studies Reviewed Today:  Echo Study Conclusions 01/2017  - Left ventricle: The cavity size was normal. Wall thickness was   increased in a pattern of mild LVH. Systolic function was normal.   The estimated ejection fraction was in the range of 55% to 60%.   Wall motion was normal;  there were no regional wall motion   abnormalities. Doppler parameters are consistent with abnormal   left ventricular relaxation (grade 1 diastolic dysfunction).   Doppler parameters are consistent with indeterminate ventricular   filling pressure. - Aortic valve: Transvalvular velocity was within the normal range.   There was no stenosis. There was no regurgitation. - Mitral valve: Transvalvular velocity was within the normal range.   There was no evidence for stenosis. There was mild regurgitation. - Left atrium: The atrium was mildly dilated. - Right ventricle: The cavity size was normal. Wall thickness was   normal. Systolic function was normal. - Atrial septum: No defect or patent foramen ovale was identified   by color flow Doppler. - Tricuspid valve: There was mild regurgitation. - Pulmonary arteries: Systolic pressure was at the upper limits of   normal. PA peak pressure: 39 mm Hg (S).   Assessment/Plan:  1. Pre op clearance - for total hip replacement - was able to complete 4 mets of activity up until 6 months ago - no active or worrisome cardiac symptoms at this time but has become more sedentary - discussed with Dr. Okey Dupre (DOD) - will let her proceed on with her planned surgery. Will be available as needed.   2. History of SVT - this has not recurred. No longer on CCB therapy.   3. RBBB - unchanged.   4. HTN - BP is great - she is on no treatment.   5. OSA - may no longer be an issue.   6. +FH for CAD - CV risk factor modification encouraged.   7. HLD - followed by PCP  Current medicines are reviewed with the patient today.  The patient does not have concerns regarding medicines other than what has been noted above.  The  following changes have been made:  See above.  Labs/ tests ordered today include:    Orders Placed This Encounter  Procedures  . EKG 12-Lead     Disposition:   FU with Dr. Mayford Knifeurner prn. I am happy to see back as needed.   Patient is  agreeable to this plan and will call if any problems develop in the interim.   SignedNorma Fredrickson: Mally Gavina, NP  02/21/2018 9:34 AM  [facility] [address] [place], [place]  [address] Phone: [phone]) [phone] Fax: [phone]

## 2018-02-21 NOTE — Patient Instructions (Addendum)
We will be checking the following labs today - NONE   Medication Instructions:    Continue with your current medicines.    If you need a refill on your cardiac medications before your next appointment, please call your pharmacy.     Testing/Procedures To Be Arranged:  N/A  Follow-Up:   See us back as needed. I am happy to see you back.     At [facility], you and your health needs are our priority.  As part of our continuing mission to provide you with exceptional heart care, we have created designated Provider Care Teams.  These Care Teams include your primary Cardiologist (physician) and Advanced Practice Providers (APPs -  Physician Assistants and Nurse Practitioners) who all work together to provide you with the care you need, when you need it.  Special Instructions:  . I will send a note to Dr. Luiz BlareGraves.   Call the [facility] office at [phone] if you have any questions, problems or concerns.

## 2018-02-21 NOTE — Telephone Encounter (Signed)
Patient seen in office today with Lockie MolaLori Gethardt.

## 2018-03-07 ENCOUNTER — Ambulatory Visit (HOSPITAL_COMMUNITY)
Admission: RE | Admit: 2018-03-07 | Discharge: 2018-03-07 | Disposition: A | Discharge status: Home / Self Care | Payer: BC Managed Care – PPO | Attending: Psychiatry | Admitting: Psychiatry

## 2018-03-07 DIAGNOSIS — Z888 Allergy status to other drugs, medicaments and biological substances status: Secondary | ICD-10-CM | POA: Diagnosis not present

## 2018-03-07 DIAGNOSIS — I10 Essential (primary) hypertension: Secondary | ICD-10-CM | POA: Diagnosis not present

## 2018-03-07 DIAGNOSIS — R413 Other amnesia: Secondary | ICD-10-CM | POA: Diagnosis not present

## 2018-03-07 DIAGNOSIS — M1990 Unspecified osteoarthritis, unspecified site: Secondary | ICD-10-CM | POA: Insufficient documentation

## 2018-03-07 DIAGNOSIS — Z818 Family history of other mental and behavioral disorders: Secondary | ICD-10-CM | POA: Diagnosis not present

## 2018-03-07 DIAGNOSIS — Z823 Family history of stroke: Secondary | ICD-10-CM | POA: Diagnosis not present

## 2018-03-07 DIAGNOSIS — G4733 Obstructive sleep apnea (adult) (pediatric): Secondary | ICD-10-CM | POA: Diagnosis not present

## 2018-03-07 DIAGNOSIS — E785 Hyperlipidemia, unspecified: Secondary | ICD-10-CM | POA: Insufficient documentation

## 2018-03-07 DIAGNOSIS — F331 Major depressive disorder, recurrent, moderate: Secondary | ICD-10-CM | POA: Diagnosis present

## 2018-03-07 DIAGNOSIS — Z8249 Family history of ischemic heart disease and other diseases of the circulatory system: Secondary | ICD-10-CM | POA: Insufficient documentation

## 2018-03-07 DIAGNOSIS — F419 Anxiety disorder, unspecified: Secondary | ICD-10-CM | POA: Insufficient documentation

## 2018-03-07 DIAGNOSIS — G8929 Other chronic pain: Secondary | ICD-10-CM | POA: Diagnosis not present

## 2018-03-07 DIAGNOSIS — E559 Vitamin D deficiency, unspecified: Secondary | ICD-10-CM | POA: Diagnosis not present

## 2018-03-07 DIAGNOSIS — K219 Gastro-esophageal reflux disease without esophagitis: Secondary | ICD-10-CM | POA: Insufficient documentation

## 2018-03-07 NOTE — BH Assessment (Signed)
Assessment Note  Lauren Huffman is a married 62 y.o. female who presents voluntarily to [facility] for a walk-in assessment. Pt is accompanied by her husband & daughter. Pt reports long hx of Depression with recent hypomanic sx that are new for her. Pt had hip surgery last week. Family reports manic sx began prior to surgery. Pt reports medication compliance. She states she is taking too many medications & does not feel her provider is listening well to what she wants. Pt is currently especially concerned with taking too much Trazadone & wants to start weaning herself off of it. Pt denies current suicidal ideation. She reports remembering suicidal ideation only once in her life when she was in her 62's, but she was able to identify a deterrent to self harm. She denies any past attempts to end her life. Pt acknowledges multiple symptoms of Depression including isolating, anhedonia, worthlessness, tearfulness, guilt, loss of appetite, reduced sleep & increased irritablity. Pt denies homicidal ideation/ history of violence. Pt denies AVH & other sx of psychosis. Pt states current stressors include recent hip surgery, long term depression, long-distance caring for several friends & family members.  Pt lives with her husband & daughter, and supports include the same. Pt reports there is a family history of Depression. 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 Pt has good insight and judgment. No legal history of problems reported. ? Pt's OP history includes Lauren Huffman at [facility]. IP history includes none. Pt denies alcohol/ substance abuse. ? MSE: Pt is casually dressed, alert, oriented x4 with slightly tangential speech and normal motor behavior. Eye contact is good. Pt's mood is pleasant & preoccupied (focused on certain things like "med wash") and affect is congruent with mood. Thought process is coherent and relevant. There is no indication pt is currently responding  to internal stimuli or experiencing delusional thought content. Pt was cooperative throughout assessment.   Disposition:  Lauren Huffman recommends pt follow up with an outpt provider at this time- either her current provider or new provider. [facility] outpt information given to pt at their request.  Diagnosis: F33.1 Major Depressive disorder, recurrent, moderate  Past Medical History:  Past Medical History:  Diagnosis Date  . Aneurysm, lower extremity (HCC)   . Anxiety   . Arthritis   . Carpal tunnel syndrome    Right  . Chronic pain   . Depression   . GERD (gastroesophageal reflux disease)   . Headache(784.0)   . Hiatal hernia   . History of colon polyps   . History of iron deficiency   . Hyperlipemia   . Hypertension   . Left arm pain   . Memory difficulty   . Mental disorder   . OSA (obstructive sleep apnea)   . Osteoarthritis   . Sleep apnea    DOES NOT WEAR CPAP    . Sleep disturbance   . Status post ablation of incompetent vein using laser 2x in Nov. 2017   right  . SVT (supraventricular tachycardia) (HCC)   . TMJ (dislocation of temporomandibular joint)   . Varicose veins   . Vitamin D insufficiency     Past Surgical History:  Procedure Laterality Date  . CYST REMOVAL HAND Left   . JOINT REPLACEMENT Right    knee  . KNEE ARTHROPLASTY  12/25/2011   Procedure: COMPUTER ASSISTED TOTAL KNEE ARTHROPLASTY;  Surgeon: Lauren Junior, MD;  Location: [facility] OR;  Service: Orthopedics;  Laterality: Right;  Right total knee replacement, general anesthesia, femoral nerve block  . KNEE ARTHROPLASTY Left 07/08/2012   Procedure: COMPUTER ASSISTED TOTAL KNEE ARTHROPLASTY;  Surgeon: Lauren Junior, MD;  Location: [facility] OR;  Service: Orthopedics;  Laterality: Left;  PRE OP FEMORAL NERVE BLOCK  . lazer vein surgery  2017   right leg  . TOTAL HIP ARTHROPLASTY Right 03/27/2016  . TOTAL HIP ARTHROPLASTY Right 03/27/2016   Procedure: TOTAL HIP ARTHROPLASTY ANTERIOR APPROACH;  Surgeon: Lauren Geralds,  MD;  Location: [facility] OR;  Service: Orthopedics;  Laterality: Right;  . TOTAL KNEE ARTHROPLASTY Left 07/08/2012   Dr Lauren Huffman    Family History:  Family History  Problem Relation Age of Onset  . Stroke Mother   . Seizures Mother   . Memory loss Mother   . CAD Mother        had a stent  . Hyperlipidemia Mother   . Cancer Father        Salivary Gland   . Heart disease Father        2 silent MI's  . Supraventricular tachycardia Sister   . Hyperlipidemia Sister     Social History:  reports that she has never smoked. She has never used smokeless tobacco. She reports that she drank alcohol. She reports that she does not use drugs.  Additional Social History:  Alcohol / Drug Use Pain Medications: See MAR Prescriptions: See MAR Over the Counter: See MAR History of alcohol / drug use?: No history of alcohol / drug abuse  CIWA: CIWA-Ar BP: (!) 109/55 Pulse Rate: 90 COWS:    Allergies:  Allergies  Allergen Reactions  . Cartia Xt [Diltiazem Hcl Er Beads] Other (See Comments)    Increases depression  . Gabapentin Other (See Comments)    Weakness and muscle contractions  . Metoprolol Other (See Comments)    INCREASED DEPRESSION   . Sulfa Antibiotics     Family History    Home Medications:  (Not in a hospital admission)  OB/GYN Status:  No LMP recorded. Patient is postmenopausal.  General Assessment Data Location of Assessment: [facility] Assessment Services TTS Assessment: In system Is this a Tele or Face-to-Face Assessment?: Face-to-Face Is this an Initial Assessment or a Re-assessment for this encounter?: Initial Assessment Patient Accompanied by:: Other(spouse & daughter) Language Other than English: No What gender do you identify as?: Female Marital status: Married Lauren Huffman Living Arrangements: Spouse/significant other, Children Can pt return to current living arrangement?: Yes Admission Status: Voluntary Is patient capable of signing voluntary admission?:  Yes Referral Source: Self/Family/Friend Insurance type: Scientist, research (physical sciences) Exam [facility] Walk-in ONLY) Medical Exam completed: Yes  Crisis Care Plan Living Arrangements: Spouse/significant other, Children Name of Psychiatrist: Robin Bridges/Dr. Hejazi  Education Status Is patient currently in school?: No Is the patient employed, unemployed or receiving disability?: (retired Orthoptist)  Risk to self with the past 6 months Suicidal Ideation: No Has patient been a risk to self within the past 6 months prior to admission? : No Suicidal Intent: No Has patient had any suicidal intent within the past 6 months prior to admission? : No Is patient at risk for suicide?: No Suicidal Plan?: No Has patient had any suicidal plan within the past 6 months prior to admission? : No Previous Attempts/Gestures: No Intentional Self Injurious Behavior: None Family Suicide History: Yes Recent stressful life event(s): Other (Comment), Recent negative physical changes, Turmoil (Comment)(hip surgery; traveling to care for others) Persecutory voices/beliefs?: No Depression: Yes Depression Symptoms: Despondent, Insomnia, Tearfulness,  Isolating, Fatigue, Guilt, Loss of interest in usual pleasures, Feeling worthless/self pity, Feeling angry/irritable Substance abuse history and/or treatment for substance abuse?: No Suicide prevention information given to non-admitted patients: Not applicable  Risk to Others within the past 6 months Homicidal Ideation: No Does patient have any lifetime risk of violence toward others beyond the six months prior to admission? : No Thoughts of Harm to Others: No Current Homicidal Intent: No Current Homicidal Plan: No Access to Homicidal Means: No History of harm to others?: No Assessment of Violence: None Noted Does patient have access to weapons?: Yes (Comment)(per spouse "only a 22" but states out of pt's access) Criminal Charges Pending?: No Does patient have a court  date: No Is patient on probation?: No  Psychosis Hallucinations: None noted Delusions: None noted  Mental Status Report Appearance/Hygiene: Unremarkable Eye Contact: Good Motor Activity: Freedom of movement(minus wheel chair due to recent hip surgery) Speech: Logical/coherent, Tangential Level of Consciousness: Alert, Quiet/awake Mood: Pleasant, Preoccupied Affect: Preoccupied Anxiety Level: Minimal Thought Processes: Tangential, Irrelevant, Relevant, Coherent Judgement: Partial Orientation: Person, Place, Time, Situation Obsessive Compulsive Thoughts/Behaviors: None  Cognitive Functioning Concentration: Decreased Memory: Unable to Assess Is patient IDD: No Insight: Good Impulse Control: Fair Appetite: Fair Have you had any weight changes? : Loss Amount of the weight change? (lbs): 70 lbs(in 2 years- unintentional) Sleep: Decreased Total Hours of Sleep: 4 Vegetative Symptoms: None  ADLScreening [facility] Assessment Services) Patient's cognitive ability adequate to safely complete daily activities?: Yes Patient able to express need for assistance with ADLs?: Yes Independently performs ADLs?: No(while recovering from hip surgery)  Prior Inpatient Therapy Prior Inpatient Therapy: No  Prior Outpatient Therapy Prior Outpatient Therapy: Yes Prior Therapy Dates: ongoing Prior Therapy Facilty/Provider(s): [facility] Reason for Treatment: Depression Does patient have an ACCT team?: No Does patient have Intensive In-House Services?  : No Does patient have [facility] services? : No Does patient have P4CC services?: No  ADL Screening (condition at time of admission) Patient's cognitive ability adequate to safely complete daily activities?: Yes Is the patient deaf or have difficulty hearing?: No Does the patient have difficulty seeing, even when wearing glasses/contacts?: No Does the patient have difficulty concentrating, remembering, or making decisions?: No Patient able  to express need for assistance with ADLs?: Yes Does the patient have difficulty dressing or bathing?: Yes Independently performs ADLs?: No(while recovering from hip surgery) Does the patient have difficulty walking or climbing stairs?: Yes       Abuse/Neglect Assessment (Assessment to be complete while patient is alone) Abuse/Neglect Assessment Can Be Completed: Yes Physical Abuse: Denies Verbal Abuse: Denies Sexual Abuse: Denies Exploitation of patient/patient's resources: Denies Self-Neglect: Denies Values / Beliefs Cultural Requests During Hospitalization: None Spiritual Requests During Hospitalization: None Consults Spiritual Care Consult Needed: No Social Work Consult Needed: No Merchant navy officerAdvance Directives (For Healthcare) Does Patient Have a Medical Advance Directive?: No Would patient like information on creating a medical advance directive?: No - Patient declined          Disposition:  Disposition Initial Assessment Completed for this Encounter: Yes Disposition of Patient: Discharge Patient refused recommended treatment: No  On Site Evaluation by:   Reviewed with Physician:    Clearnce Sorreleirdre H Olevia Westervelt 03/07/2018 11:01 AM

## 2018-03-07 NOTE — H&P (Signed)
Behavioral Health Medical Screening Exam  Lauren FalconJan M Wickard is an 62 y.o. female who presents with her husband as a walk in due to depressive symptoms. Patient states "I feel I am on too many medications. The Trazodone is not working for sleep. I do not want to hurt myself or others. I would like a new outpatient Provider." The patient does not meet criteria for inpatient psychiatric admission. Patient requesting referrals for other outpatient psychiatric Providers. The family was provided with [facility].   Total Time spent with patient: 20 minutes  Psychiatric Specialty Exam: Physical Exam  ROS  Blood pressure (!) 109/55, pulse 90, temperature 98.3 F (36.8 C), SpO2 100 %.There is no height or weight on file to calculate BMI.  General Appearance: Casual  Eye Contact:  Good  Speech:  Clear and Coherent  Volume:  Normal  Mood:  Anxious  Affect:  Appropriate  Thought Process:  Coherent and Goal Directed  Orientation:  Full (Time, Place, and Person)  Thought Content:  WDL  Suicidal Thoughts:  No  Homicidal Thoughts:  No  Memory:  Immediate;   Good Recent;   Good Remote;   Good  Judgement:  Fair  Insight:  Fair  Psychomotor Activity:  Normal  Concentration: Concentration: Good and Attention Span: Good  Recall:  Good  Fund of Knowledge:Good  Language: Good  Akathisia:  No  Handed:  Right  AIMS (if indicated):     Assets:  Communication Skills Desire for Improvement Financial Resources/Insurance Housing Intimacy Leisure Time Physical Health Resilience Social Support  Sleep:       Musculoskeletal: Strength & Muscle Tone: within normal limits Gait & Station: Currently in wheelchair due to total hip replacement 03/02/2018  Patient leans: N/A  Blood pressure (!) 109/55, pulse 90, temperature 98.3 F (36.8 C), SpO2 100 %.  Recommendations:  Based on my evaluation the patient does not appear to have an emergency medical condition.  Fransisca KaufmannAVIS,  Kacy Conely, NP 03/07/2018, 10:27 AM

## 2018-03-08 ENCOUNTER — Telehealth (HOSPITAL_COMMUNITY): Payer: Self-pay | Admitting: Professional

## 2018-03-08 NOTE — Telephone Encounter (Signed)
Cln called pt to f/u on referral from inpt. Pt was ax yesterday for possible manic symptoms and pt reports "the lady that assessed me doesn't think it's mania and I do not either." Pt reports she has been dx with MDD since 24 and has been in a depressive state for the last 3 years. Pt denies any SI/HI/AVH. Pt states "I need group therapy, either IOP or PHP, a chaplin, an individual counselor, a Nutritionalist, because I have lost 70 lbs and I want to learn to be healthy, and to do art therapy. I will continue to do physical therapy once a week, too. I can do art therapy with [facility]elind [facility]isa [facility] if I need. I need to let you know that I am retired from the [employer] at [facility] and The[facility] was my supervisor. He can know everything he needs to know. I texted him yesterday and told him that I would be starting a group and he said, 'great, I run one of them.'" Cln told pt she would see a chaplin 1x a week in either PHP or IOP, that individual therapy would follow completion of group therapy, that neither group provides nutritionalist, and that art therapy is intertwined in both groups. Pt reports understanding. Pt reports she wants to speak to someone about how things were run at inpt yesterday  " because I was very unhappy and I have some suggestions. So what is their number?" Cln provided number [phone]). Pt asked who to speak with and cln stated, "I don't know for inpt. I would call and report you want to make a complaint and ask who to speak with." Pt agreed she would do that.  Cln tried to schedule ax for 12/9 but pt wants ax after 12/11 due to not being cleared by doctor from recent surgery until 12/11. Pt provided with ax on 12/12 @ 2:30; told to arrive at 1:30. Pt asked for a later time due to husband's psychiatrist appt @ 2. Cln told pt that is the ax time each day but could move the appt to 12/16. Pt declined and reported they would cancel her husband's appt. Pt states she wants  daughter and husband involved in 52ax. Cln told pt it would be up to her if they were present during the ax. Pt understood. Cln provided crisis hotline number in case needs.

## 2018-03-10 ENCOUNTER — Telehealth (HOSPITAL_COMMUNITY): Payer: Self-pay | Admitting: Professional

## 2018-03-10 NOTE — Telephone Encounter (Signed)
Cln returned pt call. Cln told pt she would need to request the notes from [facility] because an ROI would need to be signed. Pt told she could have notes faxed (provided fax number [phone]) or could bring them to the office. Pt asked if she could bring them to her apt on the 12th and cln agreed. Pt continued to call Hillery Jacksanika Lewis "Dr. Melvyn NethLewis" so cln corrected her that T. Lewis is an NP. Pt understood and asked what to call T. Lewis; cln responded "She goes by RadioShackanika." Pt apologized "for making it seem like I wanted you to push Tanika about my sister coming with me the other day. I was in a winters nap from Mother's Day [facility] and then my brain decided to come back on but my filters haven't come back. I'm usually not this pushy with my health care providers" Cln told pt the apology was not necessary and that it sounded like she was just advocating for her care. Pt understood. Pt discussed she wants additional art therapy and Chaplin services and could get this from [facility]el[facility]isa [facility]onden [facility]. Cln told pt art therapy is weaved into both groups, and may not be done daily, and that each group meets with a Chaplin 1x a week as a group. Cln shared that if pt wants additional services beyond these, she could work this out with Misty StanleyLisa and Nadine CountsBob. Cln told pt she would need to be aware that insurance will typically not pay for a group service and additional counseling/psychiatry services on the same day because it is seem as "double treatment". Pt understood. Pt reports she will write down any more questions that come to mind and bring them with her to the appt on the 12th. Counselor agreed.

## 2018-03-13 ENCOUNTER — Encounter (HOSPITAL_COMMUNITY): Payer: Self-pay | Admitting: Pharmacy Technician

## 2018-03-13 ENCOUNTER — Emergency Department (HOSPITAL_BASED_OUTPATIENT_CLINIC_OR_DEPARTMENT_OTHER): Payer: BC Managed Care – PPO

## 2018-03-13 ENCOUNTER — Emergency Department (HOSPITAL_COMMUNITY)
Admission: EM | Admit: 2018-03-13 | Discharge: 2018-03-14 | Disposition: A | Discharge status: Home / Self Care | Payer: BC Managed Care – PPO | Attending: Family Medicine | Admitting: Family Medicine

## 2018-03-13 ENCOUNTER — Emergency Department (HOSPITAL_COMMUNITY): Payer: BC Managed Care – PPO

## 2018-03-13 ENCOUNTER — Other Ambulatory Visit: Payer: Self-pay

## 2018-03-13 DIAGNOSIS — G4700 Insomnia, unspecified: Secondary | ICD-10-CM | POA: Diagnosis present

## 2018-03-13 DIAGNOSIS — R441 Visual hallucinations: Secondary | ICD-10-CM | POA: Diagnosis present

## 2018-03-13 DIAGNOSIS — I10 Essential (primary) hypertension: Secondary | ICD-10-CM | POA: Diagnosis not present

## 2018-03-13 DIAGNOSIS — R440 Auditory hallucinations: Secondary | ICD-10-CM | POA: Diagnosis not present

## 2018-03-13 DIAGNOSIS — F329 Major depressive disorder, single episode, unspecified: Secondary | ICD-10-CM | POA: Insufficient documentation

## 2018-03-13 DIAGNOSIS — R609 Edema, unspecified: Secondary | ICD-10-CM | POA: Diagnosis not present

## 2018-03-13 DIAGNOSIS — Z96642 Presence of left artificial hip joint: Secondary | ICD-10-CM

## 2018-03-13 DIAGNOSIS — F5101 Primary insomnia: Secondary | ICD-10-CM | POA: Diagnosis not present

## 2018-03-13 DIAGNOSIS — R600 Localized edema: Secondary | ICD-10-CM | POA: Insufficient documentation

## 2018-03-13 DIAGNOSIS — Z79899 Other long term (current) drug therapy: Secondary | ICD-10-CM | POA: Insufficient documentation

## 2018-03-13 DIAGNOSIS — R339 Retention of urine, unspecified: Secondary | ICD-10-CM | POA: Insufficient documentation

## 2018-03-13 LAB — CBC WITH DIFFERENTIAL/PLATELET
Abs Immature Granulocytes: 0.06 10*3/uL (ref 0.00–0.07)
BASOS PCT: 0 %
Basophils Absolute: 0 10*3/uL (ref 0.0–0.1)
Eosinophils Absolute: 0.2 10*3/uL (ref 0.0–0.5)
Eosinophils Relative: 2 %
HCT: 31.4 % — ABNORMAL LOW (ref 36.0–46.0)
Hemoglobin: 10 g/dL — ABNORMAL LOW (ref 12.0–15.0)
Immature Granulocytes: 1 %
Lymphocytes Relative: 18 %
Lymphs Abs: 1.7 10*3/uL (ref 0.7–4.0)
MCH: 30.1 pg (ref 26.0–34.0)
MCHC: 31.8 g/dL (ref 30.0–36.0)
MCV: 94.6 fL (ref 80.0–100.0)
MONO ABS: 0.7 10*3/uL (ref 0.1–1.0)
MONOS PCT: 7 %
Neutro Abs: 7 10*3/uL (ref 1.7–7.7)
Neutrophils Relative %: 72 %
PLATELETS: 313 10*3/uL (ref 150–400)
RBC: 3.32 MIL/uL — ABNORMAL LOW (ref 3.87–5.11)
RDW: 12.9 % (ref 11.5–15.5)
WBC: 9.6 10*3/uL (ref 4.0–10.5)
nRBC: 0 % (ref 0.0–0.2)

## 2018-03-13 LAB — COMPREHENSIVE METABOLIC PANEL
ALK PHOS: 89 U/L (ref 38–126)
ALT: 20 U/L (ref 0–44)
AST: 20 U/L (ref 15–41)
Albumin: 2.9 g/dL — ABNORMAL LOW (ref 3.5–5.0)
Anion gap: 10 (ref 5–15)
BILIRUBIN TOTAL: 0.6 mg/dL (ref 0.3–1.2)
BUN: 9 mg/dL (ref 8–23)
CALCIUM: 8.5 mg/dL — AB (ref 8.9–10.3)
CO2: 26 mmol/L (ref 22–32)
CREATININE: 0.74 mg/dL (ref 0.44–1.00)
Chloride: 105 mmol/L (ref 98–111)
GFR calc Af Amer: >60 mL/min (ref >60)
Glucose, Bld: 130 mg/dL — ABNORMAL HIGH (ref 70–99)
Potassium: 3.5 mmol/L (ref 3.5–5.1)
Sodium: 141 mmol/L (ref 135–145)
Total Protein: 5.7 g/dL — ABNORMAL LOW (ref 6.5–8.1)

## 2018-03-13 LAB — ACETAMINOPHEN LEVEL

## 2018-03-13 LAB — ETHANOL

## 2018-03-13 LAB — SALICYLATE LEVEL: Salicylate Lvl: <7 mg/dL (ref 2.8–30.0)

## 2018-03-13 MED ORDER — METHOCARBAMOL 500 MG PO TABS
750.0000 mg | ORAL_TABLET | Freq: Three times a day (TID) | ORAL | Status: DC
  Administered 2018-03-14 (×2): 750 mg via ORAL
  Filled 2018-03-13 (×2): qty 2

## 2018-03-13 MED ORDER — CITALOPRAM HYDROBROMIDE 10 MG PO TABS
40.0000 mg | ORAL_TABLET | Freq: Every morning | ORAL | Status: DC
  Administered 2018-03-14: 40 mg via ORAL
  Filled 2018-03-13: qty 4

## 2018-03-13 MED ORDER — PANTOPRAZOLE SODIUM 40 MG PO TBEC
40.0000 mg | DELAYED_RELEASE_TABLET | Freq: Every day | ORAL | Status: DC
  Administered 2018-03-14: 40 mg via ORAL
  Filled 2018-03-13: qty 1

## 2018-03-13 MED ORDER — LORATADINE 10 MG PO TABS
10.0000 mg | ORAL_TABLET | Freq: Every day | ORAL | Status: DC
  Administered 2018-03-14: 10 mg via ORAL
  Filled 2018-03-13: qty 1

## 2018-03-13 MED ORDER — ETODOLAC 400 MG PO TABS
400.0000 mg | ORAL_TABLET | Freq: Every day | ORAL | Status: DC
  Filled 2018-03-13: qty 1

## 2018-03-13 MED ORDER — LORAZEPAM 1 MG PO TABS
2.0000 mg | ORAL_TABLET | Freq: Once | ORAL | Status: AC
  Administered 2018-03-14: 2 mg via ORAL
  Filled 2018-03-13: qty 2

## 2018-03-13 MED ORDER — FOLIC ACID 1 MG PO TABS
2.0000 mg | ORAL_TABLET | Freq: Every day | ORAL | Status: DC
  Administered 2018-03-14: 2 mg via ORAL
  Filled 2018-03-13: qty 2

## 2018-03-13 MED ORDER — TRAMADOL HCL 50 MG PO TABS
50.0000 mg | ORAL_TABLET | Freq: Three times a day (TID) | ORAL | Status: DC
  Administered 2018-03-14 (×3): 50 mg via ORAL
  Filled 2018-03-13 (×3): qty 1

## 2018-03-13 MED ORDER — VITAMIN D (ERGOCALCIFEROL) 1.25 MG (50000 UNIT) PO CAPS: 50000.0000 [IU] | ORAL_CAPSULE | ORAL | Status: DC

## 2018-03-13 MED ORDER — METHOCARBAMOL 500 MG PO TABS
500.0000 mg | ORAL_TABLET | Freq: Once | ORAL | Status: AC
  Administered 2018-03-13: 500 mg via ORAL
  Filled 2018-03-13: qty 1

## 2018-03-13 MED ORDER — BUPROPION HCL ER (XL) 150 MG PO TB24
450.0000 mg | ORAL_TABLET | Freq: Every day | ORAL | Status: DC
  Administered 2018-03-14: 450 mg via ORAL
  Filled 2018-03-13: qty 3

## 2018-03-13 MED ORDER — TRAZODONE HCL 50 MG PO TABS: 75.0000 mg | ORAL_TABLET | Freq: Every day | ORAL | Status: DC

## 2018-03-13 NOTE — ED Notes (Signed)
Pt husband states medication list is wrong. Daughter to go get medication list.

## 2018-03-13 NOTE — ED Notes (Signed)
Have spoken with pt and family X2 per request. Unable to reason with pt, pt is highly verbally aggressive with staff, unable to reason with her. The first time she wanted to speak with me because of her medications, pt states she has not been giving any... Pt was given pain medicine, all her other meds "are not right" per pt and daughter is going to get the correct list from home... That is why the other meds have not been given. Pt also upset about having to be moved to [facility], pt is refusing. Pt states "the doctor told me I can stay back here so I am" I informed pt its not up to the doctor to make that call, it is up to me. I told pt that the area she is currently in shuts down at 0300. Her two options are to be placed in [facility] with a quiet room or she can go to a progressive bed. Also reviewed rules with pt and her husband. Explained visiting hours, phone calls, meals, etc. Pt requesting to change into scrubs when her daughter returns, informed her that is OK but once daughter returns she must change, be wanded and moved to [facility]. Pt agrees for now.

## 2018-03-13 NOTE — Progress Notes (Signed)
VASCULAR LAB PRELIMINARY  PRELIMINARY  PRELIMINARY  PRELIMINARY  Bilateral lower extremity venous duplex completed.    Preliminary report:  There is no obvious evidence of DVT or SVT noted in the bilateral lower extremities.  Significant interstitial fluid noted throughout calves.  Gave results to SPX CorporationMichael Maczis PA-C  Cristiano Capri, RVT 03/13/2018, 7:41 PM

## 2018-03-13 NOTE — ED Notes (Signed)
Advised patients daughter she could not be in the hallway due to report being given by EMS in the hallway (Pt Confidentiality). Daughter stormed off.

## 2018-03-13 NOTE — ED Provider Notes (Signed)
[facility] EMERGENCY DEPARTMENT Provider Note   CSN: [id number] Arrival date & time: 03/13/18  1802     History   Chief Complaint No chief complaint on file.   HPI Lauren Huffman is a 62 y.o. female was extensive past medical history as below who presents emergency department today for medical clearance.  Patient's family at bedside and supportive.  Patient reports she is "having a psychotic break".  She reports that she has suffered with depression since her mid 67s.  She reports that she does not think the trazodone is working for her she has been on for the last 2 years.  Patient reports that she has been having increasing depression recently.  Patient husband reports that the patient started having visual hallucination of ancestors that have since been deceased as well as auditory hallucinations telling her to do things.  Patient reports she has had decreased amount, of sleep recently only sleeping 2 hours per night.  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  She denies any SI/HI.  She denies any alcohol or drug use.  Patient does report that she has been having increasing pain in her left hip as well as swelling of her left lower extremity status post a hip replacement by Dr. Berenice Primas the day after Thanksgiving.  She reports she just saw the mid-level for follow-up few days ago and had reassuring x-rays.  Patient reports some increased incontinence and urinary symptoms.  She denies any fevers, chills, chest pain, shortness of breath, cough, hemoptysis, or other physical symptoms at this time.  HPI  Past Medical History:  Diagnosis Date  . Aneurysm, lower extremity ([place])   . Anxiety   . Arthritis   . Carpal tunnel syndrome    Right  . Chronic pain   . Depression   . GERD (gastroesophageal reflux disease)   . Headache(784.0)   . Hiatal hernia   . History of colon polyps   . History of iron deficiency   . Hyperlipemia   . Hypertension   . Left arm  pain   . Memory difficulty   . Mental disorder   . OSA (obstructive sleep apnea)   . Osteoarthritis   . Sleep apnea    DOES NOT WEAR CPAP    . Sleep disturbance   . Status post ablation of incompetent vein using laser 2x in Nov. 2017   right  . SVT (supraventricular tachycardia) ([place])   . TMJ (dislocation of temporomandibular joint)   . Varicose veins   . Vitamin D insufficiency     Patient Active Problem List   Diagnosis Date Noted  . Memory loss 01/07/2018  . Primary osteoarthritis of right hip 03/27/2016  . CTS (carpal tunnel syndrome) 09/17/2015  . Left elbow pain 06/20/2015  . Paresthesia 06/20/2015  . Osteoarthritis of left knee 07/08/2012  . Obesity 12/28/2011  . Osteoarthritis of right knee 12/25/2011    Past Surgical History:  Procedure Laterality Date  . CYST REMOVAL HAND Left   . JOINT REPLACEMENT Right    knee  . KNEE ARTHROPLASTY  12/25/2011   Procedure: COMPUTER ASSISTED TOTAL KNEE ARTHROPLASTY;  Surgeon: Alta Corning, MD;  Location: [facility];  Service: Orthopedics;  Laterality: Right;  Right total knee replacement, general anesthesia, femoral nerve block  . KNEE ARTHROPLASTY Left 07/08/2012   Procedure: COMPUTER ASSISTED TOTAL KNEE ARTHROPLASTY;  Surgeon: Alta Corning, MD;  Location: [facility];  Service: Orthopedics;  Laterality: Left;  PRE OP FEMORAL NERVE BLOCK  . lazer vein surgery  2017   right leg  . TOTAL HIP ARTHROPLASTY Right 03/27/2016  . TOTAL HIP ARTHROPLASTY Right 03/27/2016   Procedure: TOTAL HIP ARTHROPLASTY ANTERIOR APPROACH;  Surgeon: Dorna Leitz, MD;  Location: [facility];  Service: Orthopedics;  Laterality: Right;  . TOTAL KNEE ARTHROPLASTY Left 07/08/2012   Dr Berenice Primas     OB History   None      Home Medications    Prior to Admission medications   Medication Sig Start Date End Date Taking? Authorizing Provider  Ascorbic Acid (VITAMIN C PO) Take 1-2 tablets by mouth daily. TAKES 1 DAILY 2 IF FEELS LIKE GETTING SICK    [provider]    buPROPion (WELLBUTRIN XL) 150 MG 24 hr tablet Take 450 mg by mouth daily.     [provider]  Ca Phosphate-Cholecalciferol (EQL CALCIUM GUMMIES PO) Take 2 tablets by mouth daily.    [provider]  Cholecalciferol (VITAMIN D3) 25 MCG (1000 UT) CAPS Take by mouth.    [provider]  citalopram (CELEXA) 40 MG tablet Take 40 mg by mouth every morning.    [provider]  etodolac (LODINE) 400 MG tablet Take 400 mg by mouth daily.  02/08/18   [provider]  ferrous sulfate 325 (65 FE) MG tablet Take 325 mg by mouth daily with breakfast.    [provider]  folic acid (FOLVITE) 1 MG tablet Take 2 mg by mouth daily.     [provider]  loratadine (CLARITIN) 10 MG tablet Take 10 mg by mouth daily.    [provider]  Multiple Vitamins-Minerals (MULTIVITAMIN GUMMIES ADULT PO) Take 2 tablets by mouth daily.    [provider]  naproxen sodium (ANAPROX) 220 MG tablet Take 220 mg by mouth 2 (two) times daily with a meal.    [provider]  omeprazole (PRILOSEC) 40 MG capsule TAKE 1 CAPSULE BY MOUTH TWICE DAILY 02/26/15   [provider]  Probiotic Product (ALIGN PO) Take 1 tablet by mouth daily.    [provider]  Simethicone (GAS-X PO) Take 1 tablet by mouth 2 (two) times daily.     [provider]  traZODone (DESYREL) 50 MG tablet Take 100 mg by mouth at bedtime.     [provider]  Vitamin D, Ergocalciferol, (DRISDOL) 1.25 MG (50000 UT) CAPS capsule Take 50,000 Units by mouth every 7 (seven) days.  01/09/18   [provider]    Family History Family History  Problem Relation Age of Onset  . Stroke Mother   . Seizures Mother   . Memory loss Mother   . CAD Mother        had a stent  . Hyperlipidemia Mother   . Cancer Father        Salivary Gland   . Heart disease Father        2 silent MI's  . Supraventricular tachycardia Sister   . Hyperlipidemia Sister      Social History Social History   Tobacco Use  . Smoking status: Never Smoker  . Smokeless tobacco: Never Used  Substance Use Topics  . Alcohol use: Not Currently  . Drug use: No     Allergies   Cartia xt [diltiazem hcl er beads]; Gabapentin; Metoprolol; and Sulfa antibiotics   Review of Systems Review of Systems  All other systems reviewed and are negative.    Physical Exam Updated Vital Signs  BP (!) 154/97   Pulse 92   Temp 98.4 F (36.9 C)   Resp 18   Ht 5' 11"  (1.803 m)   Wt 86.5 kg   SpO2 97%   BMI 26.60 kg/m   Physical Exam  Constitutional: She appears well-developed and well-nourished.  HENT:  Head: Normocephalic and atraumatic.  Right Ear: External ear normal.  Left Ear: External ear normal.  Nose: Nose normal.  Mouth/Throat: Uvula is midline, oropharynx is clear and moist and mucous membranes are normal. No tonsillar exudate.  Eyes: Pupils are equal, round, and reactive to light. Right eye exhibits no discharge. Left eye exhibits no discharge. No scleral icterus.  Neck: Trachea normal. Neck supple. No spinous process tenderness present. No neck rigidity. Normal range of motion present.  Cardiovascular: Normal rate, regular rhythm and intact distal pulses.  No murmur heard. Pulses:      Radial pulses are 2+ on the right side, and 2+ on the left side.       Dorsalis pedis pulses are 2+ on the right side, and 2+ on the left side.       Posterior tibial pulses are 2+ on the right side.  Bilateral lower extremity edema.  Left leg is slightly more edematous than right.  Dorsalis pedis pulse intact with Doppler of the left lower extremity.  Pulmonary/Chest: Effort normal and breath sounds normal. She exhibits no tenderness.  Abdominal: Soft. Bowel sounds are normal. There is no tenderness. There is no rebound and no guarding.  Musculoskeletal: She exhibits no edema.  Additional site over left anterior hip is without evidence of infection.  No erythema,  heat, induration, fluctuance, drainage.  No dehiscence of wound. Left hip is without erythema, edema or joint swelling.  She has normal active range of motion.  Patient is able to stand and ambulate.  Compartments are soft of the lower extremities.  Lymphadenopathy:    She has no cervical adenopathy.  Neurological: She is alert.  Skin: Skin is warm and dry. No rash noted. She is not diaphoretic. No erythema.  Psychiatric: She has a normal mood and affect.  Nursing note and vitals reviewed.    ED Treatments / Results  Labs (all labs ordered are listed, but only abnormal results are displayed) Labs Reviewed  COMPREHENSIVE METABOLIC PANEL - Abnormal; Notable for the following components:      Result Value   Glucose, Bld 130 (*)    Calcium 8.5 (*)    Total Protein 5.7 (*)    Albumin 2.9 (*)    All other components within normal limits  ACETAMINOPHEN LEVEL - Abnormal; Notable for the following components:   Acetaminophen (Tylenol), Serum <10 (*)    All other components within normal limits  CBC WITH DIFFERENTIAL/PLATELET - Abnormal; Notable for the following components:   RBC 3.32 (*)    Hemoglobin 10.0 (*)    HCT 31.4 (*)    All other components within normal limits  SALICYLATE LEVEL  ETHANOL  URINALYSIS, ROUTINE W REFLEX MICROSCOPIC  RAPID URINE DRUG SCREEN, HOSP PERFORMED    EKG None  Radiology Vas [place] Lower Extremity Venous (dvt) (mc And Wl 7a-7p)  Result Date: 03/13/2018  Lower Venous Study Indications: Edema. Other Indications: Recent left total hip replacement. Limitations: Edema and patient's inability to straighten left leg. Comparison Study: Prior negative study done 07/21/12 is available for                   comparison. Performing Technologist: Sharion Dove  RVS  Examination Guidelines: A complete evaluation includes B-mode imaging, spectral Doppler, color Doppler, and power Doppler as needed of all accessible portions of each vessel. Bilateral testing is considered an  integral part of a complete examination. Limited examinations for reoccurring indications may be performed as noted.  Right Venous Findings: +---------+---------------+---------+-----------+----------+-------+          CompressibilityPhasicitySpontaneityPropertiesSummary +---------+---------------+---------+-----------+----------+-------+ CFV      Full           Yes      Yes                          +---------+---------------+---------+-----------+----------+-------+ SFJ      Full                                                 +---------+---------------+---------+-----------+----------+-------+ FV Prox  Full                                                 +---------+---------------+---------+-----------+----------+-------+ FV Mid   Full                                                 +---------+---------------+---------+-----------+----------+-------+ FV DistalFull                                                 +---------+---------------+---------+-----------+----------+-------+ PFV      Full                                                 +---------+---------------+---------+-----------+----------+-------+ POP      Full           Yes      Yes                          +---------+---------------+---------+-----------+----------+-------+ PTV      Full                                                 +---------+---------------+---------+-----------+----------+-------+  Right Technical Findings: Not visualized segments include peroneal vein.  Left Venous Findings: +---------+---------------+---------+-----------+----------+-------+          CompressibilityPhasicitySpontaneityPropertiesSummary +---------+---------------+---------+-----------+----------+-------+ CFV      Full           Yes      Yes                          +---------+---------------+---------+-----------+----------+-------+ SFJ      Full                                                  +---------+---------------+---------+-----------+----------+-------+  FV Prox  Full                                                 +---------+---------------+---------+-----------+----------+-------+ FV Mid   Full                                                 +---------+---------------+---------+-----------+----------+-------+ FV DistalFull                                                 +---------+---------------+---------+-----------+----------+-------+ PFV      Full                                                 +---------+---------------+---------+-----------+----------+-------+ POP      Full           Yes      Yes                          +---------+---------------+---------+-----------+----------+-------+ PTV      Full                                                 +---------+---------------+---------+-----------+----------+-------+  Left Technical Findings: Not visualized segments include peroneal vein.   Summary: Right: There is no evidence of deep vein thrombosis in the lower extremity. However, portions of this examination were limited- see technologist comments above. Interstitial fluid noted throughout the calf Left: There is no evidence of deep vein thrombosis in the lower extremity. However, portions of this examination were limited- see technologist comments above. Interstitial fluid noted throughout the popliteal fossa and calf  *See table(s) above for measurements and observations.    Preliminary     Procedures Procedures (including critical care time)  Medications Ordered in ED Medications  buPROPion (WELLBUTRIN XL) 24 hr tablet 450 mg (has no administration in time range)  citalopram (CELEXA) tablet 40 mg (has no administration in time range)  etodolac (LODINE) tablet 400 mg (has no administration in time range)  folic acid (FOLVITE) tablet 2 mg (has no administration in time range)  loratadine (CLARITIN) tablet 10 mg (has no  administration in time range)  methocarbamol (ROBAXIN) tablet 750 mg (has no administration in time range)  traZODone (DESYREL) tablet 75 mg (has no administration in time range)  Vitamin D (Ergocalciferol) (DRISDOL) capsule 50,000 Units (has no administration in time range)  traMADol (ULTRAM) tablet 50 mg (has no administration in time range)  pantoprazole (PROTONIX) EC tablet 40 mg (has no administration in time range)  methocarbamol (ROBAXIN) tablet 500 mg (500 mg Oral Given 03/13/18 2103)     Initial Impression / Assessment and Plan / ED Course  I have reviewed the triage vital signs and the nursing notes.  Pertinent labs & imaging results that were available during my  care of the patient were reviewed by me and considered in my medical decision making (see chart for details).     62 y.o. female presenting for "pyschotic break".  She reports visual and auditory hallucinations.  She denies any SI/HI.  No alcohol or drug use.  Patient also reports that she recently had a total left hip from an anterior approach by Dr. Berenice Primas.  She is been having pain in her left hip as well as left lower extremity swelling.  She also reports some urinary incontinence.  She denies any fever, flank pain, abdominal pain.  No other symptoms.  Patient's vitals are reassuring on presentation.  She is without fever.  Exam is with no abdominal tenderness.  Bladder scan is with 700 cc.  Foley catheter was placed. Will check UA.  Left hip is with well-appearing surgical wound.  There is no evidence of dehiscence.  No evidence of infection.  Patient is without fever.  No joint swelling, erythema or heat.  She has intact passive range of motion.  Patient can ambulate without difficulty.  Do not suspect infected joint.  Patient refuses x-ray stating that she had this at her orthopedist a few days ago.  Patient is neurovascular intact distally and confirmed with Doppler pulses.  Ultrasound was obtained and negative for DVT.  I  do not feel patient has orthopedic related complaints causing her symptoms.  Labs obtained and reassuring.  Patient without leukocytosis.  Creatinine within normal limits.  Labs otherwise reassuring as above.    10:06 PM Family had reached out to Dr. Berenice Primas and asked him to give me a call.  I have spoken with him and updated him on patient.  We discussed patient's pain and based on patient's recent visit to his office, my exam findings, stable vital signs, neurovascular status, negative DVT, ability for patient to bear weight and range the hip and no physical exam findings of infection I do not suspect this is related to the surgery.  Dr. Berenice Primas in agreement.  He states if patient is there overnight he will plan to follow-up and see the patient in the morning.  Otherwise if the patient does go home she can follow-up in the office either Tuesday or Thursday.    With UA pending, case signed out to Dr. Francia Greaves. Prior to sign out case was discussed with Dr. Francia Greaves who is in agreement with the plan. Patient has met inpatient criteria by [facility].   Final Clinical Impressions(s) / ED Diagnoses   Final diagnoses:  Visual hallucination  Auditory hallucination  History of left hip replacement  Urinary retention    ED Discharge Orders    None       Lorelle Gibbs 03/13/18 2225    Valarie Merino, MD 03/14/18 0010

## 2018-03-13 NOTE — ED Notes (Signed)
Family becoming verbally aggressive and refusing to allow pt to move to psych area. Family states they have not seen a MD. EDP made aware. EDP to bedside.

## 2018-03-13 NOTE — BH Assessment (Addendum)
Tele Assessment Note   Patient Name: Lauren Huffman MRN: [id number] Referring Physician: Leary RocaMichael Maczis, PA-C [facility]: [facility], [phone] Location of Provider: Behavioral Health TTS Department  Lauren Huffman RegisterM Huffman is an 62 y.o. married female who presents [facility] accompanied by her husband and daughter, who participated in assessment at Pt's request. Pt reports a long history of depression with symptoms that have worsened since she had surgery on her left hip approximately two weeks ago. Pt states "I'Lauren know I'Lauren having a psychotic episode." Pt presents with pressured speech, tangential and disorganized thought process. She repeats the same things several times. She states she is experiencing auditory hallucinations of deceased people who are talking to her from heaven and telling her to do things. She says these hallucinations began last night and she has never experienced hallucinations before. Pt states "I have a heightened sense of time." Pt's husband reports Pt has had problems with her memory recently and "disjointed thoughts." He reports she has had very little sleep and has been crying frequently. Pt states she has a poor appetite and husband reports she has not been eating well. She acknowledges symptoms including social withdrawal, anhedonia, decreased concentration, fatigue and feelings of worthlessness. Pt denies current suicidal ideation or history of suicide attempts. Pt has no history of intentional self-injurious behaviors. She denies current homicidal ideation or history of violence. Pt denies alcohol or other substance use.  Pt identifies complications from her hip surgery has her primary stressor. She states her leg is swollen and sore and she has been experiencing urinary incontinence. Pt says she has been walking with a walker at home but since her leg has become swollen she feels she needs a wheelchair. 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 Pt lives with her husband and daughter and identifies them and her son, a Hydrographic surveyorlaw enforcement officer, as her primary supports. She denies legal problems.   Pt is currently receiving outpatient medication management with [facility]. She reports taking all her medications as prescribed. Pt denies any history of inpatient psychiatric treatment.   Pt is dressed in hospital gown, alert and oriented x4. Pt speaks in a clear tone, at moderate volume and fast pace. Motor behavior appears normal. Eye contact is good. Pt's mood is anxious and affect is anxious and preoccupied. Thought process is tangential. Pt was cooperative throughout assessment. Pt states she knows she needs to be psychiatrically hospitalized and is willing to sign into a psychiatric facility.   Diagnosis: F33.3 Major depressive disorder, Recurrent episode, With psychotic features  Past Medical History:  Past Medical History:  Diagnosis Date  . Aneurysm, lower extremity (HCC)   . Anxiety   . Arthritis   . Carpal tunnel syndrome    Right  . Chronic pain   . Depression   . GERD (gastroesophageal reflux disease)   . Headache(784.0)   . Hiatal hernia   . History of colon polyps   . History of iron deficiency   . Hyperlipemia   . Hypertension   . Left arm pain   . Memory difficulty   . Mental disorder   . OSA (obstructive sleep apnea)   . Osteoarthritis   . Sleep apnea    DOES NOT WEAR CPAP    . Sleep disturbance   . Status post ablation of incompetent vein using laser  2x in Nov. 2017   right  . SVT (supraventricular tachycardia) (HCC)   . TMJ (dislocation of temporomandibular joint)   . Varicose veins   . Vitamin D insufficiency     Past Surgical History:  Procedure Laterality Date  . CYST REMOVAL HAND Left   . JOINT REPLACEMENT Right    knee  . KNEE ARTHROPLASTY  12/25/2011   Procedure: COMPUTER ASSISTED TOTAL  KNEE ARTHROPLASTY;  Surgeon: [name], MD;  Location: [facility] OR;  Service: Orthopedics;  Laterality: Right;  Right total knee replacement, general anesthesia, femoral nerve block  . KNEE ARTHROPLASTY Left 07/08/2012   Procedure: COMPUTER ASSISTED TOTAL KNEE ARTHROPLASTY;  Surgeon: [name], MD;  Location: [facility] OR;  Service: Orthopedics;  Laterality: Left;  PRE OP FEMORAL NERVE BLOCK  . lazer vein surgery  2017   right leg  . TOTAL HIP ARTHROPLASTY Right 03/27/2016  . TOTAL HIP ARTHROPLASTY Right 03/27/2016   Procedure: TOTAL HIP ARTHROPLASTY ANTERIOR APPROACH;  Surgeon: Lauren Geralds, MD;  Location: [facility] OR;  Service: Orthopedics;  Laterality: Right;  . TOTAL KNEE ARTHROPLASTY Left 07/08/2012   [name]    Family History:  Family History  Problem Relation Age of Onset  . Stroke Mother   . Seizures Mother   . Memory loss Mother   . CAD Mother        had a stent  . Hyperlipidemia Mother   . Cancer Father        Salivary Gland   . Heart disease Father        2 silent MI's  . Supraventricular tachycardia Sister   . Hyperlipidemia Sister     Social History:  reports that she has never smoked. She has never used smokeless tobacco. She reports that she drank alcohol. She reports that she does not use drugs.  Additional Social History:  Alcohol / Drug Use Pain Medications: See MAR Prescriptions: See MAR Over the Counter: See MAR History of alcohol / drug use?: No history of alcohol / drug abuse Longest period of sobriety (when/how long): NA  CIWA: CIWA-Ar BP: (!) 154/97 Pulse Rate: 92 COWS:    Allergies:  Allergies  Allergen Reactions  . Cartia Xt [Diltiazem Hcl Er Beads] Other (See Comments)    Increases depression  . Gabapentin Other (See Comments)    Weakness and muscle contractions  . Metoprolol Other (See Comments)    INCREASED DEPRESSION   . Sulfa Antibiotics     Family History    Home Medications:  (Not in a hospital admission)  OB/GYN Status:  No LMP recorded.  Patient is postmenopausal.  General Assessment Data Location of Assessment: [facility] TTS Assessment: In system Is this a Tele or Face-to-Face Assessment?: Tele Assessment Is this an Initial Assessment or a Re-assessment for this encounter?: Initial Assessment Patient Accompanied by:: Other(Husband and daughter) Language Other than English: No Living Arrangements: Other (Comment)(With husband) What gender do you identify as?: Female Marital status: Married Artist name: Mosley Pregnancy Status: No Living Arrangements: Spouse/significant other, Children Can pt return to current living arrangement?: Yes Admission Status: Voluntary Is patient capable of signing voluntary admission?: Yes Referral Source: Self/Family/Friend Insurance type: BCBS     Crisis Care Plan Living Arrangements: Spouse/significant other, Children Legal Guardian: Other:(Self) Name of Psychiatrist: [facility]/Dr. Wynonia Lawman Name of Therapist: None  Education Status Is patient currently in school?: No Is the patient employed, unemployed or receiving disability?: Employed  Risk to self with the past 6 months Suicidal Ideation:  No Has patient been a risk to self within the past 6 months prior to admission? : No Suicidal Intent: No Has patient had any suicidal intent within the past 6 months prior to admission? : No Is patient at risk for suicide?: No Suicidal Plan?: No Has patient had any suicidal plan within the past 6 months prior to admission? : No Access to Means: No What has been your use of drugs/alcohol within the last 12 months?: Pt denies Previous Attempts/Gestures: No How many times?: 0 Other Self Harm Risks: None Triggers for Past Attempts: None known Intentional Self Injurious Behavior: None Family Suicide History: Yes Recent stressful life event(s): Other (Comment)(Recent surgery) Persecutory voices/beliefs?: No Depression: Yes Depression Symptoms: Despondent, Insomnia, Tearfulness, Isolating,  Fatigue, Guilt, Loss of interest in usual pleasures Substance abuse history and/or treatment for substance abuse?: No Suicide prevention information given to non-admitted patients: Not applicable  Risk to Others within the past 6 months Homicidal Ideation: No Does patient have any lifetime risk of violence toward others beyond the six months prior to admission? : No Thoughts of Harm to Others: No Current Homicidal Intent: No Current Homicidal Plan: No Access to Homicidal Means: No Identified Victim: None History of harm to others?: No Assessment of Violence: None Noted Violent Behavior Description: None Does patient have access to weapons?: Yes (Comment)(Rifle in the home but Pt doesn't have access) Criminal Charges Pending?: No Does patient have a court date: No Is patient on probation?: No  Psychosis Hallucinations: Auditory, With command Delusions: None noted  Mental Status Report Appearance/Hygiene: In scrubs Eye Contact: Good Motor Activity: Unremarkable Speech: Pressured, Tangential Level of Consciousness: Alert Mood: Anxious Affect: Anxious, Preoccupied Anxiety Level: Moderate Thought Processes: Tangential, Flight of Ideas Judgement: Impaired Orientation: Person, Place, Time, Situation Obsessive Compulsive Thoughts/Behaviors: None  Cognitive Functioning Concentration: Decreased Memory: Recent Intact, Remote Intact Is patient IDD: No Insight: Fair Impulse Control: Fair Appetite: Poor Have you had any weight changes? : Loss Amount of the weight change? (lbs): 70 lbs Sleep: Decreased Total Hours of Sleep: 4 Vegetative Symptoms: None  ADLScreening [facility] Assessment Services) Patient's cognitive ability adequate to safely complete daily activities?: Yes Patient able to express need for assistance with ADLs?: Yes Independently performs ADLs?: No  Prior Inpatient Therapy Prior Inpatient Therapy: No  Prior Outpatient Therapy Prior Outpatient Therapy:  Yes Prior Therapy Dates: ongoing Prior Therapy Facilty/Provider(s): [facility] Reason for Treatment: Depression Does patient have an ACCT team?: No Does patient have Intensive In-House Services?  : No Does patient have [facility] services? : No Does patient have P4CC services?: No  ADL Screening (condition at time of admission) Patient's cognitive ability adequate to safely complete daily activities?: Yes Is the patient deaf or have difficulty hearing?: No Does the patient have difficulty seeing, even when wearing glasses/contacts?: No Does the patient have difficulty concentrating, remembering, or making decisions?: No Patient able to express need for assistance with ADLs?: Yes Does the patient have difficulty dressing or bathing?: Yes Independently performs ADLs?: No Communication: Independent Dressing (OT): Independent Grooming: Independent Feeding: Independent Bathing: Needs assistance Is this a change from baseline?: Pre-admission baseline Toileting: Independent In/Out Bed: Independent Walks in Home: Independent with device (comment) Does the patient have difficulty walking or climbing stairs?: Yes Weakness of Arms/Hands: None  Home Assistive Devices/Equipment Home Assistive Devices/Equipment: Walker (specify type)    Abuse/Neglect Assessment (Assessment to be complete while patient is alone) Abuse/Neglect Assessment Can Be Completed: Yes Physical Abuse: Denies Verbal Abuse: Denies Sexual Abuse: Denies Exploitation of  patient/patient's resources: Denies Self-Neglect: Denies     Merchant navy officer (For Healthcare) Does Patient Have a Medical Advance Directive?: No Would patient like information on creating a medical advance directive?: No - Patient declined          Disposition: [place]innie Rail, [facility], confirmed adult unit is currently at capacity. Gave clinical report to Nira Conn, NP who said Pt meets criteria for inpatient psychiatric treatment.  TTS will contact other facilities for placement. Notified Dr. Kristine Royal and Fraser Din, RN of recommendation.  Disposition Initial Assessment Completed for this Encounter: Yes  This service was provided via telemedicine using a 2-way, interactive audio and video technology.  Names of all persons participating in this telemedicine service and their role in this encounter. Name: Audie Box Role: Patient  Name: Joellyn Rued Role: Husband  Name: Shela Commons, [place] Role: TTS counselor      Harlin Rain Patsy [place], [place] [facility] Triage Specialist [phone]  Pamalee Leyden 03/13/2018 8:48 PM

## 2018-03-13 NOTE — ED Notes (Signed)
Pt states she feels as if she needs to urinate. Provided with female urinal. Pt unable to void. Bladder scan shows 700cc in bladder. PA made aware.

## 2018-03-13 NOTE — ED Triage Notes (Signed)
Pt bib family with reports of "psychotic break". Pt endorses hearing voices of "ancestors". Denies SI/HI.

## 2018-03-13 NOTE — ED Notes (Signed)
Pt states she has to go to the bathroom. This RN offered pt a bedpan. Pt refusing bedpan. This RN offered to wheel pt to the restroom. Pt refusing this as well. Pt stating her daughter is a [employer] boy[employer] on 2W and she will have her daughter take her.

## 2018-03-13 NOTE — ED Notes (Signed)
[facility]. [facility]

## 2018-03-14 ENCOUNTER — Telehealth (HOSPITAL_COMMUNITY): Payer: Self-pay | Admitting: Professional

## 2018-03-14 ENCOUNTER — Emergency Department (HOSPITAL_COMMUNITY): Payer: BC Managed Care – PPO

## 2018-03-14 DIAGNOSIS — R339 Retention of urine, unspecified: Secondary | ICD-10-CM

## 2018-03-14 DIAGNOSIS — R440 Auditory hallucinations: Secondary | ICD-10-CM

## 2018-03-14 DIAGNOSIS — Z96642 Presence of left artificial hip joint: Secondary | ICD-10-CM

## 2018-03-14 DIAGNOSIS — R441 Visual hallucinations: Secondary | ICD-10-CM

## 2018-03-14 DIAGNOSIS — G4700 Insomnia, unspecified: Secondary | ICD-10-CM | POA: Diagnosis present

## 2018-03-14 DIAGNOSIS — F5101 Primary insomnia: Secondary | ICD-10-CM

## 2018-03-14 LAB — RAPID URINE DRUG SCREEN, HOSP PERFORMED
Amphetamines: NOT DETECTED
Barbiturates: NOT DETECTED
Benzodiazepines: NOT DETECTED
Cocaine: NOT DETECTED
Opiates: NOT DETECTED
TETRAHYDROCANNABINOL: NOT DETECTED

## 2018-03-14 LAB — URINALYSIS, ROUTINE W REFLEX MICROSCOPIC
Bilirubin Urine: NEGATIVE
Glucose, UA: NEGATIVE mg/dL
Hgb urine dipstick: NEGATIVE
Ketones, ur: NEGATIVE mg/dL
Leukocytes, UA: NEGATIVE
Nitrite: NEGATIVE
Protein, ur: NEGATIVE mg/dL
SPECIFIC GRAVITY, URINE: 1.006 (ref 1.005–1.030)
pH: 9 — ABNORMAL HIGH (ref 5.0–8.0)

## 2018-03-14 MED ORDER — MIRTAZAPINE 7.5 MG PO TABS: 7.5000 mg | ORAL_TABLET | Freq: Every day | ORAL | 0 refills | Status: DC

## 2018-03-14 MED ORDER — CALCIUM CARBONATE-VITAMIN D 500-200 MG-UNIT PO TABS
1.0000 | ORAL_TABLET | Freq: Every day | ORAL | Status: DC
  Administered 2018-03-14: 1 via ORAL
  Filled 2018-03-14: qty 1

## 2018-03-14 MED ORDER — ASPIRIN 325 MG PO TABS
325.0000 mg | ORAL_TABLET | Freq: Two times a day (BID) | ORAL | Status: DC
  Administered 2018-03-14: 325 mg via ORAL
  Filled 2018-03-14: qty 1

## 2018-03-14 MED ORDER — ADULT MULTIVITAMIN W/MINERALS CH
1.0000 | ORAL_TABLET | Freq: Every day | ORAL | Status: DC
  Administered 2018-03-14: 1 via ORAL
  Filled 2018-03-14: qty 1

## 2018-03-14 MED ORDER — VITAMIN D 25 MCG (1000 UNIT) PO TABS
2000.0000 [IU] | ORAL_TABLET | Freq: Every day | ORAL | Status: DC
  Administered 2018-03-14: 2000 [IU] via ORAL
  Filled 2018-03-14: qty 2

## 2018-03-14 MED ORDER — FERROUS SULFATE 325 (65 FE) MG PO TABS
325.0000 mg | ORAL_TABLET | Freq: Every day | ORAL | Status: DC
  Administered 2018-03-14: 325 mg via ORAL
  Filled 2018-03-14: qty 1

## 2018-03-14 MED ORDER — VITAMIN C 500 MG PO TABS: 250.0000 mg | ORAL_TABLET | Freq: Every day | ORAL | Status: DC | PRN

## 2018-03-14 MED ORDER — MIRTAZAPINE 15 MG PO TABS: 7.5000 mg | ORAL_TABLET | Freq: Every day | ORAL | Status: DC

## 2018-03-14 MED ORDER — DOCUSATE SODIUM 100 MG PO CAPS
100.0000 mg | ORAL_CAPSULE | Freq: Two times a day (BID) | ORAL | Status: DC
  Administered 2018-03-14: 100 mg via ORAL
  Filled 2018-03-14: qty 1

## 2018-03-14 MED ORDER — TRAZODONE HCL 100 MG PO TABS: 100.0000 mg | ORAL_TABLET | Freq: Every day | ORAL | 0 refills | Status: DC

## 2018-03-14 MED ORDER — MELATONIN 3 MG PO TABS
3.0000 mg | ORAL_TABLET | Freq: Every day | ORAL | Status: DC
  Administered 2018-03-14: 3 mg via ORAL
  Filled 2018-03-14: qty 1

## 2018-03-14 NOTE — ED Notes (Signed)
Xray tech has spoken with patient about getting pelvis xray done; [facility]

## 2018-03-14 NOTE — Evaluation (Signed)
Physical Therapy Evaluation Patient Details Name: Lauren Huffman MRN: [id number] DOB: 1955/09/29 Today's Date: 03/14/2018   History of Present Illness  Lauren Huffman is a 62 y.o. female was extensive past medical history as below who presents emergency department today for medical clearance.  Patient's family at bedside and supportive.  Patient reports she is "having a psychotic break".  She reports that she has suffered with depression since her mid 67s.  She reports that she does not think the trazodone is working for her she has been on for the last 2 years.  Patient reports that she has been having increasing depression recently.  Patient husband reports that the patient started having visual hallucination of ancestors that have since been deceased as well as auditory hallucinations telling her to do things.  Patient reports she has had decreased amount, of sleep recently only sleeping 2 hours per night.  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  She denies any SI/HI.  She denies any alcohol or drug use.  Patient does report that she has been having increasing pain in her left hip as well as swelling of her left lower extremity status post a direct anterior hip replacement by Dr. Luiz Blare the day after Thanksgiving.  Clinical Impression  Pt admitted with above diagnosis. Pt currently with functional limitations due to the deficits listed below (see PT Problem List). Pt was abl e to ambulate with RW with good stability overall and occasional cues.  Pt should progress well and be able to go to Behavioral health if in agreement or home with assist.   Pt will benefit from skilled PT to increase their independence and safety with mobility to allow discharge to the venue listed below.      Follow Up Recommendations Home health PT;Supervision/Assistance - 24 hour( at Behavioral Health/ if refuses this, Outpt PT)    Equipment Recommendations  None recommended by PT    Recommendations for Other  Services       Precautions / Restrictions Precautions Precautions: None Restrictions Weight Bearing Restrictions: No      Mobility  Bed Mobility Overal bed mobility: Needs Assistance Bed Mobility: Supine to Sit     Supine to sit: Min guard     General bed mobility comments: able to come to EOB and get back in bed with min guard assist.  Cues only.   Transfers Overall transfer level: Needs assistance Equipment used: Rolling walker (2 wheeled) Transfers: Sit to/from [employer] Sit to Stand: Min guard Stand pivot transfers: Min guard       General transfer comment: No issues with transfers with good safety with RW.   Ambulation/Gait Ambulation/Gait assistance: Supervision;Min guard Gait Distance (Feet): 150 Feet Assistive device: Rolling walker (2 wheeled) Gait Pattern/deviations: Step-to pattern;Decreased weight shift to left;Decreased stance time - left;Antalgic   Gait velocity interpretation: <1.31 ft/sec, indicative of household ambulator General Gait Details: Pt with overall good safety with RW with occasional cues to stay close to RW and cues to sequence steps.   Stairs            Wheelchair Mobility    Modified Rankin (Stroke Patients Only)       Balance Overall balance assessment: Needs assistance Sitting-balance support: No upper extremity supported;Feet supported Sitting balance-Leahy Scale: Fair     Standing balance support: During functional activity;No upper extremity supported Standing balance-Leahy Scale: Fair Standing balance comment: can stand statically without UE support to wipe once she  used 3N1                             Pertinent Vitals/Pain Pain Assessment: 0-10 Pain Score: 5  Pain Location: left hip and knee Pain Descriptors / Indicators: Aching;Grimacing;Guarding Pain Intervention(s): Limited activity within patient's tolerance;Monitored during session;Premedicated before session;Repositioned     Home Living Family/patient expects to be discharged to:: Other (Comment)(Behavioral Health) Living Arrangements: Spouse/significant other;Children Available Help at Discharge: Family;Available 24 hours/day Type of Home: House Home Access: Stairs to enter Entrance Stairs-Rails: None Entrance Stairs-Number of Steps: 2 Home Layout: Two level;Able to live on main level with bedroom/bathroom;Laundry or work area in Pitney Bowesbasement Home Equipment: Gilmer MorCane - single point;Walker - 2 wheels;Bedside commode;Shower seat;Adaptive equipment;Wheelchair - manual Additional Comments: Plans to [facility] and then home with Outpt PT.     Prior Function Level of Independence: Independent with assistive device(s)         Comments: using RW or wheelchair after surgery     Hand Dominance   Dominant Hand: Right    Extremity/Trunk Assessment   Upper Extremity Assessment Upper Extremity Assessment: Defer to OT evaluation    Lower Extremity Assessment Lower Extremity Assessment: LLE deficits/detail LLE Deficits / Details: grossly 3+/5    Cervical / Trunk Assessment Cervical / Trunk Assessment: Kyphotic(slight anterior lean of trunk)  Communication   Communication: No difficulties  Cognition Arousal/Alertness: Awake/alert Behavior During Therapy: WFL for tasks assessed/performed Overall Cognitive Status: Impaired/Different from baseline Area of Impairment: Safety/judgement;Problem solving;Following commands                       Following Commands: Follows one step commands consistently       General Comments: Pt admit with visual and auditory hallucinations and "psychotic" state.       General Comments      Exercises General Exercises - Lower Extremity Ankle Circles/Pumps: AROM;Both;10 reps;Seated Long Arc Quad: AROM;Both;10 reps;Seated Hip ABduction/ADduction: AROM;Both;10 reps;Supine Straight Leg Raises: AROM;Both;10 reps;Supine   Assessment/Plan    PT  Assessment Patient needs continued PT services  PT Problem List Decreased strength;Decreased activity tolerance;Decreased balance;Decreased mobility;Decreased knowledge of use of DME;Decreased safety awareness;Decreased cognition;Decreased knowledge of precautions;Cardiopulmonary status limiting activity;Pain       PT Treatment Interventions DME instruction;Gait training;Functional mobility training;Balance training;Therapeutic exercise;Therapeutic activities;Patient/family education;Cognitive remediation;Stair training    PT Goals (Current goals can be found in the Care Plan section)  Acute Rehab PT Goals Patient Stated Goal: [facility] and then home PT Goal Formulation: With patient Time For Goal Achievement: 03/28/18 Potential to Achieve Goals: Good    Frequency Min 3X/week   Barriers to discharge        Co-evaluation               AM-PAC PT "6 Clicks" Mobility  Outcome Measure Help needed turning from your back to your side while in a flat bed without using bedrails?: None Help needed moving from lying on your back to sitting on the side of a flat bed without using bedrails?: None Help needed moving to and from a bed to a chair (including a wheelchair)?: A Little Help needed standing up from a chair using your arms (e.g., wheelchair or bedside chair)?: A Little Help needed to walk in hospital room?: A Little Help needed climbing 3-5 steps with a railing? : A Lot 6 Click Score: 19    End of Session Equipment Utilized  During Treatment: Gait belt Activity Tolerance: Patient limited by fatigue Patient left: with call bell/phone within reach;in bed;with nursing/sitter in room Nurse Communication: Mobility status PT Visit Diagnosis: Muscle weakness (generalized) (M62.81);Pain Pain - Right/Left: Left Pain - part of body: Hip    Time: 1610-9604 PT Time Calculation (min) (ACUTE ONLY): 59 min   Charges:   PT Evaluation $PT Eval Moderate Complexity: 1  Mod PT Treatments $Gait Training: 8-22 mins $Therapeutic Exercise: 8-22 mins $Therapeutic Activity: 8-22 mins        Kona Lover,PT Acute Rehabilitation Services Pager:  [phone]  Office:  [phone]    Berline Lopes 03/14/2018, 11:00 AM

## 2018-03-14 NOTE — ED Triage Notes (Signed)
Foley DC at 1230

## 2018-03-14 NOTE — ED Triage Notes (Signed)
SEC to page the [facility] to see PT.

## 2018-03-14 NOTE — ED Triage Notes (Signed)
Pt reports to this writer  she [facility] but agrees when Lear CorporationJeff H ,[place] talks to her.

## 2018-03-14 NOTE — Progress Notes (Signed)
Pt. meets criteria for inpatient treatment per XXX, NP.  Referred out to the following hospitals:  [facility]  [facility]  [facility]  [facility]  [facility]     Disposition CSW will continue to follow for placement.  Timmothy EulerJean T. Kaylyn LimSutter, MSW, LCSWA Disposition Clinical Social Work [phone] (cell) [phone] (office)

## 2018-03-14 NOTE — ED Triage Notes (Signed)
Pt disputed meds that are listed on MAR. Pt asked when she last had Ultram and Robaxin . Pt informed Ultram and Robaxin was given at 06:39 . Pt voices she does not remember getting meds. Pt reported her primary interest was to get sleep. Pt then wanted to look at the computer at her med list . Pt instructed unable to allow pt to look at computer. Pt was instructed the Pharmacy tech could come and review all meds with her.

## 2018-03-14 NOTE — ED Triage Notes (Signed)
Pt Husband at staff desk to report [facility].

## 2018-03-14 NOTE — ED Notes (Signed)
Patient transported to X-ray 

## 2018-03-14 NOTE — Progress Notes (Signed)
CSW notified [facility] admission[facility] staff Corrie Dandy(Mary) that pt is being discharged.  Wells GuilesSarah Callum Wolf, LCSW, LCAS Disposition CSW [facility] [phone]

## 2018-03-14 NOTE — ED Provider Notes (Signed)
  62 year old female here with hallucinations.  Patient is accepted to inpatient behavioral health.  She would like to either stay 1 more night in the hospital or go home she does not want to go to a very health.  She denies any ongoing hallucinations.  She is clear in thought and insightful, she is under no acute distress, and is medically cleared here.  I do find it reasonable for patient to follow-up as an outpatient as she already has scheduled follow-up appointments.  Behavior health will reevaluate patient and give recommendations.   Eyvonne MechanicHedges, Ashla Murph, PA-C 03/14/18 1628    Gerhard MunchLockwood, Robert, MD 03/15/18 2033

## 2018-03-14 NOTE — ED Triage Notes (Signed)
Physical Therapy in room with PT to eval.

## 2018-03-14 NOTE — ED Triage Notes (Signed)
Pharmacy tech in Pt room to review Med list . Pt family in room while Pharmacy Tech [facility]

## 2018-03-14 NOTE — ED Provider Notes (Signed)
Previous notes, labs, and vitals reviewed. Discussed with RN, who reports patient had recent hip surgery, and is stating that she needs to ambulate once an hour.  RN is requesting PT/OT eval for further recommendations prior to discharge to a facility.  X-ray reviewed, no fracture, dislocation, or acute concerning findings Patient is pending placement at for behavioral health.  Per RN, patient and family have been very inappropriate, ignoring rules and becoming aggressive with staff. Pt and family have continued concerns about medications list.    Alveria ApleyCaccavale, Forest Pruden, PA-C 03/14/18 [id number]    Jacalyn LefevreHaviland, Julie, MD 03/14/18 [phone]

## 2018-03-14 NOTE — Discharge Instructions (Addendum)

## 2018-03-14 NOTE — Consult Note (Signed)
[facility] ED Discharge  03/14/2018 4:19 PM [name]  MRN:  [id number] Principal Problem: Insomnia Discharge Diagnoses: Principal Problem:   Insomnia  Subjective: 62 yo female who presented to the ED with insomnia and seeing her ancestors last Saturday night.  She has no suicidal/homicidal ideations, hallucinations, or substance abuse.  [name] has been having insomnia for years but worse since this summer which has worsened her depression and anxiety.  She goes to see a NP but feels she keeps adding medications instead of resolving the problem.  Discussed medications and decided on Remeron 7.5 mg at bedtime with a decrease in Trazodone to 100 mg from 150, tapering already.  She wants to discharge and follow up with her appointment on Thursday and attend intensive outpatient.  Stable for discharge.  Total Time spent with patient: 30 minutes  Past Psychiatric History: depression, anxiety, insomnia  Past Medical History:  Past Medical History:  Diagnosis Date  . Aneurysm, lower extremity (HCC)   . Anxiety   . Arthritis   . Carpal tunnel syndrome    Right  . Chronic pain   . Depression   . GERD (gastroesophageal reflux disease)   . Headache(784.0)   . Hiatal hernia   . History of colon polyps   . History of iron deficiency   . Hyperlipemia   . Hypertension   . Left arm pain   . Memory difficulty   . Mental disorder   . OSA (obstructive sleep apnea)   . Osteoarthritis   . Sleep apnea    DOES NOT WEAR CPAP    . Sleep disturbance   . Status post ablation of incompetent vein using laser 2x in Nov. 2017   right  . SVT (supraventricular tachycardia) (HCC)   . TMJ (dislocation of temporomandibular joint)   . Varicose veins   . Vitamin D insufficiency    Past Surgical History:  Procedure Laterality Date  . CYST REMOVAL HAND Left   . JOINT REPLACEMENT Right    knee  . KNEE ARTHROPLASTY  12/25/2011   Procedure: COMPUTER ASSISTED TOTAL KNEE ARTHROPLASTY;  Surgeon: [name],  MD;  Location: [facility] OR;  Service: Orthopedics;  Laterality: Right;  Right total knee replacement, general anesthesia, femoral nerve block  . KNEE ARTHROPLASTY Left 07/08/2012   Procedure: COMPUTER ASSISTED TOTAL KNEE ARTHROPLASTY;  Surgeon: [name], MD;  Location: [facility] OR;  Service: Orthopedics;  Laterality: Left;  PRE OP FEMORAL NERVE BLOCK  . lazer vein surgery  2017   right leg  . TOTAL HIP ARTHROPLASTY Right 03/27/2016  . TOTAL HIP ARTHROPLASTY Right 03/27/2016   Procedure: TOTAL HIP ARTHROPLASTY ANTERIOR APPROACH;  Surgeon: [name], MD;  Location: [facility] OR;  Service: Orthopedics;  Laterality: Right;  . TOTAL KNEE ARTHROPLASTY Left 07/08/2012   [name]   Family History:  Family History  Problem Relation Age of Onset  . Stroke Mother   . Seizures Mother   . Memory loss Mother   . CAD Mother        had a stent  . Hyperlipidemia Mother   . Cancer Father        Salivary Gland   . Heart disease Father        2 silent MI's  . Supraventricular tachycardia Sister   . Hyperlipidemia Sister    Family Psychiatric  History: none Social History:  Social History   Substance and Sexual Activity  Alcohol Use Not Currently    Social History  Substance and Sexual Activity  Drug Use No   Social History   Socioeconomic History  . Marital status: Married    Spouse name: Not on file  . Number of children: 2  . Years of education: Masters  . Highest education level: Not on file  Occupational History  . 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 Financial resource strain: Not on file  . Food insecurity:    Worry: Not on file    Inability: Not on file  . Transportation needs:    Medical: Not on file    Non-medical: Not on file  Tobacco Use  . Smoking status: Never Smoker  . Smokeless tobacco: Never Used  Substance and Sexual Activity  . Alcohol use: Not Currently  . Drug use: No  . Sexual activity: Not on file  Lifestyle  . Physical activity:    Days per week: Not on  file    Minutes per session: Not on file  . Stress: Not on file  Relationships  . Social connections:    Talks on phone: Not on file    Gets together: Not on file    Attends religious service: Not on file    Active member of club or organization: Not on file    Attends meetings of clubs or organizations: Not on file    Relationship status: Not on file  Other Topics Concern  . Not on file  Social History Narrative   Lives at home with husband.   No daily caffeine use.   Right-handed.    Has this patient used any form of tobacco in the last 30 days? (Cigarettes, Smokeless Tobacco, Cigars, and/or Pipes) NA  Current Medications: Current Facility-Administered Medications  Medication Dose Route Frequency Provider Last Rate Last Dose  . aspirin tablet 325 mg  325 mg Oral BID Wynetta Fines, MD   325 mg at 03/14/18 1115  . buPROPion (WELLBUTRIN XL) 24 hr tablet 450 mg  450 mg Oral Daily Jacinto Halim, PA-C   450 mg at 03/14/18 1113  . calcium-vitamin D (OSCAL WITH D) 500-200 MG-UNIT per tablet 1 tablet  1 tablet Oral Q breakfast Wynetta Fines, MD      . cholecalciferol (VITAMIN D3) tablet 2,000 Units  2,000 Units Oral Daily Wynetta Fines, MD   2,000 Units at 03/14/18 1113  . citalopram (CELEXA) tablet 40 mg  40 mg Oral q morning - 10a Jacinto Halim, PA-C   40 mg at 03/14/18 1111  . docusate sodium (COLACE) capsule 100 mg  100 mg Oral BID Wynetta Fines, MD   100 mg at 03/14/18 1112  . ferrous sulfate tablet 325 mg  325 mg Oral Q breakfast Wynetta Fines, MD   325 mg at 03/14/18 0827  . folic acid (FOLVITE) tablet 2 mg  2 mg Oral Daily Jacinto Halim, PA-C   2 mg at 03/14/18 1112  . loratadine (CLARITIN) tablet 10 mg  10 mg Oral Daily Jacinto Halim, PA-C   10 mg at 03/14/18 1115  . Melatonin TABS 3 mg  3 mg Oral Daily Wynetta Fines, MD   3 mg at 03/14/18 1126  . methocarbamol (ROBAXIN) tablet 750 mg  750 mg Oral Q8H Jacinto Halim, PA-C   750 mg at 03/14/18  1610  . multivitamin with minerals tablet 1 tablet  1 tablet Oral Daily Wynetta Fines, MD   1 tablet at 03/14/18 1112  . pantoprazole (PROTONIX)  EC tablet 40 mg  40 mg Oral Daily Jacinto HalimMaczis, Michael M, PA-C   40 mg at 03/14/18 1115  . traMADol (ULTRAM) tablet 50 mg  50 mg Oral Q8H Jacinto HalimMaczis, Michael M, PA-C   50 mg at 03/14/18 [id number]  . traZODone (DESYREL) tablet 75 mg  75 mg Oral QHS Maczis, Elmer SowMichael M, PA-C      . vitamin C (ASCORBIC ACID) tablet 250 mg  250 mg Oral Daily PRN Wynetta FinesMessick, Peter C, MD      . Melene Muller[START ON 03/16/2018] Vitamin D (Ergocalciferol) (DRISDOL) capsule 50,000 Units  50,000 Units Oral Q Wed Maczis, Elmer SowMichael M, PA-C       Current Outpatient Medications  Medication Sig Dispense Refill  . aspirin 325 MG tablet Take 325 mg by mouth 2 (two) times daily.    Marland Kitchen. buPROPion (WELLBUTRIN XL) 150 MG 24 hr tablet Take 450 mg by mouth daily.     . Ca Phosphate-Cholecalciferol (EQL CALCIUM GUMMIES PO) Take 2 tablets by mouth daily.    . citalopram (CELEXA) 40 MG tablet Take 40 mg by mouth every morning.    . Cyanocobalamin (VITAMIN B-12) 2500 MCG SUBL Place 2,500 mcg under the tongue daily.    . diclofenac sodium (VOLTAREN) 1 % GEL Apply 4 g topically 2 (two) times daily.    Marland Kitchen. docusate sodium (COLACE) 100 MG capsule Take 100 mg by mouth 2 (two) times daily.    . ferrous sulfate 325 (65 FE) MG tablet Take 325 mg by mouth daily with breakfast.    . folic acid (FOLVITE) 1 MG tablet Take 2 mg by mouth daily.     Marland Kitchen. loratadine (CLARITIN) 10 MG tablet Take 10 mg by mouth daily.    . Melatonin 5 MG TABS Take 5 mg by mouth at bedtime.     . methocarbamol (ROBAXIN) 750 MG tablet Take 750 mg by mouth every 8 (eight) hours.  0  . Multiple Vitamins-Minerals (MULTIVITAMIN GUMMIES ADULT PO) Take 1 tablet by mouth daily.     Marland Kitchen. omeprazole (PRILOSEC) 40 MG capsule Take 40 mg by mouth 2 (two) times daily.   11  . Probiotic Product (ALIGN) CHEW Chew 1 tablet by mouth daily.    . Simethicone (GAS-X PO) Take 1 tablet by  mouth 3 (three) times daily with meals.     . traMADol (ULTRAM) 50 MG tablet Take 50 mg by mouth every 8 (eight) hours.  0  . traZODone (DESYREL) 100 MG tablet Take 200 mg by mouth at bedtime.     . TURMERIC PO Take 1,075 mg by mouth daily.    . vitamin C (ASCORBIC ACID) 500 MG tablet Take 500 mg by mouth daily.    . Vitamin D, Ergocalciferol, (DRISDOL) 1.25 MG (50000 UT) CAPS capsule Take 50,000 Units by mouth every 7 (seven) days.   0  . Cholecalciferol (VITAMIN D3) 25 MCG (1000 UT) CAPS Take 2,000 Units by mouth daily.      PTA Medications:  (Not in a hospital admission)  Musculoskeletal: Strength & Muscle Tone: decreased Gait & Station: did not witness Patient leans: N/A  Psychiatric Specialty Exam: Physical Exam  Nursing note and vitals reviewed. Constitutional: She is oriented to person, place, and time. She appears well-developed and well-nourished.  HENT:  Head: Normocephalic.  Respiratory: Effort normal.  Neurological: She is alert and oriented to person, place, and time.  Psychiatric: Her speech is normal and behavior is normal. Judgment and thought content normal. Her mood appears anxious. Cognition and  memory are normal. She exhibits a depressed mood.    Review of Systems  Psychiatric/Behavioral: Positive for depression. The patient is nervous/anxious and has insomnia.   All other systems reviewed and are negative.   Blood pressure (!) 148/87, pulse 89, temperature 98.7 F (37.1 C), temperature source Oral, resp. rate 20, height 5\' 11"  (1.803 m), weight 86.5 kg, SpO2 100 %.Body mass index is 26.6 kg/m.  General Appearance: Casual  Eye Contact:  Good  Speech:  Normal Rate  Volume:  Normal  Mood:  Anxious and Depressed  Affect:  Congruent  Thought Process:  Coherent and Descriptions of Associations: Intact  Orientation:  Full (Time, Place, and Person)  Thought Content:  WDL and Logical  Suicidal Thoughts:  No  Homicidal Thoughts:  No  Memory:  Immediate;    Good Recent;   Good Remote;   Good  Judgement:  Good  Insight:  Good  Psychomotor Activity:  Normal  Concentration:  Concentration: Good and Attention Span: Good  Recall:  Good  Fund of Knowledge:  Good  Language:  Good  Akathisia:  No  Handed:  Right  AIMS (if indicated):     Assets:  Communication Skills Desire for Improvement Financial Resources/Insurance Housing Intimacy Leisure Time Physical Health Resilience Social Support Talents/Skills Transportation Vocational/Educational  ADL's:  Intact  Cognition:  WNL  Sleep:        Demographic Factors:  Caucasian  Loss Factors: Decline in physical health  Historical Factors: NA  Risk Reduction Factors:   Sense of responsibility to family, Religious beliefs about death, Living with another person, especially a relative, Positive social support, Positive therapeutic relationship and Positive coping skills or problem solving skills  Continued Clinical Symptoms:  Depression and anxiety, mild  Cognitive Features That Contribute To Risk:  None    Suicide Risk:  Minimal: No identifiable suicidal ideation.  Patients presenting with no risk factors but with morbid ruminations; may be classified as minimal risk based on the severity of the depressive symptoms  Follow-up Information    Gweneth Dimitri, MD.   Specialty:  [facility] Medicine Contact information: [address] [place] [address] [phone]        [facility].   Contact information: [address] [place] [address]          Plan Of Care/Follow-up recommendations:  Insomnia -Started Remeron 7.5 mg daily at bedtime -Decrease Trazodone 150 mg to 100 mg at bedtime -Discontinue Melatonin  Depression: -Continue Wellbutrin 450 mg daily for depression -Continue Celexa 40 mg daily for depression  Activity:  as tolerated Diet:  heart healthy diet  Disposition: discharge home Nanine Means,  NP 03/14/2018, 4:19 PM

## 2018-03-14 NOTE — Progress Notes (Addendum)
   03/14/18 1400  Clinical Encounter Type  Visited With Patient  Visit Type Psychological support  Referral From Patient  Consult/Referral To Chaplain  Spiritual Encounters  Spiritual Needs Emotional  [facility] talk with chaplain. Patient was sitting on side of bed and wanted to talk for a few minutes. Patient shared her desire to [facility]. Patient said that she need to have her other hip replacement soon. She had left hip replaced in 2017. Patient said she was here because she was unable to sleep for the past 8 days. She had an episode at church which landed her here in the hospital.  Provided empathic listening and the gift of presence. In addition, provided emotional and spiritual support.

## 2018-03-14 NOTE — ED Notes (Signed)
ED Provider at bedside. 

## 2018-03-14 NOTE — Progress Notes (Signed)
Trey PaulaJeff, PA at [facility] called stating pt is requesting to leave and asking for recommendation as to whether or not to let her go as she is currently Voluntary.  He spoke to her and she seems clear now.  This Clinical research associatewriter advised that [facility] Physician Extender, Fransisca KaufmannLaura Davis, PMHNP, would need to see patient first and will make recommendation.  Timmothy EulerJean T. Kaylyn LimSutter, MSW, LCSWA Disposition Clinical Social Work [phone] (cell) [phone] (office)

## 2018-03-14 NOTE — ED Triage Notes (Signed)
Pt ambulated to BR with walker to void . Pt voided post cathter removal.

## 2018-03-15 ENCOUNTER — Telehealth (HOSPITAL_COMMUNITY): Payer: Self-pay | Admitting: Licensed Clinical Social Worker

## 2018-03-15 ENCOUNTER — Telehealth (HOSPITAL_COMMUNITY): Payer: Self-pay | Admitting: Professional

## 2018-03-15 NOTE — Telephone Encounter (Signed)
Cln answered call from pt who states she would like to make an appointment for PHP. Pt reports she was in the hospital earlier in the week and believes that now she needs PHP not IOP. After looking up record, cln informed pt that she had an appt with a PHP clinician on Thursday 03/17/18, however it had been canceled earlier in the week. Cln states that pt currently has an IOP appointment scheduled for Monday 03/21/18 at 8:30. Pt argues that her canceled appointment was for IOP. Cln states that as she did not schedule that appt she cannot answer to what pt was told, however the canceled appt was with a PHP clinician. Cln informed pt that the next opening for a PHP assessment is Monday 03/21/18 at 2:30 and she would have to arrive at 1:30 for paperwork should she want that appointment or she can keep the IOP appointment. Pt [facility] and repeats scheduling information. Pt reports she has one more question, pauses, repeats "I have one more question," pauses, and states "I have one more question but I need to walk right now so I'll call back" and ends the call.

## 2018-03-15 NOTE — Telephone Encounter (Signed)
Cln returned pt's call. Pt's daughter answered the phone and said pt was in an appt. When asked if she could take a msg, cln declined and reported cln would call back later. Daughter asked "May I ask what this is about?" Cln, "I'll call back later." Daughter: "She was trying to make the appointment for me. So you can talk to me." Cln, "I'm calling to speak with Breely so I will call her back at a later time." Daughter: "But the appointment is for me." Cln, "I'll call back later." Daughter, "Fine. OK."

## 2018-03-17 ENCOUNTER — Ambulatory Visit (HOSPITAL_COMMUNITY): Payer: BC Managed Care – PPO

## 2018-03-21 ENCOUNTER — Telehealth (HOSPITAL_COMMUNITY): Payer: Self-pay | Admitting: Psychiatry

## 2018-03-21 NOTE — Telephone Encounter (Signed)
D:  This is a 62 yr old, employed (on leave), married female who arrived with husband to be assessed for PHP vs. MH-IOP.  Pt met with this writer, Ricky Ala, NP, Beather Arbour, RN and Loistine Chance, [facility].  Staff obtained history from patient.  Staff met briefly and determined that pt wasn't appropriate for either program d/t neither program being able to meet her needs.  Pt is requesting nutritional services, art therapy, and more grief/loss. Loistine Chance, LPC gave pt multiple resources/recommendations (ie. [place], [place], etc).  R:  Pt, along with husband were receptive.

## 2018-03-23 ENCOUNTER — Other Ambulatory Visit: Payer: Self-pay

## 2018-03-23 ENCOUNTER — Encounter (HOSPITAL_COMMUNITY): Payer: Self-pay | Admitting: *Deleted

## 2018-03-23 ENCOUNTER — Emergency Department (HOSPITAL_COMMUNITY)
Admission: EM | Admit: 2018-03-23 | Discharge: 2018-03-24 | Disposition: A | Discharge status: Other Acute Inpatient Hospital | Payer: BC Managed Care – PPO | Attending: Emergency Medicine | Admitting: Emergency Medicine

## 2018-03-23 DIAGNOSIS — R451 Restlessness and agitation: Secondary | ICD-10-CM

## 2018-03-23 DIAGNOSIS — F29 Unspecified psychosis not due to a substance or known physiological condition: Secondary | ICD-10-CM

## 2018-03-23 DIAGNOSIS — F333 Major depressive disorder, recurrent, severe with psychotic symptoms: Secondary | ICD-10-CM | POA: Diagnosis not present

## 2018-03-23 DIAGNOSIS — Z79899 Other long term (current) drug therapy: Secondary | ICD-10-CM | POA: Insufficient documentation

## 2018-03-23 DIAGNOSIS — I10 Essential (primary) hypertension: Secondary | ICD-10-CM | POA: Insufficient documentation

## 2018-03-23 DIAGNOSIS — Z7982 Long term (current) use of aspirin: Secondary | ICD-10-CM | POA: Insufficient documentation

## 2018-03-23 NOTE — ED Notes (Signed)
Yong Channelhaplain, Beatrice, at bedside with pt.

## 2018-03-23 NOTE — ED Provider Notes (Signed)
TIME SEEN: 11:31 PM  CHIEF COMPLAINT: Psychosis, agitation  HPI: Patient is a 62 year old female with history of anxiety, hypertension, hyperlipidemia presents to the emergency department with police and her family for concerns for agitation and psychosis.  Patient recently underwent left hip surgery and family states has not been sleeping well only about 1 to 2 hours a night.  She came to the emergency department for what she states was a "psychotic break" October 2.  She states that at that time she thought she was on too many medications causing her symptoms.  It appears she has been on Celexa, Wellbutrin and trazodone for many years.  Family reports that she has been coming increasingly agitated and tonight was at church and refused to leave.  Police arrived and had to physically force patient to leave the church.  She is having religious preservations.  States that multiple family members and friends are going to hell and are with CM.  She tells me that if I leave the room "I am going to hell".  She repeatedly asking staff members what religion they are.  She has rapid, tangential, pressured speech.  Family states that this is abnormal for her to be this agitated and that she yelled at a police officer tonight which is also very atypical for her given her son is a Emergency planning/management officerpolice officer.  Family is very worried about her.  Patient will not answer questions for me at this time until a chaplain is present.  She denies any complaints of new pain.  ROS: Level 5 caveat secondary to psychosis  PAST MEDICAL HISTORY/PAST SURGICAL HISTORY:  Past Medical History:  Diagnosis Date  . Aneurysm, lower extremity (HCC)   . Anxiety   . Arthritis   . Carpal tunnel syndrome    Right  . Chronic pain   . Depression   . GERD (gastroesophageal reflux disease)   . Headache(784.0)   . Hiatal hernia   . History of colon polyps   . History of iron deficiency   . Hyperlipemia   . Hypertension   . Left arm pain   . Memory  difficulty   . Mental disorder   . OSA (obstructive sleep apnea)   . Osteoarthritis   . Sleep apnea    DOES NOT WEAR CPAP    . Sleep disturbance   . Status post ablation of incompetent vein using laser 2x in Nov. 2017   right  . SVT (supraventricular tachycardia) (HCC)   . TMJ (dislocation of temporomandibular joint)   . Varicose veins   . Vitamin D insufficiency     MEDICATIONS:  Prior to Admission medications   Medication Sig Start Date End Date Taking? Authorizing Provider  aspirin 325 MG tablet Take 325 mg by mouth 2 (two) times daily.    [provider]  buPROPion (WELLBUTRIN XL) 150 MG 24 hr tablet Take 450 mg by mouth daily.     [provider]  Ca Phosphate-Cholecalciferol (EQL CALCIUM GUMMIES PO) Take 2 tablets by mouth daily.    [provider]  Cholecalciferol (VITAMIN D3) 25 MCG (1000 UT) CAPS Take 2,000 Units by mouth daily.     [provider]  citalopram (CELEXA) 40 MG tablet Take 40 mg by mouth every morning.    [provider]  Cyanocobalamin (VITAMIN B-12) 2500 MCG SUBL Place 2,500 mcg under the tongue daily.    [provider]  diclofenac sodium (VOLTAREN) 1 % GEL Apply 4 g topically 2 (two) times  daily.    [provider]  docusate sodium (COLACE) 100 MG capsule Take 100 mg by mouth 2 (two) times daily.    [provider]  ferrous sulfate 325 (65 FE) MG tablet Take 325 mg by mouth daily with breakfast.    [provider]  folic acid (FOLVITE) 1 MG tablet Take 2 mg by mouth daily.     [provider]  loratadine (CLARITIN) 10 MG tablet Take 10 mg by mouth daily.    [provider]  methocarbamol (ROBAXIN) 750 MG tablet Take 750 mg by mouth every 8 (eight) hours. 02/25/18   [provider]  mirtazapine (REMERON) 7.5 MG tablet Take 1 tablet (7.5 mg total) by mouth at bedtime. 03/14/18   Charm Rings, NP  Multiple Vitamins-Minerals (MULTIVITAMIN GUMMIES ADULT  PO) Take 1 tablet by mouth daily.     [provider]  omeprazole (PRILOSEC) 40 MG capsule Take 40 mg by mouth 2 (two) times daily.  02/26/15   [provider]  Probiotic Product (ALIGN) CHEW Chew 1 tablet by mouth daily.    [provider]  Simethicone (GAS-X PO) Take 1 tablet by mouth 3 (three) times daily with meals.     [provider]  traMADol (ULTRAM) 50 MG tablet Take 50 mg by mouth every 8 (eight) hours. 03/11/18   [provider]  traZODone (DESYREL) 100 MG tablet Take 1 tablet (100 mg total) by mouth at bedtime. 03/14/18   Charm Rings, NP  TURMERIC PO Take 1,075 mg by mouth daily.    [provider]  vitamin C (ASCORBIC ACID) 500 MG tablet Take 500 mg by mouth daily.    [provider]  Vitamin D, Ergocalciferol, (DRISDOL) 1.25 MG (50000 UT) CAPS capsule Take 50,000 Units by mouth every 7 (seven) days.  01/09/18   [provider]    ALLERGIES:  Allergies  Allergen Reactions  . Cartia Xt [Diltiazem Hcl Er Beads] Other (See Comments)    Increases depression  . Gabapentin Other (See Comments)    Weakness and muscle contractions  . Metoprolol Other (See Comments)    INCREASED DEPRESSION   . Sulfa Antibiotics Other (See Comments)    Family History    SOCIAL HISTORY:  Social History   Tobacco Use  . Smoking status: Never Smoker  . Smokeless tobacco: Never Used  Substance Use Topics  . Alcohol use: Not Currently    FAMILY HISTORY: Family History  Problem Relation Age of Onset  . Stroke Mother   . Seizures Mother   . Memory loss Mother   . CAD Mother        had a stent  . Hyperlipidemia Mother   . Cancer Father        Salivary Gland   . Heart disease Father        2 silent MI's  . Supraventricular tachycardia Sister   . Hyperlipidemia Sister     EXAM: BP (!) 148/93   Pulse 89   Temp 98 F (36.7 C)   Resp 18   Ht 5\' 11"  (1.803 m)   Wt 86.5 kg   SpO2 100%   BMI 26.60 kg/m   CONSTITUTIONAL: Alert and oriented and responds appropriately to questions.  Really, agitated HEAD: Normocephalic EYES: Conjunctivae clear, pupils appear equal, EOMI ENT: normal nose; moist mucous membranes NECK: Supple, no meningismus, no nuchal rigidity, no LAD  CARD: RRR; S1 and S2 appreciated; no murmurs, no clicks, no rubs, no  gallops RESP: Normal chest excursion without splinting or tachypnea; breath sounds clear and equal bilaterally; no wheezes, no rhonchi, no rales, no hypoxia or respiratory distress, speaking full sentences ABD/GI: Normal bowel sounds; non-distended; soft, non-tender, no rebound, no guarding, no peritoneal signs, no hepatosplenomegaly BACK:  The back appears normal and is non-tender to palpation, there is no CVA tenderness EXT: Normal ROM in all joints; non-tender to palpation; no edema; normal capillary refill; no cyanosis, no calf tenderness or swelling    SKIN: Normal color for age and race; warm; no rash NEURO: Moves all extremities equally, no abnormal speech, no facial asymmetry PSYCH: Patient appears psychotic.  She has religious preservations.  Yelling at staff.  Seems agitated.  Has rapid, tangential, pressured speech.  Poor insight and logic.  Poor eye contact.  Will not answer questions regarding SI, HI or hallucinations.    MEDICAL DECISION MAKING: Patient here with psychosis.  She has been on Wellbutrin, Celexa and trazodone for an extended period of time.  Family is concerned that insomnia be be playing a role.  I am concerned that she is a danger to herself and others at this time and is rapidly declining.  I feel that she likely needs behavioral health evaluation and psychiatric admission.  Family states they would be comfortable with psychiatric admission but do not want to go to behavioral health hospital as they state they have had bad experiences there before.  They would be comfortable with psychiatric admission to another facility.  They agree with  labs, urine.  Patient recently had an MRI of her brain in October.  No obvious focal neurologic deficits.  I do not feel she needs repeat head imaging today.  Family also comfortable with plan for me to place patient under IVC.  ED PROGRESS: Home medications have been reordered.  Family is asking for Ativan as well as a state that this is helped her with her agitation, insomnia.  PRN Ativan ordered.  Screening labs unremarkable.  Awaiting TTS evaluation.   1:55 AM  D/w Berna Spare with behavioral health.  They recommend inpatient treatment and I fully agree.  They are aware that the family does not want the patient to be placed at behavioral health hospital.  They will look for placement.  She may need Geri psychiatric admission therefore I will order an EKG and chest x-ray for medical clearance.  3:10 AM  Pt's EKG and CXR show no acute abnormality.  4:47 AM  Pt's UA shows no sign of infection and urine drug screen is negative.  Patient is medically cleared and awaiting psychiatric placement.   I reviewed all nursing notes, vitals, pertinent previous records, EKGs, lab and urine results, imaging (as available).      EKG Interpretation  Date/Time:  Thursday March 24 2018 03:00:07 EST Ventricular Rate:  97 PR Interval:  168 QRS Duration: 106 QT Interval:  364 QTC Calculation: 462 R Axis:   1 Text Interpretation:  Normal sinus rhythm Incomplete right bundle branch block Cannot rule out Anterior infarct , age undetermined Abnormal ECG No significant change since last tracing since 2013 Confirmed by Ward, Baxter Hire [phone]) on 03/24/2018 3:11:00 AM           Ward, Layla Maw, DO 03/24/18 [phone]

## 2018-03-23 NOTE — ED Notes (Signed)
ED Provider at bedside. 

## 2018-03-23 NOTE — ED Notes (Signed)
Pt's daughter requested water for pt from this EMT. Ward, MD approved so water with no ice given to pt.

## 2018-03-23 NOTE — ED Triage Notes (Signed)
Pt arrives via EMS. She was picked up from church because she refused to leave church. She is a/o x 4. Reported she was sleep deprived. Hip surgery several weeks ago and stated that she has been on a lot of pain medication and it is causing her sleep deprivation. VSS. Pt frequenting religious conversation during triage.

## 2018-03-24 ENCOUNTER — Encounter (HOSPITAL_COMMUNITY): Payer: Self-pay | Admitting: Registered Nurse

## 2018-03-24 ENCOUNTER — Emergency Department (HOSPITAL_COMMUNITY): Payer: BC Managed Care – PPO

## 2018-03-24 LAB — COMPREHENSIVE METABOLIC PANEL
ALBUMIN: 3.4 g/dL — AB (ref 3.5–5.0)
ALT: 21 U/L (ref 0–44)
AST: 21 U/L (ref 15–41)
Alkaline Phosphatase: 105 U/L (ref 38–126)
Anion gap: 11 (ref 5–15)
BUN: 22 mg/dL (ref 8–23)
CO2: 24 mmol/L (ref 22–32)
Calcium: 8.9 mg/dL (ref 8.9–10.3)
Chloride: 105 mmol/L (ref 98–111)
Creatinine, Ser: 0.83 mg/dL (ref 0.44–1.00)
GFR calc Af Amer: >60 mL/min (ref >60)
GFR calc non Af Amer: >60 mL/min (ref >60)
Glucose, Bld: 94 mg/dL (ref 70–99)
Potassium: 3.7 mmol/L (ref 3.5–5.1)
Sodium: 140 mmol/L (ref 135–145)
Total Bilirubin: 0.6 mg/dL (ref 0.3–1.2)
Total Protein: 6 g/dL — ABNORMAL LOW (ref 6.5–8.1)

## 2018-03-24 LAB — CBC WITH DIFFERENTIAL/PLATELET
Abs Immature Granulocytes: 0.04 10*3/uL (ref 0.00–0.07)
Basophils Absolute: 0 10*3/uL (ref 0.0–0.1)
Basophils Relative: 1 %
Eosinophils Absolute: 0.2 10*3/uL (ref 0.0–0.5)
Eosinophils Relative: 2 %
HCT: 36 % (ref 36.0–46.0)
Hemoglobin: 11.8 g/dL — ABNORMAL LOW (ref 12.0–15.0)
Immature Granulocytes: 1 %
Lymphocytes Relative: 24 %
Lymphs Abs: 2.1 10*3/uL (ref 0.7–4.0)
MCH: 30.9 pg (ref 26.0–34.0)
MCHC: 32.8 g/dL (ref 30.0–36.0)
MCV: 94.2 fL (ref 80.0–100.0)
MONO ABS: 0.6 10*3/uL (ref 0.1–1.0)
Monocytes Relative: 7 %
Neutro Abs: 5.9 10*3/uL (ref 1.7–7.7)
Neutrophils Relative %: 65 %
Platelets: 375 10*3/uL (ref 150–400)
RBC: 3.82 MIL/uL — ABNORMAL LOW (ref 3.87–5.11)
RDW: 13.7 % (ref 11.5–15.5)
WBC: 8.8 10*3/uL (ref 4.0–10.5)
nRBC: 0 % (ref 0.0–0.2)

## 2018-03-24 LAB — URINALYSIS, COMPLETE (UACMP) WITH MICROSCOPIC
Bacteria, UA: NONE SEEN
Bilirubin Urine: NEGATIVE
Glucose, UA: NEGATIVE mg/dL
Hgb urine dipstick: NEGATIVE
Ketones, ur: 5 mg/dL — AB
Leukocytes, UA: NEGATIVE
Nitrite: NEGATIVE
Protein, ur: NEGATIVE mg/dL
Specific Gravity, Urine: 1.021 (ref 1.005–1.030)
pH: 5 (ref 5.0–8.0)

## 2018-03-24 LAB — RAPID URINE DRUG SCREEN, HOSP PERFORMED
Amphetamines: NOT DETECTED
BARBITURATES: NOT DETECTED
Benzodiazepines: NOT DETECTED
Cocaine: NOT DETECTED
Opiates: NOT DETECTED
Tetrahydrocannabinol: NOT DETECTED

## 2018-03-24 LAB — ETHANOL: Alcohol, Ethyl (B): <10 mg/dL (ref <10)

## 2018-03-24 LAB — ACETAMINOPHEN LEVEL

## 2018-03-24 LAB — SALICYLATE LEVEL: Salicylate Lvl: <7 mg/dL (ref 2.8–30.0)

## 2018-03-24 MED ORDER — VITAMIN D 25 MCG (1000 UNIT) PO TABS
2000.0000 [IU] | ORAL_TABLET | Freq: Every day | ORAL | Status: DC
  Filled 2018-03-24: qty 2

## 2018-03-24 MED ORDER — TRAMADOL HCL 50 MG PO TABS
50.0000 mg | ORAL_TABLET | Freq: Three times a day (TID) | ORAL | Status: DC
  Administered 2018-03-24 (×3): 50 mg via ORAL
  Filled 2018-03-24 (×3): qty 1

## 2018-03-24 MED ORDER — FERROUS SULFATE 325 (65 FE) MG PO TABS
325.0000 mg | ORAL_TABLET | Freq: Every day | ORAL | Status: DC
  Administered 2018-03-24: 325 mg via ORAL
  Filled 2018-03-24: qty 1

## 2018-03-24 MED ORDER — ZIPRASIDONE MESYLATE 20 MG IM SOLR
10.0000 mg | Freq: Once | INTRAMUSCULAR | Status: AC
  Administered 2018-03-24: 10 mg via INTRAMUSCULAR
  Filled 2018-03-24: qty 20

## 2018-03-24 MED ORDER — STERILE WATER FOR INJECTION IJ SOLN
INTRAMUSCULAR | Status: AC
  Administered 2018-03-24: 16:00:00
  Filled 2018-03-24: qty 10

## 2018-03-24 MED ORDER — DICLOFENAC SODIUM 1 % TD GEL
4.0000 g | Freq: Two times a day (BID) | TRANSDERMAL | Status: DC
  Filled 2018-03-24: qty 100

## 2018-03-24 MED ORDER — BUPROPION HCL ER (XL) 150 MG PO TB24
450.0000 mg | ORAL_TABLET | Freq: Every day | ORAL | Status: DC
  Filled 2018-03-24: qty 3

## 2018-03-24 MED ORDER — FOLIC ACID 1 MG PO TABS
2.0000 mg | ORAL_TABLET | Freq: Every day | ORAL | Status: DC
  Filled 2018-03-24: qty 2

## 2018-03-24 MED ORDER — PANTOPRAZOLE SODIUM 40 MG PO TBEC
80.0000 mg | DELAYED_RELEASE_TABLET | Freq: Every day | ORAL | Status: DC
  Filled 2018-03-24: qty 2

## 2018-03-24 MED ORDER — LORATADINE 10 MG PO TABS
10.0000 mg | ORAL_TABLET | Freq: Every day | ORAL | Status: DC
  Filled 2018-03-24: qty 1

## 2018-03-24 MED ORDER — VITAMIN B-12 1000 MCG PO TABS
2500.0000 ug | ORAL_TABLET | Freq: Every day | ORAL | Status: DC
  Filled 2018-03-24: qty 3

## 2018-03-24 MED ORDER — ADULT MULTIVITAMIN W/MINERALS CH
1.0000 | ORAL_TABLET | Freq: Every day | ORAL | Status: DC
  Filled 2018-03-24: qty 1

## 2018-03-24 MED ORDER — RISPERIDONE 0.5 MG PO TABS
0.5000 mg | ORAL_TABLET | Freq: Two times a day (BID) | ORAL | Status: DC
  Filled 2018-03-24: qty 1

## 2018-03-24 MED ORDER — OLANZAPINE 10 MG IM SOLR
5.0000 mg | Freq: Every day | INTRAMUSCULAR | Status: DC
  Administered 2018-03-24: 5 mg via INTRAMUSCULAR
  Filled 2018-03-24: qty 10

## 2018-03-24 MED ORDER — DOCUSATE SODIUM 100 MG PO CAPS
100.0000 mg | ORAL_CAPSULE | Freq: Two times a day (BID) | ORAL | Status: DC
  Administered 2018-03-24: 100 mg via ORAL
  Filled 2018-03-24 (×2): qty 1

## 2018-03-24 MED ORDER — RISAQUAD PO CAPS
1.0000 | ORAL_CAPSULE | Freq: Every day | ORAL | Status: DC
  Filled 2018-03-24: qty 1

## 2018-03-24 MED ORDER — TURMERIC 1053 MG PO TABS: 1075.0000 mg | ORAL_TABLET | Freq: Every day | ORAL | Status: DC

## 2018-03-24 MED ORDER — LORAZEPAM 1 MG PO TABS
1.0000 mg | ORAL_TABLET | Freq: Three times a day (TID) | ORAL | Status: DC | PRN
  Administered 2018-03-24: 1 mg via ORAL
  Filled 2018-03-24: qty 1

## 2018-03-24 MED ORDER — SIMETHICONE 80 MG PO CHEW
80.0000 mg | CHEWABLE_TABLET | Freq: Four times a day (QID) | ORAL | Status: DC | PRN
  Filled 2018-03-24: qty 1

## 2018-03-24 MED ORDER — MIRTAZAPINE 15 MG PO TABS
7.5000 mg | ORAL_TABLET | Freq: Every day | ORAL | Status: DC
  Administered 2018-03-24: 7.5 mg via ORAL
  Filled 2018-03-24: qty 1

## 2018-03-24 MED ORDER — TRAZODONE HCL 100 MG PO TABS
100.0000 mg | ORAL_TABLET | Freq: Every day | ORAL | Status: DC
  Administered 2018-03-24: 100 mg via ORAL
  Filled 2018-03-24: qty 2

## 2018-03-24 MED ORDER — CITALOPRAM HYDROBROMIDE 10 MG PO TABS
40.0000 mg | ORAL_TABLET | Freq: Every morning | ORAL | Status: DC
  Filled 2018-03-24: qty 4

## 2018-03-24 MED ORDER — CALCIUM CARBONATE-VITAMIN D 500-200 MG-UNIT PO TABS
1.0000 | ORAL_TABLET | Freq: Every day | ORAL | Status: DC
  Filled 2018-03-24: qty 1

## 2018-03-24 MED ORDER — ASPIRIN 325 MG PO TABS
325.0000 mg | ORAL_TABLET | Freq: Two times a day (BID) | ORAL | Status: DC
  Administered 2018-03-24: 325 mg via ORAL
  Filled 2018-03-24 (×2): qty 1

## 2018-03-24 MED ORDER — BENZTROPINE MESYLATE 1 MG/ML IJ SOLN
0.5000 mg | Freq: Two times a day (BID) | INTRAMUSCULAR | Status: DC
  Filled 2018-03-24: qty 2

## 2018-03-24 MED ORDER — VITAMIN C 500 MG PO TABS
500.0000 mg | ORAL_TABLET | Freq: Every day | ORAL | Status: DC
  Filled 2018-03-24: qty 1

## 2018-03-24 MED ORDER — OLANZAPINE 5 MG PO TABS: 5.0000 mg | ORAL_TABLET | Freq: Every day | ORAL | Status: DC

## 2018-03-24 MED ORDER — METHOCARBAMOL 500 MG PO TABS
750.0000 mg | ORAL_TABLET | Freq: Three times a day (TID) | ORAL | Status: DC
  Administered 2018-03-24 (×3): 750 mg via ORAL
  Filled 2018-03-24 (×3): qty 2

## 2018-03-24 NOTE — ED Triage Notes (Signed)
PT took pain meds after she opened the pills from blister pack.

## 2018-03-24 NOTE — ED Triage Notes (Signed)
Return call to sheriff for update on transport of PT.

## 2018-03-24 NOTE — ED Triage Notes (Signed)
TC to Pt's Husband ,MR. Hiltz and reported that Pt has been accepted [facility] . [facility]

## 2018-03-24 NOTE — ED Notes (Signed)
Pt ate all of her meal tray

## 2018-03-24 NOTE — ED Notes (Signed)
Patient was given Cookies and Sprite. 

## 2018-03-24 NOTE — ED Notes (Signed)
Pt resting bed sleeping with lights out.

## 2018-03-24 NOTE — ED Notes (Signed)
Pt ambulating with walker in, pt states father is speaking through her to making write a novel that will.

## 2018-03-24 NOTE — ED Notes (Signed)
Pt continues to exhibit manipulative behavior and psychosis. Pt has now requested her meds be given for the second time, initally refused at 9am. All medications opened in front of patient and scanned, reviewed with patient individually. Pt then stated she wasn't taking any of the medications until the charge nurse reviewed them with her. Pt then began demanding that the psych team be called and she talk to them about any new meds. Danny from psych reviewed medications and she stated she wasn't taking them because he was just a Veterinary surgeoncounselor. The patient then demanded that her sister in [place] be called and consulted on all the new medication changes. Pt unable to sign a release of information form due to being under IVC. Pt then demanding to speak with the medical doctor about not being able to ambulate in the hallway, (against unit rules), and again about her medications. Pt then demanding nutrition consult because she was given cookies instead of fruit for her snack. Pt stating" your not giving me my medications Lowella Bandyikki" I want to know your last name. Writing everything down. Even though the medications are sitting on patients bedside table and she is refusing to take them.

## 2018-03-24 NOTE — ED Notes (Signed)
TTS device at bedside, assessment underway.  

## 2018-03-24 NOTE — ED Notes (Signed)
Regular Diet was ordered for Lunch. 

## 2018-03-24 NOTE — ED Triage Notes (Signed)
TC to Sheriff for transport . 

## 2018-03-24 NOTE — ED Notes (Signed)
Pt with manic behavior, repeatedly asking questions to staff about rules and procedures. States to me she only slept 1.5 hours last night. Burna FortsJeff Hedges aware.

## 2018-03-24 NOTE — BH Assessment (Signed)
[facility] Assessment Progress Note    TTS and FNP attempted to re-assess patient, but she had just been chemically sedated and was sleeping.  FNP spoke with patient's nurse for a summation of patient's current behavior in order to make medication recommendations.  Due to patient's current behavior, patient continues to be in need of inpatient treatment.

## 2018-03-24 NOTE — Progress Notes (Addendum)
Pt accepted [facility]  Lauren RedLeah Fryml, MD is the accepting/attending provider.  Call report to [phone]  [name] @ [facility] notified.   Pt is IVC   Pt may be transported by [employer] Pt scheduled  to [facility] as soon as transport can be arranged.  Timmothy EulerJean T. Kaylyn LimSutter, MSW, LCSWA Disposition Clinical Social Work [phone] (cell) [phone] (office)  CSW faxed [facility]C [facility].   .

## 2018-03-24 NOTE — BH Assessment (Signed)
Tele Assessment Note   Patient Name: Lauren Huffman MRN: [id number] Referring Physician: Dr. Baxter Hire Ward Location of Patient: [facility] Location of Provider: Behavioral Health TTS Department  [name] HADEN CAVENAUGH is an 62 y.o. female.  Patient is very manic.  She talks constantly and uses phrases such as "God is telling us" or "God wants [place] to write this down."  If there is something that is contrary to what she want, she says "that is the devil speaking."  She gives direction to her daughter and chaplain Diamond Nickel who are present during assessment.  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  Patient says she has not gotten any sleep since April.  She says she will sleep about 90 minutes during a 24 hour period.  Patient has religious fixation.  She says she sees her deceased father and hears voices of other people.  She says she has been depressed "since birth."  She says that this depression has worsened since she had surgery on left hip on 03/02/18.  Pt has also not been eating well lately and she says she has lost 70 lbs   Patient says she has no SI, no intention of harming others or herself.  No hx of suicide attempts.    Pt identifies complications from her hip surgery has her primary stressor. She states her leg is swollen and sore and she has been experiencing urinary incontinence. Pt says she has been walking with a walker at home but since her leg has become swollen she feels she needs a wheelchair. 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 Pt lives with her husband and daughter and identifies them and her son, a Hydrographic surveyor, as her primary supports. She denies legal problems.  Patient is followed by [name], NP at [facility].  She says she is taking medications as prescribed.  Pt has not had any inpatient psychiatric  treatment.  She did go by [facility] for [facility] but they did not have program that she wanted to participate in, this was on 12/09.  -Clinician discussed patient care with Donell Sievert, PA.  He recommends inpatient psychiatric care.  Clinician talked to Dr. Elesa Massed and she has placed patient on IVC.  TTS to seek placement.  Diagnosis: F33.3 MDD recurrent, severe w/ psychosis  Past Medical History:  Past Medical History:  Diagnosis Date  . Aneurysm, lower extremity (HCC)   . Anxiety   . Arthritis   . Carpal tunnel syndrome    Right  . Chronic pain   . Depression   . GERD (gastroesophageal reflux disease)   . Headache(784.0)   . Hiatal hernia   . History of colon polyps   . History of iron deficiency   . Hyperlipemia   . Hypertension   . Left arm pain   . Memory difficulty   . Mental disorder   . OSA (obstructive sleep apnea)   . Osteoarthritis   . Sleep apnea    DOES NOT WEAR CPAP    . Sleep disturbance   . Status post ablation of incompetent vein using laser 2x in Nov. 2017   right  . SVT (supraventricular tachycardia) (HCC)   . TMJ (dislocation of temporomandibular joint)   . Varicose veins   . Vitamin D insufficiency     Past Surgical History:  Procedure Laterality Date  . CYST REMOVAL HAND Left   . JOINT REPLACEMENT Right    knee  . KNEE ARTHROPLASTY  12/25/2011   Procedure: COMPUTER ASSISTED TOTAL KNEE ARTHROPLASTY;  Surgeon: [name], MD;  Location: [facility];  Service: Orthopedics;  Laterality: Right;  Right total knee replacement, general anesthesia, femoral nerve block  . KNEE ARTHROPLASTY Left 07/08/2012   Procedure: COMPUTER ASSISTED TOTAL KNEE ARTHROPLASTY;  Surgeon: [name], MD;  Location: [facility];  Service: Orthopedics;  Laterality: Left;  PRE OP FEMORAL NERVE BLOCK  . lazer vein surgery  2017   right leg  . TOTAL HIP ARTHROPLASTY Right 03/27/2016  . TOTAL HIP ARTHROPLASTY Right 03/27/2016   Procedure: TOTAL HIP ARTHROPLASTY ANTERIOR APPROACH;   Surgeon: [name], MD;  Location: [facility];  Service: Orthopedics;  Laterality: Right;  . TOTAL KNEE ARTHROPLASTY Left 07/08/2012   [name]    Family History:  Family History  Problem Relation Age of Onset  . Stroke Mother   . Seizures Mother   . Memory loss Mother   . CAD Mother        had a stent  . Hyperlipidemia Mother   . Cancer Father        Salivary Gland   . Heart disease Father        2 silent MI's  . Supraventricular tachycardia Sister   . Hyperlipidemia Sister     Social History:  reports that she has never smoked. She has never used smokeless tobacco. She reports previous alcohol use. She reports that she does not use drugs.  Additional Social History:  Alcohol / Drug Use Pain Medications: See PTA medication list Prescriptions: See PTA medication list Over the Counter: see PTA Medication list. History of alcohol / drug use?: No history of alcohol / drug abuse  CIWA: CIWA-Ar BP: (!) 145/99 Pulse Rate: 91 COWS:    Allergies:  Allergies  Allergen Reactions  . Cartia Xt [Diltiazem Hcl Er Beads] Other (See Comments)    Increases depression  . Gabapentin Other (See Comments)    Weakness and muscle contractions  . Metoprolol Other (See Comments)    INCREASED DEPRESSION   . Sulfa Antibiotics Other (See Comments)    Family History    Home Medications: (Not in a hospital admission)   OB/GYN Status:  No LMP recorded. Patient is postmenopausal.  General Assessment Data Location of Assessment: [facility] ED TTS Assessment: In system Is this a Tele or Face-to-Face Assessment?: Tele Assessment Is this an Initial Assessment or a Re-assessment for this encounter?: Initial Assessment Patient Accompanied by:: Other(Husband and daughter and chaplain ([employer]).) Language Other than English: No Living Arrangements: Other (Comment)(With husband) What gender do you identify as?: Female Marital status: Married [place] name: Mosley Pregnancy Status: No Living Arrangements:  Spouse/significant other, Children Can pt return to current living arrangement?: Yes Admission Status: Voluntary Is patient capable of signing voluntary admission?: Yes Referral Source: Self/Family/Friend(EMS brought pateint from church) Insurance type: BC/BS     Crisis Care Plan Living Arrangements: Spouse/significant other, Children Name of Psychiatrist: Robin Bridges/Dr. Wynonia Lawman Name of Therapist: None  Education Status Is patient currently in school?: No Is the patient employed, unemployed or receiving disability?: Employed  Risk to self with the past 6 months Suicidal Ideation: No Has patient been a risk to self within the past 6 months prior to admission? : No Suicidal Intent: No Has patient had any suicidal intent within the past 6 months prior to admission? :  No Is patient at risk for suicide?: No Suicidal Plan?: No Has patient had any suicidal plan within the past 6 months prior to admission? : No Access to Means: No What has been your use of drugs/alcohol within the last 12 months?: Denies Previous Attempts/Gestures: No How many times?: 0 Other Self Harm Risks: None Triggers for Past Attempts: None known Intentional Self Injurious Behavior: None Family Suicide History: Yes Recent stressful life event(s): Other (Comment)(Recent hip surgery; sleeplessness started in 07/2017) Persecutory voices/beliefs?: Yes Depression: Yes Depression Symptoms: Despondent, Loss of interest in usual pleasures, Feeling worthless/self pity, Insomnia Substance abuse history and/or treatment for substance abuse?: No Suicide prevention information given to non-admitted patients: Not applicable  Risk to Others within the past 6 months Homicidal Ideation: No Does patient have any lifetime risk of violence toward others beyond the six months prior to admission? : No Thoughts of Harm to Others: No Current Homicidal Intent: No Current Homicidal Plan: No Access to Homicidal Means:  No Identified Victim: No one History of harm to others?: No Assessment of Violence: None Noted Violent Behavior Description: None reported Does patient have access to weapons?: Yes (Comment)(Rifle is secured, pt does not have access.) Criminal Charges Pending?: No Does patient have a court date: No Is patient on probation?: No  Psychosis Hallucinations: Auditory, Visual(Sees her father talking to her.) Delusions: Persecutory  Mental Status Report Appearance/Hygiene: In hospital gown Eye Contact: Fair Motor Activity: Freedom of movement, Unsteady Speech: Pressured, Tangential Level of Consciousness: Alert Mood: Depressed, Anxious, Helpless Affect: Anxious, Preoccupied Anxiety Level: Severe Thought Processes: Irrelevant, Tangential, Flight of Ideas Judgement: Impaired Orientation: Person, Place, Situation Obsessive Compulsive Thoughts/Behaviors: None  Cognitive Functioning Concentration: Decreased Memory: Recent Impaired, Remote Intact Is patient IDD: No Insight: Fair Impulse Control: Poor Appetite: Fair Have you had any weight changes? : Loss Amount of the weight change? (lbs): 70 lbs Sleep: Decreased Total Hours of Sleep: (<4H/D) Vegetative Symptoms: None  ADLScreening [facility] Assessment Services) Patient's cognitive ability adequate to safely complete daily activities?: Yes Patient able to express need for assistance with ADLs?: Yes Independently performs ADLs?: No  Prior Inpatient Therapy Prior Inpatient Therapy: No  Prior Outpatient Therapy Prior Outpatient Therapy: Yes Prior Therapy Dates: ongoing Prior Therapy Facilty/Provider(s): Donnie Ahoobin Bridges, NP Reason for Treatment: Depression Does patient have an ACCT team?: No Does patient have Intensive In-House Services?  : No Does patient have [facility] services? : No Does patient have P4CC services?: No  ADL Screening (condition at time of admission) Patient's cognitive ability adequate to safely complete daily  activities?: Yes Is the patient deaf or have difficulty hearing?: No Does the patient have difficulty seeing, even when wearing glasses/contacts?: No(Pt wears glasses.) Does the patient have difficulty concentrating, remembering, or making decisions?: Yes Patient able to express need for assistance with ADLs?: Yes Does the patient have difficulty dressing or bathing?: Yes Independently performs ADLs?: No Communication: Independent Dressing (OT): Independent(Left foot shoes must be put on.) Grooming: Independent Feeding: Independent Bathing: Independent Toileting: Independent In/Out Bed: Dependent(Left hip pain and eakness) Is this a change from baseline?: Pre-admission baseline Walks in Home: Dependent(Uses a walker.) Does the patient have difficulty walking or climbing stairs?: Yes(Recent left hip surgery.) Weakness of Legs: Left Weakness of Arms/Hands: None       Abuse/Neglect Assessment (Assessment to be complete while patient is alone) Physical Abuse: Denies Verbal Abuse: Denies Sexual Abuse: Denies Exploitation of patient/patient's resources: Denies Self-Neglect: Denies     Merchant navy officerAdvance Directives (For Healthcare) Does Patient Have a Medical  Advance Directive?: No Would patient like information on creating a medical advance directive?: No - Patient declined          Disposition:  Disposition Initial Assessment Completed for this Encounter: Yes Patient referred to: Other (Comment)(To be reviewed with PA)  This service was provided via telemedicine using a 2-way, interactive audio and video technology.  Names of all persons participating in this telemedicine service and their role in this encounter. Name: Audie BoxJan Renfrew Role: patient  Name: Diamond NickelBeatrice Role: chaplain  Name: daughter Role:   Name:  Role:     Alexandria LodgeHarvey, Trina Asch Ray 03/24/2018 1:19 AM

## 2018-03-24 NOTE — ED Triage Notes (Signed)
[facility] .

## 2018-03-24 NOTE — ED Triage Notes (Signed)
Pt waiting for [facility].

## 2018-03-24 NOTE — Consult Note (Addendum)
  Tele psych Assessment   Lauren FalconJan M Huffman, 62 y.o., female patient seen via tele psych by TTS and this provider; chart reviewed and consulted with Dr. Lucianne MussKumar on 03/24/18.  Unable to do tele assessment related to Lauren Huffman being sleep.  Spoke with nurse of patient Lauren Amoricole Kohut, RN who reports that patient has been in and out of room being very demanding; asking repetitive questions; very paranoid refusing to take her medications.  Patient was given Geodon and is now sleeping after only sleeping 1.5 hours last since yesterday.   States that EDP also requesting medication for patient.    Review or current psychotropic medications Wellbutrin XL 450 mg daily Celexa 40 mg daily Remeron 7.5 mg Q hs Trazodone 100 mg Q hs Ativan 1 mg Q 8 hr prn anxiety, sleep  For detailed note see TTS tele assessment note  Recommendations: EKG baseline and rule out QT prolongation.  Start Risperdal 0.5 mg Bid and Cogentin 0.5 mg Bid.  Can be ordered IM or PO  Disposition:    Recommend psychiatric Inpatient admission when medically cleared.  Spoke with Dr. Ranae PalmsYelverton informed of above recommendation and disposition  Lauren Wurtz, NP   Changed Risperdal to Zyprexa related to Risperdal did not come in an injection other than consta.  Discontinued Cogentin also

## 2018-03-24 NOTE — ED Notes (Signed)
Pt continuing to state she will not talk until everything is written down. Pt was given a pen and a paper to write down. Pt started to write different information about [facility] that have [facility], pt also started to write information about food times and believes she was not told about visiting hours. RN gave a copy of times of visiting hours and food times. Pt not happy about why the food is delivered as such. RN explained process depends on different variables. Pt states that everything to be transcribed by the chaplin and only the chaplin or it should be written down. RN explained things can be writted down but will not have a chaplin to this for her. Pt stating" the devil is running through her. I only get 90 minutes of sleep. Then use the bathroom then can sleep a little more."

## 2018-03-24 NOTE — Progress Notes (Signed)
Pt. meets criteria for inpatient treatment per Assunta FoundShuvon Rankin, NP.  Referred out to the following hospitals: [facility]  [facility]  [facility]  [facility]  [facility]     Disposition CSW will continue to follow for placement.  Timmothy EulerJean T. Kaylyn LimSutter, MSW, LCSWA Disposition Clinical Social Work [phone] (cell) [phone] (office)

## 2018-03-24 NOTE — ED Notes (Signed)
Pt approached by this EMT and female officer to change into scrubs and remove remaining belongings. Pt informed of process, including that family was no longer able to be with her. Pt then began getting agitated. Pt stated she was still in charge since she was a pt and was informed by this EMT and [employer] she had been IVCd so she had to follow our instructions. Pt began yelling for family, the chaplain, Theda BelfastBob Hamilton, and other misc people. Pt stated she would not be talking anymore that evening until she sees the people formerly requested. Pt informed she didn't need to talk but she was going to be changed into scrubs by staff and moved to Purple Zone until further decisions made by TTS.   Pt assisted by this EMT, [employer] female officer, and Diplomatic Services operational officerAngela RN to change into purple scrubs. Pt became agitated after bra and gown removed, refusing to stand or let anyone take off her pants. Pt informed that if she was not complaint, she the pants would be cut off. Pt continued to refuse to stand or allow staff to remove pants. Pants cut off by this EMT. Pt informed staff that she needed to use the bathroom. Pt then escorted to the bathroom across the hall by this EMT and the female officer. Marylene LandAngela, RN accompanied for assistance. Pt continued to request a walker but refused wheel chair. When wheel chair offered, pt became agitated and stated she was not going to be talking anymore or going anywhere. Pt assisted to stand and proceeded to walk, despite shouts of being too heavy and feeling as though she had no rights. Pt sat on the toilet for approximately 10 minutes prior to deciding she no longer needed to go.   Pt assisted back to stretcher. Throughout the duration of the encounter, pt went between incoherent religious speech, often stating "God has a hand in ___, that is too funny." In conjunction with statements unrelated to previous statement progression. Pt also became frequently agitated and yelling at staff but then would  return to being calm again. [employer] present throughout entire encounter both directly with the pt and from a slight distance in the hallway.

## 2018-03-24 NOTE — ED Notes (Addendum)
Pt is ambulatory to the bathroom, provided urine sample to this EMT, and was compliant with request for EKG. Pt continued to tell this EMT to write down different things due to them being important. Pt informed she has paper and pen in her possession at this time and can write or "record" things as she pleases.

## 2018-03-24 NOTE — ED Notes (Signed)
Pt belongings sent home with husband and daughter. Pt remains agitated and cooperative with much effort at time. Requesting repeatedly for someone "to transcribe some stuff", commenting that "the Lord's speaking."   Changed into paper scrubs, wanded by Security, and escorted to Purple Zone by wheelchair. IVC papers faxed back by Magistrate and signed in completion by [employer]. Discussed psych hold rules & guidelines, which pt signed & dated, then scribbled through.

## 2018-03-24 NOTE — ED Notes (Signed)
Pt refused all oral medications. Shouting, "I want someone to call Theda BelfastBob Hamilton down here right now." "These [place]"

## 2018-03-24 NOTE — ED Notes (Addendum)
Pt given a sandwich, crackers,  peanut butter, and juice via chaplain. Chaplain asked nursing staff about the plan, informed we were waiting on more information from MD and lab work to come back before plan developed.

## 2018-03-24 NOTE — ED Notes (Signed)
Pts husband at security desk requesting to visit patient. Instructed security that patient may have visitors tomorrow. Husband then called me stating he wanted to speak with the doctor or supervisor. I spoke with Dr. Ranae PalmsYelverton who stated visiting hours were at my discretion based on the patients behavior. I called the husband back and explained to him, the situation, that we just had to medicate the patient because she refused all of her medications, the patient is being demanding and asking repetitive questions, only having slept 1.5hours. Refusing to go to bed. I explained after the medication works and the patient is able to sleep she may be able to have visitors this evening if she remains calm, and cooperative. Pts husband agreeable to plan.

## 2018-03-24 NOTE — Progress Notes (Signed)
Chaplain responded to a Page to the ED to offer and provide emotional support to Pt. Husband and daughter at bedside when Chaplain arrived. Chaplain was asked to stay in the room [facility]. Chaplain sat with Pt and just listened.    Pastoral department here if needed.

## 2018-03-24 NOTE — ED Provider Notes (Signed)
62 year old female under IVC with a past medical history of anxiety hypertension, hyperlipidemia agitation and psychosis.  This is patient's second visit in the last month.  She was IVC yesterday, she is having religious preservations, extreme agitation.  Nursing staff made me aware of patient's behavior, she has not been sleeping, she keeps attempting to leave the room and is demanding to nursing staff.  She has rapid tangential pressured speech.  In order to maintain safety for nursing staff and patient I have ordered Geodon IM.   Vitals:   03/24/18 0130 03/24/18 0549  BP: (!) 121/106 (!) 149/91  Pulse: 96 96  Resp:  18  Temp:  98 F (36.7 C)  SpO2: 98% 97%     Eyvonne MechanicHedges, Rahiem Schellinger, PA-C 03/24/18 [id number]    Shaune PollackIsaacs, Cameron, MD 03/25/18 [phone]

## 2018-03-24 NOTE — ED Triage Notes (Signed)
PT ambulated in hall with walker as requested . Sitter walked with Pt as requested by PT.

## 2018-04-21 ENCOUNTER — Other Ambulatory Visit: Payer: Self-pay | Admitting: Family Medicine

## 2018-04-21 DIAGNOSIS — E2839 Other primary ovarian failure: Secondary | ICD-10-CM

## 2018-04-21 DIAGNOSIS — Z1231 Encounter for screening mammogram for malignant neoplasm of breast: Secondary | ICD-10-CM

## 2018-04-25 ENCOUNTER — Ambulatory Visit: Payer: BC Managed Care – PPO | Admitting: Neurology

## 2018-06-09 ENCOUNTER — Encounter: Payer: Self-pay | Admitting: Neurology

## 2018-06-09 ENCOUNTER — Ambulatory Visit: Payer: BC Managed Care – PPO | Admitting: Neurology

## 2018-06-09 VITALS — BP 152/92 | HR 81 | Ht 71.0 in | Wt 222.0 lb

## 2018-06-09 DIAGNOSIS — R413 Other amnesia: Secondary | ICD-10-CM

## 2018-06-09 NOTE — Progress Notes (Signed)
**Note De-Identified Lauren Obfuscation** PATIENT: Lauren Huffman DOB: 1955-11-17  Chief Complaint  Patient presents with  . Memory Loss    MMSE 29/30 - 11 animals. She is here with her daughter.  Feels her memory is stable.  She would like to review her MRI.      HISTORICAL  Lauren Huffman is a 63 year old female, seen in request by her primary care physician Dr. Gweneth Dimitri, her memory loss, she is accompanied by her daughter Lauren Huffman daughter Lauren Huffman at today's clinical visit, initial evaluation was on January 06 2018.  I have reviewed and summarized the referring note from the referring physician.   She had a past medical history of depression, right carpal tunnel syndromes, obstructive sleep apnea, but could not tolerate CPAP machine, osteoarthritis, bilateral knee replacement, I saw her in the past for left shoulder, elbow pain in 2016, overall has much improved  Today she comes in for personality change, memory loss  She reported long history of depression, over the years, is on polypharmacy treatment, Wellbutrin 150 mg 3 tablets a day, Celexa 40 mg a day, trazodone 50 mg 2 tablets every night,  She suffered SVT cardiac event in fall 2018, was given Cardizem, and daily aspirin, concurrent with her worsening health status, left hip pain, pending left hip surgery, she suffered worsening depression,  She was started on Latuda since beginning of 2019, she was on it for 6 months, during that period of time, she was noted to have sudden worsening of memory loss, withdrawal from her busy social interaction, she would not talk with people, barely leave her house, no longer enthusiastic about the responsibility of visiting her mother regularly, sometimes she is staying in bed until 2 PM, missing her meals.  After Lauren Huffman was stopped a few weeks ago, there was no significant change in her depression, there might small improvement in her excessive drowsiness, fatigue,  She was noted to have gradual onset memory loss since her left hip  replacement in December 2017, after going through general anesthesia, there was mild improvement at following months, with her pending elective left hip surgery soon, family wants to do neurological investigation, make sure she is safe to go through general anesthesia and elective surgery.  Laboratory evaluations in July 2019, normal CMP, creatinine of 0.76, CBC with hemoglobin of 14.3, normal TSH 1.62  UPDATE March 5th 2020: She has left hip replacement in Nov 2019, recovering well, he continue has mild gait abnormality due to bilateral foot, ankle problem,  She had a manic episode following her left hip replacement in December 2019 requiring hospitalization, now has much stabilized now  We personally reviewed MRI of the brain without contrast on January 20, 2018 that was normal.   REVIEW OF SYSTEMS: Full 14 system review of systems performed and notable only for as above All other review of systems were negative.  ALLERGIES: Allergies  Allergen Reactions  . Cartia Xt [Diltiazem Hcl Er Beads] Other (See Comments)    Increases depression  . Gabapentin Other (See Comments)    Weakness and muscle contractions  . Metoprolol Other (See Comments)    INCREASED DEPRESSION   . Sulfa Antibiotics Other (See Comments)    Family History    HOME MEDICATIONS: Current Outpatient Medications  Medication Sig Dispense Refill  . aspirin 325 MG tablet Take 325 mg by mouth 2 (two) times daily.    Marland Kitchen BIOTIN PO Take by mouth daily.    . Ca Phosphate-Cholecalciferol (EQL CALCIUM GUMMIES PO) Take 2  tablets by mouth daily.    . Cholecalciferol (VITAMIN D3) 25 MCG (1000 UT) CAPS Take 2,000 Units by mouth daily.     . citalopram (CELEXA) 40 MG tablet Take 20 mg by mouth every morning.     . Cyanocobalamin (VITAMIN B-12) 2500 MCG SUBL Place 2,500 mcg under the tongue daily.    . diclofenac sodium (VOLTAREN) 1 % GEL Apply 4 g topically 2 (two) times daily.    . divalproex (DEPAKOTE ER) 500 MG 24 hr tablet TK  1 T PO BID    . docusate sodium (COLACE) 100 MG capsule Take 100 mg by mouth 2 (two) times daily.    Marland Kitchen ELDERBERRY PO Take by mouth daily.    . ferrous sulfate 325 (65 FE) MG tablet Take 325 mg by mouth daily with breakfast.    . folic acid (FOLVITE) 1 MG tablet Take 2 mg by mouth daily.     Marland Kitchen loratadine (CLARITIN) 10 MG tablet Take 10 mg by mouth daily.    . meloxicam (MOBIC) 15 MG tablet Take 15 mg by mouth daily.    . methocarbamol (ROBAXIN) 750 MG tablet Take 750 mg by mouth every 8 (eight) hours.  0  . mirtazapine (REMERON) 7.5 MG tablet Take 1 tablet (7.5 mg total) by mouth at bedtime. 30 tablet 0  . Multiple Vitamins-Minerals (MULTIVITAMIN GUMMIES ADULT PO) Take 1 tablet by mouth daily.     . Omega-3 Fatty Acids (FISH OIL PO) Take by mouth daily.    Marland Kitchen omeprazole (PRILOSEC) 40 MG capsule Take 40 mg by mouth 2 (two) times daily.   11  . Probiotic Product (ALIGN) CHEW Chew 1 tablet by mouth daily.    . risperiDONE (RISPERDAL) 0.5 MG tablet TK 1 T PO  QHS    . Simethicone (GAS-X PO) Take 1 tablet by mouth 3 (three) times daily with meals.     . TURMERIC PO Take 1,075 mg by mouth daily.    . vitamin C (ASCORBIC ACID) 500 MG tablet Take 500 mg by mouth daily.    . Vitamin D, Ergocalciferol, (DRISDOL) 1.25 MG (50000 UT) CAPS capsule Take 50,000 Units by mouth every 7 (seven) days.   0   No current facility-administered medications for this visit.     PAST MEDICAL HISTORY: Past Medical History:  Diagnosis Date  . Aneurysm, lower extremity (HCC)   . Anxiety   . Arthritis   . Carpal tunnel syndrome    Right  . Chronic pain   . Depression   . GERD (gastroesophageal reflux disease)   . Headache(784.0)   . Hiatal hernia   . History of colon polyps   . History of iron deficiency   . Hyperlipemia   . Hypertension   . Left arm pain   . Memory difficulty   . Mental disorder   . OSA (obstructive sleep apnea)   . Osteoarthritis   . Sleep apnea    DOES NOT WEAR CPAP    . Sleep  disturbance   . Status post ablation of incompetent vein using laser 2x in Nov. 2017   right  . SVT (supraventricular tachycardia) (HCC)   . TMJ (dislocation of temporomandibular joint)   . Varicose veins   . Vitamin D insufficiency     PAST SURGICAL HISTORY: Past Surgical History:  Procedure Laterality Date  . CYST REMOVAL HAND Left   . JOINT REPLACEMENT Right    knee  . KNEE ARTHROPLASTY  12/25/2011   Procedure: COMPUTER  ASSISTED TOTAL KNEE ARTHROPLASTY;  Surgeon: [name], MD;  Location: [facility];  Service: Orthopedics;  Laterality: Right;  Right total knee replacement, general anesthesia, femoral nerve block  . KNEE ARTHROPLASTY Left 07/08/2012   Procedure: COMPUTER ASSISTED TOTAL KNEE ARTHROPLASTY;  Surgeon: [name], MD;  Location: [facility];  Service: Orthopedics;  Laterality: Left;  PRE OP FEMORAL NERVE BLOCK  . lazer vein surgery  2017   right leg  . TOTAL HIP ARTHROPLASTY Right 03/27/2016  . TOTAL HIP ARTHROPLASTY Right 03/27/2016   Procedure: TOTAL HIP ARTHROPLASTY ANTERIOR APPROACH;  Surgeon: [name], MD;  Location: [facility];  Service: Orthopedics;  Laterality: Right;  . TOTAL KNEE ARTHROPLASTY Left 07/08/2012   [name]    FAMILY HISTORY: Family History  Problem Relation Age of Onset  . Stroke Mother   . Seizures Mother   . Memory loss Mother   . CAD Mother        had a stent  . Hyperlipidemia Mother   . Cancer Father        Salivary Gland   . Heart disease Father        2 silent MI's  . Supraventricular tachycardia Sister   . Hyperlipidemia Sister     SOCIAL HISTORY: Social History   Socioeconomic History  . Marital status: Married    Spouse name: Not on file  . Number of children: 2  . Years of education: Masters  . Highest education level: Not on file  Occupational History  . 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 Financial resource strain: Not on file  . Food insecurity:    Worry: Not on file    Inability: Not on file  .  Transportation needs:    Medical: Not on file    Non-medical: Not on file  Tobacco Use  . Smoking status: Never Smoker  . Smokeless tobacco: Never Used  Substance and Sexual Activity  . Alcohol use: Not Currently  . Drug use: No  . Sexual activity: Not on file  Lifestyle  . Physical activity:    Days per week: Not on file    Minutes per session: Not on file  . Stress: Not on file  Relationships  . Social connections:    Talks on phone: Not on file    Gets together: Not on file    Attends religious service: Not on file    Active member of club or organization: Not on file    Attends meetings of clubs or organizations: Not on file    Relationship status: Not on file  . Intimate partner violence:    Fear of current or ex partner: Not on file    Emotionally abused: Not on file    Physically abused: Not on file    Forced sexual activity: Not on file  Other Topics Concern  . Not on file  Social History Narrative   Lives at home with husband.   No daily caffeine use.   Right-handed.     PHYSICAL EXAM   Vitals:   06/09/18 1337  BP: (!) 152/92  Pulse: 81  Weight: 222 lb (100.7 kg)  Height: 5\' 11"  (1.803 m)    Not recorded      Body mass index is 30.96 kg/m.  PHYSICAL EXAMNIATION:  Gen: NAD, conversant, well nourised, obese, well groomed                     Cardiovascular: Regular rate  rhythm, no peripheral edema, warm, nontender. Eyes: Conjunctivae clear without exudates or hemorrhage Neck: Supple, no carotid bruits. Pulmonary: Clear to auscultation bilaterally   NEUROLOGICAL EXAM: MMSE - Mini Mental State Exam 06/09/2018 01/06/2018  Orientation to time 4 5  Orientation to Place 5 5  Registration 3 3  Attention/ Calculation 5 5  Recall 3 3  Language- name 2 objects 2 2  Language- repeat 1 1  Language- follow 3 step command 3 3  Language- read & follow direction 1 1  Write a sentence 1 1  Copy design 1 1  Total score 29 30  Animal naming 11  CRANIAL  NERVES: CN II: Visual fields are full to confrontation. Pupils are round equal and briskly reactive to light. CN III, IV, VI: extraocular movement are normal. No ptosis. CN V: Facial sensation is intact to pinprick in all 3 divisions bilaterally. Corneal responses are intact.  CN VII: Face is symmetric with normal eye closure and smile. CN VIII: Hearing is normal to rubbing fingers CN IX, X: Palate elevates symmetrically. Phonation is normal. CN XI: Head turning and shoulder shrug are intact CN XII: Tongue is midline with normal movements and no atrophy.  MOTOR: There is no pronator drift of out-stretched arms. Muscle bulk and tone are normal. Muscle strength is normal.  REFLEXES: Reflexes are hypoactive and symmetric at the biceps, triceps, knees, and ankles. Plantar responses are flexor.  SENSORY: Intact to light touch, pinprick, positional sensation and vibratory sensation are intact in fingers and toes.  COORDINATION: Rapid alternating movements and fine finger movements are intact. There is no dysmetria on finger-to-nose and heel-knee-shin.    GAIT/STANCE: She needs pushed up to get up from seated position, toes was pointed out, mildly unsteady   DIAGNOSTIC DATA (LABS, IMAGING, TESTING) - I reviewed patient records, labs, notes, testing and imaging myself where available.   ASSESSMENT AND PLAN  Lauren Huffman is a 63 y.o. female    Mild cognitive impairment   MRI of brain was normal.  Most related to her mood disorder and polypharmacy treatment   Encouraged her moderate exercise   Lauren Huffman, M.D. Ph.D.  [facility] [address], [address] [place], [place] [address] Ph: [phone] Fax: [phone]  CC:  Gweneth Dimitri, MD

## 2018-07-11 ENCOUNTER — Ambulatory Visit: Payer: Self-pay

## 2018-07-11 ENCOUNTER — Other Ambulatory Visit: Payer: Self-pay

## 2018-12-27 ENCOUNTER — Other Ambulatory Visit: Payer: Self-pay

## 2018-12-27 DIAGNOSIS — Z20822 Contact with and (suspected) exposure to covid-19: Secondary | ICD-10-CM

## 2018-12-29 LAB — NOVEL CORONAVIRUS, NAA: SARS-CoV-2, NAA: NOT DETECTED

## 2019-02-05 ENCOUNTER — Other Ambulatory Visit: Payer: Self-pay | Admitting: Family Medicine

## 2019-02-05 ENCOUNTER — Other Ambulatory Visit (HOSPITAL_COMMUNITY)
Admission: RE | Admit: 2019-02-05 | Discharge: 2019-02-05 | Disposition: A | Discharge status: Home / Self Care | Payer: BC Managed Care – PPO | Source: Ambulatory Visit | Attending: Family Medicine | Admitting: Family Medicine

## 2019-02-05 DIAGNOSIS — Z124 Encounter for screening for malignant neoplasm of cervix: Secondary | ICD-10-CM | POA: Diagnosis present

## 2019-02-09 LAB — CYTOLOGY - PAP
Comment: NEGATIVE
Diagnosis: NEGATIVE
High risk HPV: NEGATIVE

## 2019-02-23 ENCOUNTER — Other Ambulatory Visit: Payer: Self-pay

## 2019-02-23 DIAGNOSIS — Z20822 Contact with and (suspected) exposure to covid-19: Secondary | ICD-10-CM

## 2019-02-25 LAB — NOVEL CORONAVIRUS, NAA: SARS-CoV-2, NAA: NOT DETECTED

## 2019-05-22 ENCOUNTER — Ambulatory Visit
Admission: RE | Admit: 2019-05-22 | Discharge: 2019-05-22 | Disposition: A | Discharge status: Home / Self Care | Payer: BC Managed Care – PPO | Source: Ambulatory Visit | Attending: Family Medicine | Admitting: Family Medicine

## 2019-05-22 ENCOUNTER — Other Ambulatory Visit: Payer: Self-pay

## 2019-05-22 DIAGNOSIS — Z1231 Encounter for screening mammogram for malignant neoplasm of breast: Secondary | ICD-10-CM

## 2019-05-22 DIAGNOSIS — E2839 Other primary ovarian failure: Secondary | ICD-10-CM

## 2019-08-22 ENCOUNTER — Ambulatory Visit: Payer: Medicare Other | Admitting: Cardiology

## 2019-09-02 IMAGING — DX DG CHEST 1V PORT
1 series · 1 of 1 positions shown · non-contrast
Comparison: 03/23/2016

CLINICAL DATA: Manic episode tonight. Recent hip surgery.

EXAM:
PORTABLE CHEST 1 VIEW

[chest ap]
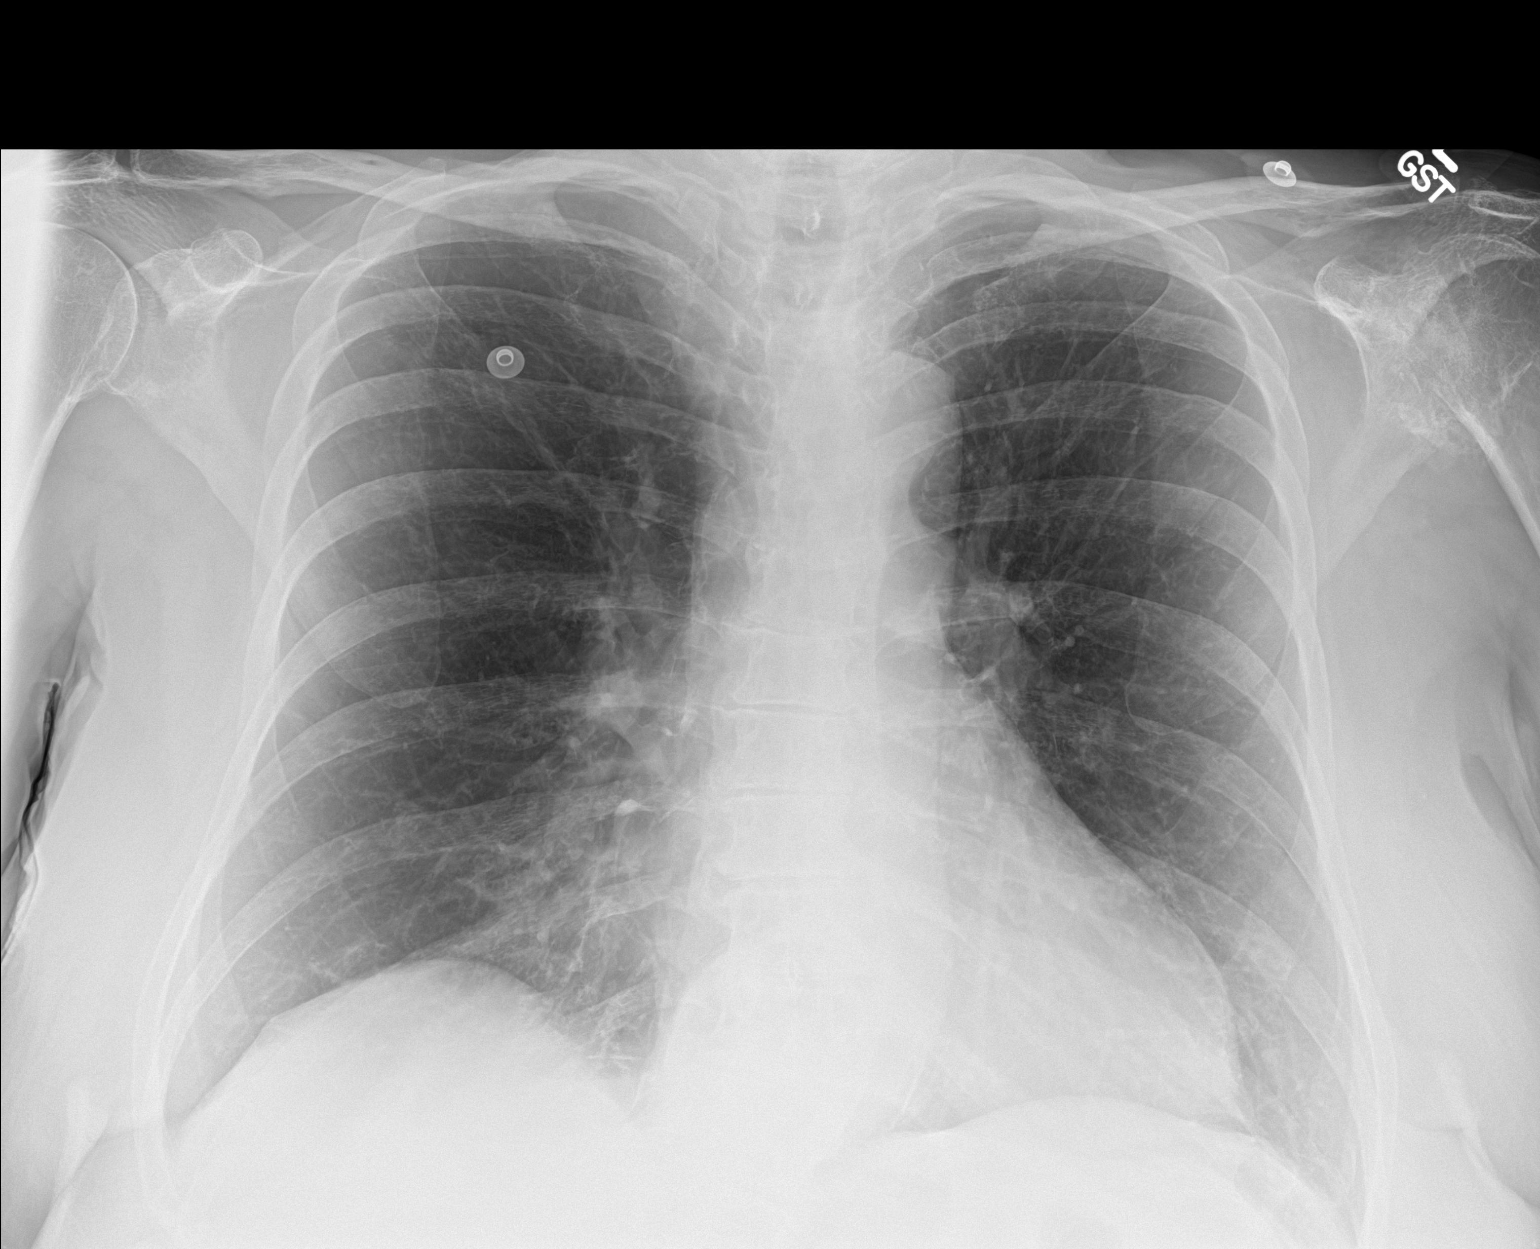

[1 of 1 positions shown; findings below may reference images not displayed]

FINDINGS: Heart size and pulmonary vascularity are normal. Increased density
in the right cardiophrenic angle region likely due to pectus
excavatum. No evidence of airspace disease or consolidation in the
lungs. No blunting of costophrenic angles. No pneumothorax.
Mediastinal contours appear intact. Severe degenerative changes in
the left shoulder.
IMPRESSION: No active disease.

## 2019-09-22 ENCOUNTER — Telehealth: Payer: Self-pay | Admitting: Physician Assistant

## 2019-09-22 NOTE — Telephone Encounter (Signed)
Spoke with the patient and advised her that we were not allowing patients to have visitor unless there is a physical or mental disability that requires patient to have assistance. Advised patient that she could have someone on the phone during the visit if she would like. Patient verbalized understanding and thanked me for the call.

## 2019-09-22 NOTE — Telephone Encounter (Signed)
    Pt asking if she can bring her daughter or friend to her appt on 10/04/19, she said they need someone to be there with her to help understand information and be an extra pair of ears.  Please advise

## 2019-10-02 NOTE — Progress Notes (Deleted)
Cardiology Office Note    Date:  10/02/2019   ID:  Siara, Gorder 10-Feb-1956, MRN [id number]  PCP:  Cari Caraway, MD  Cardiologist: Fransico Him, MD EPS: None  No chief complaint on file.   History of Present Illness:  Lauren Huffman is a 64 y.o. female with history of hypertension, HLD, family history of CAD, OSA unable to tolerate CPAP, anxiety, SVT 2018 treated with adenosine.  Unable to tolerate CCB made her depression worse  In in our office by Truitt Merle, NP 02/21/2018 for preoperative clearance and was doing well.  Past Medical History:  Diagnosis Date  . Aneurysm, lower extremity ([place])   . Anxiety   . Arthritis   . Carpal tunnel syndrome    Right  . Chronic pain   . Depression   . GERD (gastroesophageal reflux disease)   . Headache(784.0)   . Hiatal hernia   . History of colon polyps   . History of iron deficiency   . Hyperlipemia   . Hypertension   . Left arm pain   . Memory difficulty   . Mental disorder   . OSA (obstructive sleep apnea)   . Osteoarthritis   . Sleep apnea    DOES NOT WEAR CPAP    . Sleep disturbance   . Status post ablation of incompetent vein using laser 2x in Nov. 2017   right  . SVT (supraventricular tachycardia) ([place])   . TMJ (dislocation of temporomandibular joint)   . Varicose veins   . Vitamin D insufficiency     Past Surgical History:  Procedure Laterality Date  . CYST REMOVAL HAND Left   . JOINT REPLACEMENT Right    knee  . KNEE ARTHROPLASTY  12/25/2011   Procedure: COMPUTER ASSISTED TOTAL KNEE ARTHROPLASTY;  Surgeon: Alta Corning, MD;  Location: [facility];  Service: Orthopedics;  Laterality: Right;  Right total knee replacement, general anesthesia, femoral nerve block  . KNEE ARTHROPLASTY Left 07/08/2012   Procedure: COMPUTER ASSISTED TOTAL KNEE ARTHROPLASTY;  Surgeon: Alta Corning, MD;  Location: [place] [facility] [place];  Service: Orthopedics;  Laterality: Left;  PRE OP FEMORAL NERVE BLOCK  . lazer vein surgery  2017   right leg  . TOTAL  HIP ARTHROPLASTY Right 03/27/2016  . TOTAL HIP ARTHROPLASTY Right 03/27/2016   Procedure: TOTAL HIP ARTHROPLASTY ANTERIOR APPROACH;  Surgeon: Dorna Leitz, MD;  Location: [facility];  Service: Orthopedics;  Laterality: Right;  . TOTAL KNEE ARTHROPLASTY Left 07/08/2012   Dr Berenice Primas    Current Medications: No outpatient medications have been marked as taking for the 10/04/19 encounter (Appointment) with Imogene Burn, PA-C.     Allergies:   Cartia xt [diltiazem hcl er beads], Gabapentin, Metoprolol, and Sulfa antibiotics   Social History   Socioeconomic History  . Marital status: Married    Spouse name: Not on file  . Number of children: 2  . Years of education: Masters  . Highest education level: Not on file  Occupational History  . 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 Smoking status: Never Smoker  . Smokeless tobacco: Never Used  Vaping Use  . Vaping Use: Never used  Substance and Sexual Activity  . Alcohol use: Not Currently  . Drug use: No  . Sexual activity: Not on file  Other Topics Concern  . Not on file  Social History Narrative   Lives at home with husband.   No daily caffeine use.   Right-handed.   Social Determinants  of Health   Financial Resource Strain:   . Difficulty of Paying Living Expenses:   Food Insecurity:   . Worried About Programme researcher, broadcasting/film/video in the Last Year:   . Barista in the Last Year:   Transportation Needs:   . Freight forwarder (Medical):   Marland Kitchen Lack of Transportation (Non-Medical):   Physical Activity:   . Days of Exercise per Week:   . Minutes of Exercise per Session:   Stress:   . Feeling of Stress :   Social Connections:   . Frequency of Communication with Friends and Family:   . Frequency of Social Gatherings with Friends and Family:   . Attends Religious Services:   . Active Member of Clubs or Organizations:   . Attends Banker Meetings:   Marland Kitchen Marital Status:      Family History:  The patient's  ***family history includes CAD in her mother; Cancer in her father; Heart disease in her father; Hyperlipidemia in her mother and sister; Memory loss in her mother; Seizures in her mother; Stroke in her mother; Supraventricular tachycardia in her sister.   ROS:   Please see the history of present illness.    ROS All other systems reviewed and are negative.   PHYSICAL EXAM:   VS:  There were no vitals taken for this visit.  Physical Exam  GEN: Well nourished, well developed, in no acute distress  HEENT: normal  Neck: no JVD, carotid bruits, or masses Cardiac:RRR; no murmurs, rubs, or gallops  Respiratory:  clear to auscultation bilaterally, normal work of breathing GI: soft, nontender, nondistended, + BS Ext: without cyanosis, clubbing, or edema, Good distal pulses bilaterally MS: no deformity or atrophy  Skin: warm and dry, no rash Neuro:  Alert and Oriented x 3, Strength and sensation are intact Psych: euthymic mood, full affect  Wt Readings from Last 3 Encounters:  06/09/18 222 lb (100.7 kg)  03/23/18 190 lb 11.2 oz (86.5 kg)  03/13/18 190 lb 11.2 oz (86.5 kg)      Studies/Labs Reviewed:   EKG:  EKG is*** ordered today.  The ekg ordered today demonstrates ***  Recent Labs: No results found for requested labs within last 8760 hours.   Lipid Panel No results found for: CHOL, TRIG, HDL, CHOLHDL, VLDL, LDLCALC, LDLDIRECT  Additional studies/ records that were reviewed today include:  Echo Study Conclusions 01/2017   - Left ventricle: The cavity size was normal. Wall thickness was   increased in a pattern of mild LVH. Systolic function was normal.   The estimated ejection fraction was in the range of 55% to 60%.   Wall motion was normal; there were no regional wall motion   abnormalities. Doppler parameters are consistent with abnormal   left ventricular relaxation (grade 1 diastolic dysfunction).   Doppler parameters are consistent with indeterminate ventricular    filling pressure. - Aortic valve: Transvalvular velocity was within the normal range.   There was no stenosis. There was no regurgitation. - Mitral valve: Transvalvular velocity was within the normal range.   There was no evidence for stenosis. There was mild regurgitation. - Left atrium: The atrium was mildly dilated. - Right ventricle: The cavity size was normal. Wall thickness was   normal. Systolic function was normal. - Atrial septum: No defect or patent foramen ovale was identified   by color flow Doppler. - Tricuspid valve: There was mild regurgitation. - Pulmonary arteries: Systolic pressure was at the upper limits of  normal. PA peak pressure: 39 mm Hg (S).         ASSESSMENT:    No diagnosis found.   PLAN:  In order of problems listed above:  History of SVT  Hypertension  RBBB  History of OSA could not tolerate CPAP  Hyperlipidemia  Family history of CAD    Medication Adjustments/Labs and Tests Ordered: Current medicines are reviewed at length with the patient today.  Concerns regarding medicines are outlined above.  Medication changes, Labs and Tests ordered today are listed in the Patient Instructions below. There are no Patient Instructions on file for this visit.   Elson Clan, PA-C  10/02/2019 3:33 PM    [facility] [address] [place], [place], [place]  [address] Phone: [phone]; Fax: [phone]

## 2019-10-03 ENCOUNTER — Encounter (INDEPENDENT_AMBULATORY_CARE_PROVIDER_SITE_OTHER): Payer: Self-pay | Admitting: Family Medicine

## 2019-10-03 ENCOUNTER — Ambulatory Visit (INDEPENDENT_AMBULATORY_CARE_PROVIDER_SITE_OTHER): Payer: Medicare Other | Admitting: Family Medicine

## 2019-10-03 ENCOUNTER — Other Ambulatory Visit: Payer: Self-pay

## 2019-10-03 VITALS — BP 118/83 | HR 91 | Temp 98.3°F | Ht 68.0 in | Wt 268.0 lb

## 2019-10-03 DIAGNOSIS — E559 Vitamin D deficiency, unspecified: Secondary | ICD-10-CM

## 2019-10-03 DIAGNOSIS — R262 Difficulty in walking, not elsewhere classified: Secondary | ICD-10-CM

## 2019-10-03 DIAGNOSIS — R0602 Shortness of breath: Secondary | ICD-10-CM

## 2019-10-03 DIAGNOSIS — R5383 Other fatigue: Secondary | ICD-10-CM | POA: Diagnosis not present

## 2019-10-03 DIAGNOSIS — E7849 Other hyperlipidemia: Secondary | ICD-10-CM

## 2019-10-03 DIAGNOSIS — Z0289 Encounter for other administrative examinations: Secondary | ICD-10-CM

## 2019-10-03 DIAGNOSIS — E739 Lactose intolerance, unspecified: Secondary | ICD-10-CM

## 2019-10-03 DIAGNOSIS — R7303 Prediabetes: Secondary | ICD-10-CM

## 2019-10-03 DIAGNOSIS — G4733 Obstructive sleep apnea (adult) (pediatric): Secondary | ICD-10-CM | POA: Diagnosis not present

## 2019-10-03 DIAGNOSIS — Z6841 Body Mass Index (BMI) 40.0 and over, adult: Secondary | ICD-10-CM

## 2019-10-03 DIAGNOSIS — Z1331 Encounter for screening for depression: Secondary | ICD-10-CM | POA: Diagnosis not present

## 2019-10-03 DIAGNOSIS — F3181 Bipolar II disorder: Secondary | ICD-10-CM | POA: Diagnosis not present

## 2019-10-03 NOTE — Progress Notes (Signed)
Dear Dr. Corliss BlackerMcNeill,   Thank you for referring Lauren Huffman to our clinic. The following note includes my evaluation and treatment recommendations.  Chief Complaint:   OBESITY Lauren Huffman (MR# [id number]) is a 64 y.o. female who presents for evaluation and treatment of obesity and related comorbidities. Current BMI is Body mass index is 40.75 kg/m. Lauren Huffman has been struggling with her weight for many years and has been unsuccessful in either losing weight, maintaining weight loss, or reaching her healthy weight goal.  Lauren Huffman is currently in the action stage of change and ready to dedicate time achieving and maintaining a healthier weight. Lauren Huffman is interested in becoming our patient and working on intensive lifestyle modifications including (but not limited to) diet and exercise for weight loss.  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  She is married and lives with her husband and daughter 62(29).  She says that her weight increased during COVID but after depression had improved, around 80 pounds.  Lauren Huffman habits were reviewed today and are as follows: she thinks her family will eat healthier with her, her desired weight loss is 77 pounds, she has been heavy most of her life, she started gaining weight in childhood, her heaviest weight ever was 277 pounds, she craves chocolate and sweets after meals, she snacks frequently in the evenings, she is frequently drinking liquids with calories, she frequently makes poor food choices and she struggles with emotional eating.  Depression Screen Lauren Huffman's Food and Mood (modified PHQ-9) score was 16.  [facility]  Decreased Interest 3  Down, Depressed, Hopeless 2  PHQ - 2 Score 5  Altered sleeping 0  Tired, decreased energy 2  Change in appetite 1  Feeling bad or failure about yourself  3  Trouble concentrating 2  Moving slowly or fidgety/restless 3  Suicidal thoughts 0  PHQ-9 Score 16  Difficult doing work/chores Very difficult   Subjective:   1.  Other fatigue Lauren Huffman admits to daytime somnolence and denies waking up still tired. Patent has a history of symptoms of daytime fatigue and snoring. Lauren Huffman generally gets 10-12 hours of sleep per night, and states that she has generally restful sleep. Snoring is present. Apneic episodes are not present. Epworth Sleepiness Score is 13.  Lauren Huffman has known OSA.  2. SOB (shortness of breath) on exertion Lauren Huffman notes increasing shortness of breath with exercising and seems to be worsening over time with weight gain. She notes getting out of breath sooner with activity than she used to. This has gotten worse recently. Lauren Huffman denies shortness of breath at rest or orthopnea.  3. OSA (obstructive sleep apnea) Lauren Huffman has a diagnosis of sleep apnea. She reports that she is not using a CPAP regularly.  She tried treatment and says she hated it.  Epworth sleepiness score is 13.  Situation Chance of Dozing or Sleeping  Sitting and reading 2 = moderate chance of dozing or sleeping  Watching television 3 = high chance of dozing or sleeping  Sitting inactive in a public place (theater or meeting) 0 = would never doze or sleep  Sitting as a passenger in a car for an hour 2 = moderate chance of dozing or sleeping  Lying down in the afternoon when circumstances permit 3 = high chance of dozing or sleeping  Sitting and talking to someone 1 = slight chance of dozing or sleeping  Sitting quietly after lunch without alcohol 2 = moderate chance of dozing or sleeping  In  a car, while stopped for a few minutes in traffic 0 = would never doze or sleep  TOTAL 13   4. Other hyperlipidemia Lauren Huffman has hyperlipidemia and has been trying to improve her cholesterol levels with intensive lifestyle modification including a low saturated fat diet, exercise and weight loss. She denies any chest pain, claudication or myalgias.  Lab Results  Component Value Date   ALT 21 03/24/2018   AST 21 03/24/2018   ALKPHOS 105 03/24/2018   BILITOT 0.6 03/24/2018    5. Prediabetes Lauren Huffman has a diagnosis of prediabetes based on her elevated HgA1c and was informed this puts her at greater risk of developing diabetes. She continues to work on diet and exercise to decrease her risk of diabetes. She denies nausea or hypoglycemia.  6. Vitamin D deficiency She is currently taking OTC vitamin D 2000 IU each day. She denies nausea, vomiting or muscle weakness.  7. Lactose intolerance Lauren Huffman is lactose intolerant.  8. Trouble walking Lauren Huffman says she has difficulty with her balance.  She denies pain.  9. Bipolar 2 disorder Lauren Huffman) Lauren Huffman sees Donnie Aho, NP for psychiatry.  She is taking divalproex ER, folic acid, Wellbutrin, citalopram, risperidone, and mirtazapine.  PH!-9 is 16.  10. Depression screening Lauren Huffman was screened for depression as a regular part of her new patient workup.  Assessment/Plan:   1. Other fatigue Lauren Huffman does feel that her weight is causing her energy to be lower than it should be. Fatigue may be related to obesity, depression or many other causes. Labs will be ordered, and in the meanwhile, Lauren Huffman will focus on self care including making healthy food choices, increasing physical activity and focusing on stress reduction.  Orders - EKG 12-Lead - Anemia panel - CBC with Differential/Platelet - T3 - T4, free - TSH  2. SOB (shortness of breath) on exertion Lauren Huffman does feel that she gets out of breath more easily that she used to when she exercises. Lauren Huffman shortness of breath appears to be obesity related and exercise induced. She has agreed to work on weight loss and gradually increase exercise to treat her exercise induced shortness of breath. Will continue to monitor closely.  She will see Cardiology tomorrow.  She has a history of SVT.  Orders - Anemia panel  3. OSA (obstructive sleep apnea) Intensive lifestyle modifications are the first line treatment for this issue. We discussed several lifestyle modifications today and she will continue to  work on diet, exercise and weight loss efforts. We will continue to monitor. Orders and follow up as documented in patient record.   Counseling  Sleep apnea is a condition in which breathing pauses or becomes shallow during sleep. This happens over and over during the night. This disrupts your sleep and keeps your body from getting the rest that it needs, which can cause tiredness and lack of energy (fatigue) during the day.  Sleep apnea treatment: If you were given a device to open your airway while you sleep, USE IT!  Sleep hygiene:   Limit or avoid alcohol, caffeinated beverages, and cigarettes, especially close to bedtime.   Do not eat a large meal or eat spicy foods right before bedtime. This can lead to digestive discomfort that can make it hard for you to sleep.  Keep a sleep diary to help you and your health care provider figure out what could be causing your insomnia.  . Make your bedroom a dark, comfortable place where it is easy to fall asleep. ? Put up  shades or blackout curtains to block light from outside. ? Use a white noise machine to block noise. ? Keep the temperature cool. . Limit screen use before bedtime. This includes: ? Watching TV. ? Using your smartphone, tablet, or computer. . Stick to a routine that includes going to bed and waking up at the same times every day and night. This can help you fall asleep faster. Consider making a quiet activity, such as reading, part of your nighttime routine. . Try to avoid taking naps during the day so that you sleep better at night. . Get out of bed if you are still awake after 15 minutes of trying to sleep. Keep the lights down, but try reading or doing a quiet activity. When you feel sleepy, go back to bed.  4. Other hyperlipidemia Cardiovascular risk and specific lipid/LDL goals reviewed.  We discussed several lifestyle modifications today and Lauren Huffman will continue to work on diet, exercise and weight loss efforts. Orders and  follow up as documented in patient record.   Counseling Intensive lifestyle modifications are the first line treatment for this issue. . Dietary changes: Increase soluble fiber. Decrease simple carbohydrates. . Exercise changes: Moderate to vigorous-intensity aerobic activity 150 minutes per week if tolerated. . Lipid-lowering medications: see documented in medical record. - Lipid Panel With LDL/HDL Ratio  5. Prediabetes Lauren Huffman will continue to work on weight loss, exercise, and decreasing simple carbohydrates to help decrease the risk of diabetes.   Orders - Comprehensive metabolic panel - Hemoglobin A1c - Insulin, random  6. Vitamin D deficiency Low Vitamin D level contributes to fatigue and are associated with obesity, breast, and colon cancer. She agrees to continue to take OTC Vitamin D @2 ,000 IU daily and will follow-up for routine testing of Vitamin D, at least 2-3 times per year to avoid over-replacement.  Orders - VITAMIN D 25 Hydroxy (Vit-D Deficiency, Fractures)  7. Lactose intolerance Current treatment plan is effective, no change in therapy.  8. Trouble walking We will continue to monitor.  9. Bipolar 2 disorder (HCC) Followed by Psychiatry for this problem. Those encounter notes were reviewed.  10. Depression screening Lauren Huffman had a positive depression screening. Depression is commonly associated with obesity and often results in emotional eating behaviors. We will monitor this closely and work on CBT to help improve the non-hunger eating patterns. Referral to Psychology may be required if no improvement is seen as she continues in our clinic.  11. Class 3 severe obesity with serious comorbidity and body mass index (BMI) of 40.0 to 44.9 in adult, unspecified obesity type [facility]) Lauren Huffman is currently in the action stage of change and her goal is to continue with weight loss efforts. I recommend Lauren Huffman begin the structured treatment plan as follows:  She has agreed to the Category 2  Plan.  Exercise goals: No exercise has been prescribed at this time.   Behavioral modification strategies: increasing lean protein intake, decreasing simple carbohydrates, increasing vegetables, increasing water intake, decreasing liquid calories, decreasing alcohol intake, decreasing sodium intake and increasing high fiber foods.  She was informed of the importance of frequent follow-up visits to maximize her success with intensive lifestyle modifications for her multiple health conditions. She was informed we would discuss her lab results at her next visit unless there is a critical issue that needs to be addressed sooner. Lauren Huffman agreed to keep her next visit at the agreed upon time to discuss these results.  Objective:   Blood pressure 118/83, pulse 91, temperature 98.3  F (36.8 C), temperature source Oral, height 5\' 8"  (1.727 m), weight 268 lb (121.6 kg), SpO2 95 %. Body mass index is 40.75 kg/m.  EKG: Normal sinus rhythm, rate 100 bpm.  Indirect Calorimeter completed today shows a VO2 of 209 and a REE of 1451.  Her calculated basal metabolic rate is thus her basal metabolic rate is worse than expected.  General: Cooperative, alert, well developed, in no acute distress. HEENT: Conjunctivae and lids unremarkable. Cardiovascular: Regular rhythm.  Lungs: Normal work of breathing. Neurologic: No focal deficits.   Lab Results  Component Value Date   CREATININE 0.83 03/24/2018   BUN 22 03/24/2018   NA 140 03/24/2018   K 3.7 03/24/2018   CL 105 03/24/2018   CO2 24 03/24/2018   Lab Results  Component Value Date   ALT 21 03/24/2018   AST 21 03/24/2018   ALKPHOS 105 03/24/2018   BILITOT 0.6 03/24/2018   Lab Results  Component Value Date   TSH 2.792 01/19/2017   Lab Results  Component Value Date   WBC 8.8 03/24/2018   HGB 11.8 (L) 03/24/2018   HCT 36.0 03/24/2018   MCV 94.2 03/24/2018   PLT 375 03/24/2018   Attestation Statements:   This is the patient's first visit  at [facility]. The patient's NEW PATIENT PACKET was reviewed at length. Included in the packet: current and past health history, medications, allergies, ROS, gynecologic history (women only), surgical history, family history, social history, weight history, weight loss surgery history (for those that have had weight loss surgery), nutritional evaluation, mood and food questionnaire, PHQ9, Epworth questionnaire, sleep habits questionnaire, patient life and health improvement goals questionnaire. These will all be scanned into the patient's chart under media.   During the visit, I independently reviewed the patient's EKG, bioimpedance scale results, and indirect calorimeter results. I used this information to tailor a meal plan for the patient that will help her to lose weight and will improve her obesity-related conditions going forward. I performed a medically necessary appropriate examination and/or evaluation. I discussed the assessment and treatment plan with the patient. The patient was provided an opportunity to ask questions and all were answered. The patient agreed with the plan and demonstrated an understanding of the instructions. Labs were ordered at this visit and will be reviewed at the next visit unless more critical results need to be addressed immediately. Clinical information was updated and documented in the EMR.   Time spent on visit including pre-visit chart review and post-visit charting and care was 65 minutes.   I, 03/26/2018, CMA, am acting as transcriptionist for Insurance claims handler, DO  I have reviewed the above documentation for accuracy and completeness, and I agree with the above. Helane Rima, DO

## 2019-10-04 ENCOUNTER — Ambulatory Visit: Payer: Medicare Other | Admitting: Physician Assistant

## 2019-10-04 LAB — COMPREHENSIVE METABOLIC PANEL
ALT: 15 IU/L (ref 0–32)
AST: 16 IU/L (ref 0–40)
Albumin/Globulin Ratio: 1.7 (ref 1.2–2.2)
Albumin: 4.2 g/dL (ref 3.8–4.8)
Alkaline Phosphatase: 115 IU/L (ref 48–121)
BUN/Creatinine Ratio: 23 (ref 12–28)
BUN: 18 mg/dL (ref 8–27)
Bilirubin Total: 0.4 mg/dL (ref 0.0–1.2)
CO2: 24 mmol/L (ref 20–29)
Calcium: 9.4 mg/dL (ref 8.7–10.3)
Chloride: 104 mmol/L (ref 96–106)
Creatinine, Ser: 0.77 mg/dL (ref 0.57–1.00)
GFR calc Af Amer: 93 mL/min/{1.73_m2} (ref >59)
GFR calc non Af Amer: 81 mL/min/{1.73_m2} (ref >59)
Globulin, Total: 2.5 g/dL (ref 1.5–4.5)
Glucose: 87 mg/dL (ref 65–99)
Potassium: 4.5 mmol/L (ref 3.5–5.2)
Sodium: 142 mmol/L (ref 134–144)
Total Protein: 6.7 g/dL (ref 6.0–8.5)

## 2019-10-04 LAB — CBC WITH DIFFERENTIAL/PLATELET
Basophils Absolute: 0.1 10*3/uL (ref 0.0–0.2)
Basos: 1 %
EOS (ABSOLUTE): 0.2 10*3/uL (ref 0.0–0.4)
Eos: 2 %
Hemoglobin: 15.9 g/dL (ref 11.1–15.9)
Immature Grans (Abs): 0.1 10*3/uL (ref 0.0–0.1)
Immature Granulocytes: 1 %
Lymphocytes Absolute: 1.8 10*3/uL (ref 0.7–3.1)
Lymphs: 21 %
MCH: 31.1 pg (ref 26.6–33.0)
MCHC: 34.8 g/dL (ref 31.5–35.7)
MCV: 89 fL (ref 79–97)
Monocytes Absolute: 0.6 10*3/uL (ref 0.1–0.9)
Monocytes: 7 %
Neutrophils Absolute: 6 10*3/uL (ref 1.4–7.0)
Neutrophils: 68 %
Platelets: 248 10*3/uL (ref 150–450)
RBC: 5.11 x10E6/uL (ref 3.77–5.28)
RDW: 13.4 % (ref 11.7–15.4)
WBC: 8.7 10*3/uL (ref 3.4–10.8)

## 2019-10-04 LAB — LIPID PANEL WITH LDL/HDL RATIO
Cholesterol, Total: 276 mg/dL — ABNORMAL HIGH (ref 100–199)
HDL: 52 mg/dL (ref >39)
LDL Chol Calc (NIH): 194 mg/dL — ABNORMAL HIGH (ref 0–99)
LDL/HDL Ratio: 3.7 ratio — ABNORMAL HIGH (ref 0.0–3.2)
Triglycerides: 163 mg/dL — ABNORMAL HIGH (ref 0–149)
VLDL Cholesterol Cal: 30 mg/dL (ref 5–40)

## 2019-10-04 LAB — ANEMIA PANEL
Ferritin: 85 ng/mL (ref 15–150)
Folate, Hemolysate: >620 ng/mL
Folate, RBC: >1357 ng/mL (ref >498)
Hematocrit: 45.7 % (ref 34.0–46.6)
Iron Saturation: 26 % (ref 15–55)
Iron: 81 ug/dL (ref 27–139)
Retic Ct Pct: 2.3 % (ref 0.6–2.6)
Total Iron Binding Capacity: 316 ug/dL (ref 250–450)
UIBC: 235 ug/dL (ref 118–369)
Vitamin B-12: 1858 pg/mL — ABNORMAL HIGH (ref 232–1245)

## 2019-10-04 LAB — HEMOGLOBIN A1C
Est. average glucose Bld gHb Est-mCnc: 108 mg/dL
Hgb A1c MFr Bld: 5.4 % (ref 4.8–5.6)

## 2019-10-04 LAB — VITAMIN D 25 HYDROXY (VIT D DEFICIENCY, FRACTURES): Vit D, 25-Hydroxy: 35.5 ng/mL (ref 30.0–100.0)

## 2019-10-04 LAB — T4, FREE: Free T4: 1.03 ng/dL (ref 0.82–1.77)

## 2019-10-04 LAB — INSULIN, RANDOM: INSULIN: 11.5 u[IU]/mL (ref 2.6–24.9)

## 2019-10-04 LAB — TSH: TSH: 2.82 u[IU]/mL (ref 0.450–4.500)

## 2019-10-04 LAB — T3: T3, Total: 163 ng/dL (ref 71–180)

## 2019-10-17 ENCOUNTER — Ambulatory Visit (INDEPENDENT_AMBULATORY_CARE_PROVIDER_SITE_OTHER): Payer: BC Managed Care – PPO | Admitting: Family Medicine

## 2019-10-19 ENCOUNTER — Ambulatory Visit (INDEPENDENT_AMBULATORY_CARE_PROVIDER_SITE_OTHER): Payer: Medicare Other | Admitting: Family Medicine

## 2019-10-22 ENCOUNTER — Telehealth (INDEPENDENT_AMBULATORY_CARE_PROVIDER_SITE_OTHER): Payer: Self-pay | Admitting: Family Medicine

## 2019-10-23 ENCOUNTER — Ambulatory Visit (INDEPENDENT_AMBULATORY_CARE_PROVIDER_SITE_OTHER): Payer: Medicare Other | Admitting: Family Medicine

## 2019-10-23 ENCOUNTER — Other Ambulatory Visit: Payer: Self-pay

## 2019-10-23 ENCOUNTER — Encounter (INDEPENDENT_AMBULATORY_CARE_PROVIDER_SITE_OTHER): Payer: Self-pay | Admitting: Family Medicine

## 2019-10-23 ENCOUNTER — Ambulatory Visit: Payer: Medicare Other | Admitting: Cardiology

## 2019-10-23 VITALS — BP 112/82 | HR 82 | Temp 98.1°F | Ht 68.0 in | Wt 270.0 lb

## 2019-10-23 DIAGNOSIS — E7849 Other hyperlipidemia: Secondary | ICD-10-CM | POA: Diagnosis not present

## 2019-10-23 DIAGNOSIS — R7303 Prediabetes: Secondary | ICD-10-CM | POA: Diagnosis not present

## 2019-10-23 DIAGNOSIS — Z6841 Body Mass Index (BMI) 40.0 and over, adult: Secondary | ICD-10-CM

## 2019-10-23 DIAGNOSIS — E538 Deficiency of other specified B group vitamins: Secondary | ICD-10-CM | POA: Diagnosis not present

## 2019-10-23 DIAGNOSIS — E559 Vitamin D deficiency, unspecified: Secondary | ICD-10-CM

## 2019-10-23 DIAGNOSIS — F319 Bipolar disorder, unspecified: Secondary | ICD-10-CM

## 2019-10-23 NOTE — Telephone Encounter (Signed)
Lm for pt to return call in regards to moving appt up. Jeralene Peters, LPN

## 2019-10-24 ENCOUNTER — Encounter (INDEPENDENT_AMBULATORY_CARE_PROVIDER_SITE_OTHER): Payer: Self-pay

## 2019-10-24 ENCOUNTER — Other Ambulatory Visit (INDEPENDENT_AMBULATORY_CARE_PROVIDER_SITE_OTHER): Payer: Self-pay | Admitting: Family Medicine

## 2019-10-24 DIAGNOSIS — E559 Vitamin D deficiency, unspecified: Secondary | ICD-10-CM

## 2019-10-24 MED ORDER — VITAMIN D (ERGOCALCIFEROL) 1.25 MG (50000 UNIT) PO CAPS: 50000.0000 [IU] | ORAL_CAPSULE | ORAL | 0 refills | Status: DC

## 2019-10-24 NOTE — Progress Notes (Addendum)
Chief Complaint:   OBESITY Lauren Huffman is here to discuss her progress with her obesity treatment plan along with follow-up of her obesity related diagnoses. Lauren Huffman is on the Category 2 Plan and states she is following her eating plan approximately 0% of the time. Lauren Huffman states she is exercising for 0 minutes 0 times per week.  Today's visit was #: 2 Starting weight: 268 lbs Starting date: 10/03/2019 Today's weight: 270 lbs Today's date: 10/23/2019 Total lbs lost to date: 0 Total lbs lost since last in-office visit: 0  Interim History: This is Lauren Huffman's first follow-up after being seen by Dr. Earlene Plater on 10/03/2019.  Dr. Corliss Blacker is her PCP.  She was last seen 6 weeks ago and was referred to Cardiology.  She did not do the plan at all.  She says that her husband was recently diagnosis with diabetes and her daughter recently came in to start as a new patient today as well.  They just did not know where to start.  Subjective:   1. Prediabetes Lauren Huffman has a diagnosis of prediabetes based on her elevated HgA1c and was informed this puts her at greater risk of developing diabetes. She continues to work on diet and exercise to decrease her risk of diabetes. She denies nausea or hypoglycemia.  Lauren Huffman says she was told she was prediabetic about 3 years ago.  She has an elevated fasting insulin level.  Lab Results  Component Value Date   HGBA1C 5.4 10/03/2019   Lab Results  Component Value Date   INSULIN 11.5 10/03/2019   2. Vitamin D deficiency Lauren Huffman's Vitamin D level was 35.5 on 10/03/2019. She is currently taking OTC vitamin D 1000 IU each day. She denies nausea, vomiting or muscle weakness.  She has had vitamin D deficiency several times in the past.  She says she usually needs a "bolus" every 6 months to get up again.  She endorses fatigue and tiredness.  3. Other hyperlipidemia Lauren Huffman has hyperlipidemia and has been trying to improve her cholesterol levels with intensive lifestyle modification including a low  saturated fat diet, exercise and weight loss. She denies any chest pain, claudication or myalgias.  She was last seen by Cardiology on 11/14/2019.  She has an appointment coming up with Cardiology in early August.  She is allergic to statins.  Lab Results  Component Value Date   ALT 15 10/03/2019   AST 16 10/03/2019   ALKPHOS 115 10/03/2019   BILITOT 0.4 10/03/2019   Lab Results  Component Value Date   CHOL 276 (H) 10/03/2019   HDL 52 10/03/2019   LDLCALC 194 (H) 10/03/2019   TRIG 163 (H) 10/03/2019   4. B12 deficiency She is not a vegetarian.  She does not have a previous diagnosis of pernicious anemia.  She does not have a history of weight loss surgery.  She has been on supplementation for around 3 years.  Lab Results  Component Value Date   VITAMINB12 1,858 (H) 10/03/2019   5. Bipolar affective disorder, remission status unspecified (HCC) She sees a psychiatric provider for treatment.  She says she has found a Veterinary surgeon, but her insurance is giving her trouble.  Assessment/Plan:   1. Prediabetes Discussed labs with patient today.  Lauren Huffman will continue to work on weight loss, exercise, and decreasing simple carbohydrates to help decrease the risk of diabetes.  Handout on prediabetes and insulin resistance given to patient in addition to extensive education provided today.  2. Vitamin D deficiency Discussed labs with  patient today. Low Vitamin D level contributes to fatigue and are associated with obesity, breast, and colon cancer. She agrees to continue to take prescription Vitamin D @50 ,000 IU every week and will follow-up for routine testing of Vitamin D, at least 2-3 times per year to avoid over-replacement.  She will discontinue OTC vitamin D. - Vitamin D, Ergocalciferol, (DRISDOL) 1.25 MG (50000 UNIT) CAPS capsule; Take 1 capsule (50,000 Units total) by mouth every 7 (seven) days.  Dispense: 4 capsule; Refill: 0  3. Other hyperlipidemia Discussed labs with patient today.   Cardiovascular risk and specific lipid/LDL goals reviewed.  We discussed several lifestyle modifications today and [name] will continue to work on diet, exercise and weight loss efforts. Orders and follow up as documented in patient record.  Follow-up with PCP and Cardiology for treatment.    Counseling Intensive lifestyle modifications are the first line treatment for this issue. . Dietary changes: Increase soluble fiber. Decrease simple carbohydrates. . Exercise changes: Moderate to vigorous-intensity aerobic activity 150 minutes per week if tolerated. . Lipid-lowering medications: see documented in medical record.  4. B12 deficiency Discussed labs with patient today.  The diagnosis was reviewed with the patient. Counseling provided today, see below. We will continue to monitor. Orders and follow up as documented in patient record.  She will cut her OTC dose in half.  Will continue to monitor.  Counseling . The body needs vitamin B12: to make red blood cells; to make DNA; and to help the nerves work properly so they can carry messages from the brain to the body.  . The main causes of vitamin B12 deficiency include dietary deficiency, digestive diseases, pernicious anemia, and having a surgery in which part of the stomach or small intestine is removed.  . Certain medicines can make it harder for the body to absorb vitamin B12. These medicines include: heartburn medications; some antibiotics; some medications used to treat diabetes, gout, and high cholesterol.  . In some cases, there are no symptoms of this condition. If the condition leads to anemia or nerve damage, various symptoms can occur, such as weakness or fatigue, shortness of breath, and numbness or tingling in your hands and feet.   . Treatment:  o May include taking vitamin B12 supplements.  o Avoid alcohol.  o Eat lots of healthy foods that contain vitamin B12: - Beef, pork, chicken, Feb, and organ meats, such as liver.  - Seafood:  This includes clams, rainbow trout, salmon, tuna, and haddock. Eggs.  - Cereal and dairy products that are fortified: This means that vitamin B12 has been added to the food.   5. Bipolar affective disorder, remission status unspecified (HCC) Discussed labs with patient today.  She will ask her psychiatrist for referral to counselor.  She is not a candidate for our psychologist at the clinic due to medical history.   6. Class 3 severe obesity with serious comorbidity and body mass index (BMI) of 40.0 to 44.9 in adult, unspecified obesity type [facility]) Lauren Huffman is currently in the action stage of change. As such, her goal is to continue with weight loss efforts. She has agreed to the Category 2 Plan.   Exercise goals: All adults should avoid inactivity. Some physical activity is better than none, and adults who participate in any amount of physical activity gain some health benefits.  Behavioral modification strategies: increasing lean protein intake, decreasing simple carbohydrates, decreasing eating out, meal planning and cooking strategies and planning for success.  Lauren Huffman has agreed to follow-up  with our clinic in 2 weeks. She was informed of the importance of frequent follow-up visits to maximize her success with intensive lifestyle modifications for her multiple health conditions.   Objective:   Blood pressure 112/82, pulse 82, temperature 98.1 F (36.7 C), temperature source Oral, height 5\' 8"  (1.727 m), weight 270 lb (122.5 kg), SpO2 96 %. Body mass index is 41.05 kg/m.  General: Cooperative, alert, well developed, in no acute distress. HEENT: Conjunctivae and lids unremarkable. Cardiovascular: Regular rhythm.  Lungs: Normal work of breathing. Neurologic: No focal deficits.   Lab Results  Component Value Date   CREATININE 0.77 10/03/2019   BUN 18 10/03/2019   NA 142 10/03/2019   K 4.5 10/03/2019   CL 104 10/03/2019   CO2 24 10/03/2019   Lab Results  Component Value Date   ALT 15  10/03/2019   AST 16 10/03/2019   ALKPHOS 115 10/03/2019   BILITOT 0.4 10/03/2019   Lab Results  Component Value Date   HGBA1C 5.4 10/03/2019   Lab Results  Component Value Date   INSULIN 11.5 10/03/2019   Lab Results  Component Value Date   TSH 2.820 10/03/2019   Lab Results  Component Value Date   CHOL 276 (H) 10/03/2019   HDL 52 10/03/2019   LDLCALC 194 (H) 10/03/2019   TRIG 163 (H) 10/03/2019   Lab Results  Component Value Date   WBC 8.7 10/03/2019   HGB 15.9 10/03/2019   HCT 45.7 10/03/2019   MCV 89 10/03/2019   PLT 248 10/03/2019   Lab Results  Component Value Date   IRON 81 10/03/2019   TIBC 316 10/03/2019   FERRITIN 85 10/03/2019   Attestation Statements:   Reviewed by clinician on day of visit: allergies, medications, problem list, medical history, surgical history, family history, social history, and previous encounter notes.  Time spent on visit including pre-visit chart review and post-visit care and charting was 60 minutes.   I, 10/05/2019, CMA, am acting as Insurance claims handler for Energy manager, DO.  I have reviewed the above documentation for accuracy and completeness, and I agree with the above. [employer], DO

## 2019-10-24 NOTE — Telephone Encounter (Signed)
Please see

## 2019-11-09 NOTE — Progress Notes (Signed)
Cardiology Office Note    Date:  11/14/2019   ID:  Lauren Huffman, DOB Dec 31, 1955, MRN [id number]  PCP:  Lauren DimitriMcNeill, Wendy, MD  Cardiologist: Lauren Magicraci Turner, MD EPS: None  Chief Complaint  Patient presents with   Shortness of Breath    History of Present Illness:  Lauren Huffman is a 64 y.o. female  with a  history of SVT,carpal tunnel, HTN, HLD, depression, anxiety, sleep apnea (unable to tolerate CPAP) & varicose veins s/p ablation. FH + for CAD.   She was in the ER October 2018 with symptomatic palpitations. Found to be in SVT - treated with Adenosine and converted - then started on CCB which she hasn't tolerated. She reported prior side effects with Metoprolol. She has not been able to tolerate CPAP. Echo was basically normal. She declined event monitor due to cost.    Last seen in our office 02/2018 for preop clearance.  Patient comes in because she had 2-3 days of dyspnea on exertion in June. She cancelled 3 appts because of anxiety. At the time that it happened she was shopping for an event that she had anxiety over. She is sedentary and this was more activity than she was used to. No fast heart rates or palpitations, chest pain, leg edema. Walks with a cane after knee and hip replacements. Says she's lazy and doesn't exercise. F had a silent MI, sister being treated for heart now but doesn't know what. LDL 194   Past Medical History:  Diagnosis Date   Aneurysm, lower extremity (HCC)    Anxiety    Arthritis    Bipolar 2 disorder (HCC)    Carpal tunnel syndrome    Right   Chronic pain    Depression    GERD (gastroesophageal reflux disease)    Headache(784.0)    Hiatal hernia    History of colon polyps    History of iron deficiency    Hyperlipemia    Hypertension    Joint pain    Lactose intolerance    Left arm pain    Lower extremity edema    Memory difficulty    Mental disorder    Obesity    OSA (obstructive sleep apnea)    Osteoarthritis     Prediabetes    Sleep apnea    DOES NOT WEAR CPAP     Sleep disturbance    SOB (shortness of breath)    Status post ablation of incompetent vein using laser 2x in Nov. 2017   right   SVT (supraventricular tachycardia) (HCC)    TMJ (dislocation of temporomandibular joint)    Varicose veins    Vitamin D insufficiency     Past Surgical History:  Procedure Laterality Date   CYST REMOVAL HAND Left    JOINT REPLACEMENT Right    knee   KNEE ARTHROPLASTY  12/25/2011   Procedure: COMPUTER ASSISTED TOTAL KNEE ARTHROPLASTY;  Surgeon: Harvie JuniorJohn L Graves, MD;  Location: [facility] OR;  Service: Orthopedics;  Laterality: Right;  Right total knee replacement, general anesthesia, femoral nerve block   KNEE ARTHROPLASTY Left 07/08/2012   Procedure: COMPUTER ASSISTED TOTAL KNEE ARTHROPLASTY;  Surgeon: Harvie JuniorJohn L Graves, MD;  Location: [facility] OR;  Service: Orthopedics;  Laterality: Left;  PRE OP FEMORAL NERVE BLOCK   lazer vein surgery  2017   right leg   TOTAL HIP ARTHROPLASTY Right 03/27/2016   TOTAL HIP ARTHROPLASTY Right 03/27/2016   Procedure: TOTAL HIP ARTHROPLASTY ANTERIOR APPROACH;  Surgeon: Jodi GeraldsJohn Graves, MD;  Location: [facility] OR;  Service: Orthopedics;  Laterality: Right;   TOTAL KNEE ARTHROPLASTY Left 07/08/2012   [name]    Current Medications: Current Meds  Medication Sig   buPROPion (WELLBUTRIN XL) 150 MG 24 hr tablet Take 150 mg by mouth daily.   Calcium-Phosphorus-Vitamin D (CALCIUM GUMMIES PO) Take 500 mg by mouth.   Cholecalciferol (VITAMIN D3) 25 MCG (1000 UT) CAPS Take 2,000 Units by mouth daily.    colestipol (COLESTID) 1 g tablet Take 2 g by mouth in the morning.   Cyanocobalamin (VITAMIN B-12) 3000 MCG SUBL Place 3,000 mcg under the tongue daily.    divalproex (DEPAKOTE ER) 500 MG 24 hr tablet 1 AM, 1/2 PM   ELDERBERRY PO Take by mouth daily.   ferrous sulfate 325 (65 FE) MG tablet Take 325 mg by mouth daily with breakfast.   folic acid (FOLVITE) 1 MG tablet Take 2 mg by mouth  daily.    loratadine (CLARITIN) 10 MG tablet Take 10 mg by mouth daily.   mirtazapine (REMERON) 7.5 MG tablet Take 1 tablet (7.5 mg total) by mouth at bedtime.   Multiple Vitamins-Minerals (MULTIVITAMIN GUMMIES ADULT PO) Take 1 tablet by mouth daily.    omeprazole (PRILOSEC) 40 MG capsule Take 40 mg by mouth 2 (two) times daily.    Probiotic Product (ALIGN) CHEW Chew 1 tablet by mouth daily.   risperiDONE (RISPERDAL) 0.5 MG tablet Take 0.5 mg by mouth at bedtime.   TURMERIC PO Take 1,075 mg by mouth daily.   vitamin C (ASCORBIC ACID) 500 MG tablet Take 500 mg by mouth daily.   Vitamin D, Ergocalciferol, (DRISDOL) 1.25 MG (50000 UNIT) CAPS capsule Take 1 capsule (50,000 Units total) by mouth every 7 (seven) days.     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   No daily caffeine use.   Right-handed.   Social Determinants of Health   Financial Resource Strain:    Difficulty of Paying Living Expenses:   Food Insecurity:    Worried About Programme researcher, broadcasting/film/video in the Last Year:    Barista in the Last Year:   Transportation Needs:    Freight forwarder (Medical):    Lack of Transportation (Non-Medical):   Physical Activity:    Days of Exercise per Week:    Minutes of Exercise per Session:   Stress:    Feeling of Stress :     Social Connections:    Frequency of Communication with Friends and Family:    Frequency of Social Gatherings with Friends and Family:    Attends Religious Services:    Active Member of Clubs or Organizations:    Attends Banker Meetings:    Marital Status:      Family History:  The patient's family history includes CAD in her mother; Cancer in her father; Diabetes in her father; Heart disease in her father and mother; Hyperlipidemia in her mother and sister; Memory loss in her mother; Seizures in her mother; Stroke in her mother; Supraventricular tachycardia in her sister; Thyroid disease in her mother.   ROS:   Please see the history of present illness.    ROS All other systems reviewed and are negative.   PHYSICAL EXAM:   VS:  BP 134/86    Pulse 94    Ht 5\' 8"  (1.727 m)    Wt 274 lb (124.3 kg)    SpO2 95%    BMI 41.66 kg/m   Physical Exam  GEN: Obese, in no acute distress  Neck: no JVD, carotid bruits, or masses Cardiac:RRR; no murmurs, rubs, or gallops  Respiratory:  clear to auscultation bilaterally, normal work of breathing GI: soft, nontender, nondistended, + BS Ext: without cyanosis, clubbing, or edema, Good distal pulses bilaterally Neuro:  Alert and Oriented x 3 Psych: euthymic mood, full affect  Wt Readings from Last 3 Encounters:  11/14/19 274 lb (124.3 kg)  11/13/19 271 lb (122.9 kg)  10/23/19 270 lb (122.5 kg)      Studies/Labs Reviewed:   EKG:  EKG is not ordered today.  The ekg reviewed from 10/03/19 NSR with Left post fascicular block Recent Labs: 10/03/2019: ALT 15; BUN 18; Creatinine, Ser 0.77; Hemoglobin 15.9; Platelets 248; Potassium 4.5; Sodium 142; TSH 2.820   Lipid Panel    Component Value Date/Time   CHOL 276 (H) 10/03/2019 1450   TRIG 163 (H) 10/03/2019 1450   HDL 52 10/03/2019 1450   LDLCALC 194 (H) 10/03/2019 1450    Additional studies/ records that were reviewed today include:  Echo Study Conclusions 01/2017   - Left  ventricle: The cavity size was normal. Wall thickness was   increased in a pattern of mild LVH. Systolic function was normal.   The estimated ejection fraction was in the range of 55% to 60%.   Wall motion was normal; there were no regional wall motion   abnormalities. Doppler parameters are consistent with abnormal   left ventricular relaxation (grade 1 diastolic dysfunction).   Doppler parameters are consistent with indeterminate ventricular   filling pressure. - Aortic valve: Transvalvular velocity was within the normal range.   There was no stenosis. There was no regurgitation. - Mitral valve: Transvalvular velocity was within the normal range.   There was no evidence for stenosis. There was mild regurgitation. - Left atrium: The atrium was mildly dilated. - Right ventricle: The cavity size was normal. Wall thickness was   normal. Systolic function was normal. - Atrial septum: No defect or patent foramen ovale was identified   by color flow Doppler. - Tricuspid valve: There was mild regurgitation. - Pulmonary arteries: Systolic pressure was at the upper limits of   normal. PA peak pressure: 39 mm Hg (S).         ASSESSMENT:    1. DOE (dyspnea on exertion)   2. PSVT (paroxysmal supraventricular tachycardia) (HCC)   3. Mixed hyperlipidemia   4. OSA (obstructive sleep apnea)   5. Family history of early CAD   6. Class 3 severe obesity due to excess calories without serious comorbidity with body mass index (BMI) of 40.0 to 44.9 in adult [facility])      PLAN:  In order of problems listed above:  DOE on 3 occasions while shopping for an event that  caused anxiety. No recurrence since. No associated chest pain or palpitations. Sedentary lifestyle. No CHF on exam, does have grade 1 DD and was eating out a lot. Will check echo and coronary CTA.  History of SVT-no recurrence  History of OSA could not tolerate CPAP  Hyperlipidemia LDL 194-going to healthy weight loss center. Will add  lipitor 20 mg once daily and f/u labs in 2 months  Family history of CAD  Obesity-going to weight loss center  Medication Adjustments/Labs and Tests Ordered: Current medicines are reviewed at length with the patient today.  Concerns regarding medicines are outlined above.  Medication changes, Labs and Tests ordered today are listed in the Patient Instructions below. There are no Patient Instructions on file for this visit.   Elson Clan, PA-C  11/14/2019 1:47 PM    [facility] [address] [place], [place], [place]  [address] Phone: [phone]; Fax: [phone]

## 2019-11-13 ENCOUNTER — Encounter (INDEPENDENT_AMBULATORY_CARE_PROVIDER_SITE_OTHER): Payer: Self-pay | Admitting: Family Medicine

## 2019-11-13 ENCOUNTER — Ambulatory Visit (INDEPENDENT_AMBULATORY_CARE_PROVIDER_SITE_OTHER): Payer: Medicare Other | Admitting: Family Medicine

## 2019-11-13 ENCOUNTER — Other Ambulatory Visit: Payer: Self-pay

## 2019-11-13 VITALS — BP 126/82 | HR 93 | Temp 97.9°F | Ht 68.0 in | Wt 271.0 lb

## 2019-11-13 DIAGNOSIS — Z6841 Body Mass Index (BMI) 40.0 and over, adult: Secondary | ICD-10-CM | POA: Diagnosis not present

## 2019-11-13 DIAGNOSIS — E559 Vitamin D deficiency, unspecified: Secondary | ICD-10-CM | POA: Diagnosis not present

## 2019-11-13 DIAGNOSIS — E782 Mixed hyperlipidemia: Secondary | ICD-10-CM

## 2019-11-13 DIAGNOSIS — E8881 Metabolic syndrome: Secondary | ICD-10-CM

## 2019-11-13 NOTE — Telephone Encounter (Signed)
FYI

## 2019-11-14 ENCOUNTER — Ambulatory Visit: Payer: Medicare Other | Admitting: Physician Assistant

## 2019-11-14 ENCOUNTER — Encounter: Payer: Self-pay | Admitting: Physician Assistant

## 2019-11-14 VITALS — BP 134/86 | HR 94 | Ht 68.0 in | Wt 274.0 lb

## 2019-11-14 DIAGNOSIS — G4733 Obstructive sleep apnea (adult) (pediatric): Secondary | ICD-10-CM | POA: Diagnosis not present

## 2019-11-14 DIAGNOSIS — E782 Mixed hyperlipidemia: Secondary | ICD-10-CM | POA: Diagnosis not present

## 2019-11-14 DIAGNOSIS — I471 Supraventricular tachycardia: Secondary | ICD-10-CM

## 2019-11-14 DIAGNOSIS — R0600 Dyspnea, unspecified: Secondary | ICD-10-CM

## 2019-11-14 DIAGNOSIS — Z6841 Body Mass Index (BMI) 40.0 and over, adult: Secondary | ICD-10-CM

## 2019-11-14 DIAGNOSIS — Z8249 Family history of ischemic heart disease and other diseases of the circulatory system: Secondary | ICD-10-CM

## 2019-11-14 DIAGNOSIS — R0609 Other forms of dyspnea: Secondary | ICD-10-CM

## 2019-11-14 MED ORDER — ATORVASTATIN CALCIUM 20 MG PO TABS
20.0000 mg | ORAL_TABLET | Freq: Every day | ORAL | 3 refills | Status: DC
Start: 2019-11-14 — End: 2019-12-06

## 2019-11-14 MED ORDER — METOPROLOL TARTRATE 100 MG PO TABS
100.0000 mg | ORAL_TABLET | Freq: Once | ORAL | 0 refills | Status: DC
Start: 2019-11-14 — End: 2019-12-19

## 2019-11-14 MED ORDER — VITAMIN D (ERGOCALCIFEROL) 1.25 MG (50000 UNIT) PO CAPS: 50000.0000 [IU] | ORAL_CAPSULE | ORAL | 0 refills | Status: DC

## 2019-11-14 NOTE — Patient Instructions (Addendum)
Medication Instructions:  Your physician has recommended you make the following change in your medication:   START: Atorvastatin 56m daily Take Metoprolol 1079m2 hours prior to CT Scan  *If you need a refill on your cardiac medications before your next appointment, please call your pharmacy*   Lab Work: BMET TODAY LFT, FLP in 3 mths Your physician recommends that you return for a FASTING lipid profile:   If you have labs (blood work) drawn today and your tests are completely normal, you will receive your results only by: . Marland KitchenyChart Message (if you have MyChart) OR . A paper copy in the mail If you have any lab test that is abnormal or we need to change your treatment, we will call you to review the results.   Testing/Procedures: Your physician has requested that you have an echocardiogram. Echocardiography is a painless test that uses sound waves to create images of your heart. It provides your doctor with information about the size and shape of your heart and how well your heart's chambers and valves are working. This procedure takes approximately one hour. There are no restrictions for this procedure.   Follow-Up: [facility] are our [facility].  As part of our continuing mission to provide you with exceptional heart care, we have created designated Provider Care Teams.  These Care Teams include your primary Cardiologist (physician) and Advanced Practice Providers (APPs -  Physician Assistants and Nurse Practitioners) who all work together to provide you with the care you need, when you need it.  We recommend signing up for the patient portal called "MyChart".  Sign up information is provided on this After Visit Summary.  MyChart is used to connect with patients for Virtual Visits (Telemedicine).  Patients are able to view lab/test results, encounter notes, upcoming appointments, etc.  Non-urgent messages can be sent to your provider as well.   To learn  more about what you can do with MyChart, go to [email or web address]   Your next appointment:   After testing  The format for your next appointment:   Either In-Person or Virtual  Provider:   MiErmalinda BarriosPA-C   Other Instructions Your cardiac CT will be scheduled at one of the below locations:   [facility] [id number] [phone] scheduled at [facility] the [facility] minutes prior to test start time. Pro[facility] to the [facility] (first floor) to check-in and test prep.  If scheduled at [facility] arrive 15 mins early for check-in and test prep.  Please follow these instructions carefully (unless otherwise directed):   On the Night Before the Test: . Be sure to Drink plenty of water. . Do not consume any caffeinated/decaffeinated beverages or chocolate 12 hours prior to your test. . Do not take any antihistamines 12 hours prior to your test.  On the Day of the Test: . Drink plenty of water. Do not drink any water within one hour of the test. . Do not eat any food 4 hours prior to the test. . You may take your regular medications prior to the test.  . Take metoprolol (Lopressor) two hours prior to test. . HOLD Furosemide/Hydrochlorothiazide morning of the test. . FEMALES- please wear underwire-free bra if  available   *For Clinical Staff only. Please instruct patient the following:*        -Drink plenty of water       -Hold Furosemide/hydrochlorothiazide morning of the test       -Take metoprolol (Lopressor) 2 hours prior to test (if applicable).                      After the Test: . Drink plenty of water. . After receiving IV contrast, you may experience a mild flushed  feeling. This is normal. . On occasion, you may experience a mild rash up to 24 hours after the test. This is not dangerous. If this occurs, you can take Benadryl 25 mg and increase your fluid intake. . If you experience trouble breathing, this can be serious. If it is severe call 911 IMMEDIATELY. If it is mild, please call our office. . If you take any of these medications: Glipizide/Metformin, Avandament, Glucavance, please do not take 48 hours after completing test unless otherwise instructed.   Once we have confirmed authorization from your insurance company, we will call you to set up a date and time for your test. Based on how quickly your insurance processes prior authorizations requests, please allow up to 4 weeks to be contacted for scheduling your Cardiac CT appointment. Be advised that routine Cardiac CT appointments could be scheduled as many as 8 weeks after your provider has ordered it.  For non-scheduling related questions, please contact the cardiac imaging nurse navigator should you have any questions/concerns: Marchia Bond, Cardiac Imaging Nurse Navigator Burley Saver, Interim Cardiac Imaging Nurse [place] [facility] Direct Office Dial: [phone]   For scheduling needs, including cancellations and rescheduling, please call Vivien Rota at [phone], option 3.

## 2019-11-14 NOTE — Progress Notes (Signed)
Chief Complaint:   OBESITY Lauren Huffman is here to discuss her progress with her obesity treatment plan along with follow-up of her obesity related diagnoses. Lauren Huffman is on the Category 2 Plan and states she is following her eating plan approximately 40% of the time. Lauren Huffman states she is exercising for 0 minutes 0 times per week.  Today's visit was #: 3 Starting weight: 268 lbs Starting date: 10/03/2019 Today's weight: 271 lbs Today's date: 11/13/2019 Total lbs lost to date: 0 Total lbs lost since last in-office visit: 0  Interim History: Lauren Huffman has missed 2 appointments due to anxiety.  She says she is interested in a Librarian, academic.    Subjective:   1. Insulin resistance  Lab Results  Component Value Date   INSULIN 11.5 10/03/2019   Lab Results  Component Value Date   HGBA1C 5.4 10/03/2019   2. Mixed hyperlipidemia She denies any chest pain, claudication or myalgias.  She has an appointment with Cardiology tomorrow.  Lab Results  Component Value Date   ALT 15 10/03/2019   AST 16 10/03/2019   ALKPHOS 115 10/03/2019   BILITOT 0.4 10/03/2019   Lab Results  Component Value Date   CHOL 276 (H) 10/03/2019   HDL 52 10/03/2019   LDLCALC 194 (H) 10/03/2019   TRIG 163 (H) 10/03/2019   3. Vitamin D deficiency Lauren Huffman's Vitamin D level was 35.5 on 10/03/2019. She is currently taking prescription vitamin D 50,000 IU each week.   Assessment/Plan:   1. Insulin resistance Not optimized. Goal is HgbA1c < 5.7 and insulin level closer to 5. Lauren Huffman will continue to work on weight loss, exercise, and decreasing simple carbohydrates to help decrease the risk of diabetes. Lauren Huffman agreed to follow-up with [place] as directed to closely monitor her progress.  2. Mixed hyperlipidemia Cardiovascular risk and specific lipid/LDL goals reviewed.  We discussed several lifestyle modifications today and Lauren Huffman will continue to work on diet, exercise and weight loss efforts. Orders and follow up as documented in patient  record.   3. Vitamin D deficiency Not at goal. Optimal goal > 50 ng/dL. There is also evidence to support a goal of >70 ng/dL in patients with cancer and heart disease. She agrees to continue to take prescription Vitamin D @50 ,000 IU every week and will follow-up for routine testing of Vitamin D, at least 2-3 times per year to avoid over-replacement.  Orders - Vitamin D, Ergocalciferol, (DRISDOL) 1.25 MG (50000 UNIT) CAPS capsule; Take 1 capsule (50,000 Units total) by mouth every 7 (seven) days.  Dispense: 4 capsule; Refill: 0  4. Class 3 severe obesity with serious comorbidity and body mass index (BMI) of 40.0 to 44.9 in adult, unspecified obesity type [facility]) Lauren Huffman is currently in the action stage of change. As such, her goal is to continue with weight loss efforts. She has agreed to the Category 2 Plan.   Exercise goals: All adults should avoid inactivity. Some physical activity is better than none, and adults who participate in any amount of physical activity gain some health benefits.  Behavioral modification strategies: increasing lean protein intake.  Reviewed food choices and options.  Lauren Huffman has agreed to follow-up with our clinic in 2-3 weeks. She was informed of the importance of frequent follow-up visits to maximize her success with intensive lifestyle modifications for her multiple health conditions.   Objective:   Blood pressure 126/82, pulse 93, temperature 97.9 F (36.6 C), temperature source Oral, height 5\' 8"  (1.727 m), weight 271 lb (122.9  kg), SpO2 94 %. Body mass index is 41.21 kg/m.  General: Cooperative, alert, well developed, in no acute distress. HEENT: Conjunctivae and lids unremarkable. Cardiovascular: Regular rhythm.  Lungs: Normal work of breathing. Neurologic: No focal deficits.   Lab Results  Component Value Date   CREATININE 0.77 10/03/2019   BUN 18 10/03/2019   NA 142 10/03/2019   K 4.5 10/03/2019   CL 104 10/03/2019   CO2 24 10/03/2019   Lab  Results  Component Value Date   ALT 15 10/03/2019   AST 16 10/03/2019   ALKPHOS 115 10/03/2019   BILITOT 0.4 10/03/2019   Lab Results  Component Value Date   HGBA1C 5.4 10/03/2019   Lab Results  Component Value Date   INSULIN 11.5 10/03/2019   Lab Results  Component Value Date   TSH 2.820 10/03/2019   Lab Results  Component Value Date   CHOL 276 (H) 10/03/2019   HDL 52 10/03/2019   LDLCALC 194 (H) 10/03/2019   TRIG 163 (H) 10/03/2019   Lab Results  Component Value Date   WBC 8.7 10/03/2019   HGB 15.9 10/03/2019   HCT 45.7 10/03/2019   MCV 89 10/03/2019   PLT 248 10/03/2019   Lab Results  Component Value Date   IRON 81 10/03/2019   TIBC 316 10/03/2019   FERRITIN 85 10/03/2019   Obesity Behavioral Intervention:   Approximately 15 minutes were spent on the discussion below.  ASK: We discussed the diagnosis of obesity with Lauren Huffman today and Lauren Huffman agreed to give [place] permission to discuss obesity behavioral modification therapy today.  ASSESS: Lauren Huffman has the diagnosis of obesity and her BMI today is 41.3. Lauren Huffman is in the action stage of change.   ADVISE: Lauren Huffman was educated on the multiple health risks of obesity as well as the benefit of weight loss to improve her health. She was advised of the need for long term treatment and the importance of lifestyle modifications to improve her current health and to decrease her risk of future health problems.  AGREE: Multiple dietary modification options and treatment options were discussed and Lauren Huffman agreed to follow the recommendations documented in the above note.  ARRANGE: Lauren Huffman was educated on the importance of frequent visits to treat obesity as outlined per CMS and USPSTF guidelines and agreed to schedule her next follow up appointment today.  Attestation Statements:   Reviewed by clinician on day of visit: allergies, medications, problem list, medical history, surgical history, family history, social history, and previous encounter  notes.  I, Lauren Huffman, CMA, am acting as transcriptionist for Helane Rima, DO  I have reviewed the above documentation for accuracy and completeness, and I agree with the above. Helane Rima, DO

## 2019-11-14 NOTE — Addendum Note (Signed)
Addended by: Cleda Mccreedy on: 11/14/2019 02:32 PM   Modules accepted: Orders

## 2019-11-15 ENCOUNTER — Encounter (INDEPENDENT_AMBULATORY_CARE_PROVIDER_SITE_OTHER): Payer: Self-pay | Admitting: Family Medicine

## 2019-11-15 LAB — BASIC METABOLIC PANEL
BUN/Creatinine Ratio: 26 (ref 12–28)
BUN: 19 mg/dL (ref 8–27)
CO2: 26 mmol/L (ref 20–29)
Calcium: 9.4 mg/dL (ref 8.7–10.3)
Chloride: 101 mmol/L (ref 96–106)
Creatinine, Ser: 0.74 mg/dL (ref 0.57–1.00)
GFR calc Af Amer: 98 mL/min/{1.73_m2} (ref >59)
GFR calc non Af Amer: 85 mL/min/{1.73_m2} (ref >59)
Glucose: 83 mg/dL (ref 65–99)
Potassium: 5.4 mmol/L — ABNORMAL HIGH (ref 3.5–5.2)
Sodium: 142 mmol/L (ref 134–144)

## 2019-11-15 NOTE — Telephone Encounter (Signed)
Please advise 

## 2019-11-17 ENCOUNTER — Other Ambulatory Visit: Payer: Self-pay

## 2019-11-17 DIAGNOSIS — R0609 Other forms of dyspnea: Secondary | ICD-10-CM

## 2019-11-17 DIAGNOSIS — I471 Supraventricular tachycardia: Secondary | ICD-10-CM

## 2019-11-17 DIAGNOSIS — Z8249 Family history of ischemic heart disease and other diseases of the circulatory system: Secondary | ICD-10-CM

## 2019-11-30 ENCOUNTER — Other Ambulatory Visit: Payer: Medicare Other | Admitting: *Deleted

## 2019-11-30 ENCOUNTER — Ambulatory Visit (HOSPITAL_COMMUNITY): Payer: Medicare Other | Attending: Cardiology

## 2019-11-30 ENCOUNTER — Other Ambulatory Visit: Payer: Self-pay

## 2019-11-30 DIAGNOSIS — R0600 Dyspnea, unspecified: Secondary | ICD-10-CM | POA: Diagnosis not present

## 2019-11-30 DIAGNOSIS — I471 Supraventricular tachycardia: Secondary | ICD-10-CM

## 2019-11-30 DIAGNOSIS — R0609 Other forms of dyspnea: Secondary | ICD-10-CM

## 2019-11-30 DIAGNOSIS — Z8249 Family history of ischemic heart disease and other diseases of the circulatory system: Secondary | ICD-10-CM

## 2019-11-30 LAB — ECHOCARDIOGRAM COMPLETE
Area-P 1/2: 5.38 cm2
S' Lateral: 3.2 cm

## 2019-12-01 ENCOUNTER — Other Ambulatory Visit: Payer: Medicare Other

## 2019-12-01 ENCOUNTER — Telehealth (HOSPITAL_COMMUNITY): Payer: Self-pay | Admitting: Emergency Medicine

## 2019-12-01 LAB — BASIC METABOLIC PANEL
BUN/Creatinine Ratio: 25 (ref 12–28)
BUN: 20 mg/dL (ref 8–27)
CO2: 26 mmol/L (ref 20–29)
Calcium: 9.8 mg/dL (ref 8.7–10.3)
Chloride: 102 mmol/L (ref 96–106)
Creatinine, Ser: 0.79 mg/dL (ref 0.57–1.00)
GFR calc Af Amer: 90 mL/min/{1.73_m2} (ref >59)
GFR calc non Af Amer: 78 mL/min/{1.73_m2} (ref >59)
Glucose: 80 mg/dL (ref 65–99)
Potassium: 5 mmol/L (ref 3.5–5.2)
Sodium: 141 mmol/L (ref 134–144)

## 2019-12-01 NOTE — Telephone Encounter (Signed)
Attempted to call patient regarding upcoming cardiac CT appointment. °Left message on voicemail with name and callback number °Bridgette Wolden RN Navigator Cardiac Imaging °[facility] °[phone] Office °[phone] Cell ° °

## 2019-12-04 ENCOUNTER — Other Ambulatory Visit: Payer: Self-pay

## 2019-12-04 ENCOUNTER — Ambulatory Visit (HOSPITAL_COMMUNITY)
Admission: RE | Admit: 2019-12-04 | Discharge: 2019-12-04 | Disposition: A | Discharge status: Home / Self Care | Payer: Medicare Other | Source: Ambulatory Visit | Attending: Physician Assistant | Admitting: Physician Assistant

## 2019-12-04 DIAGNOSIS — R0600 Dyspnea, unspecified: Secondary | ICD-10-CM | POA: Diagnosis not present

## 2019-12-04 DIAGNOSIS — K449 Diaphragmatic hernia without obstruction or gangrene: Secondary | ICD-10-CM | POA: Insufficient documentation

## 2019-12-04 DIAGNOSIS — I2510 Atherosclerotic heart disease of native coronary artery without angina pectoris: Secondary | ICD-10-CM

## 2019-12-04 DIAGNOSIS — R0609 Other forms of dyspnea: Secondary | ICD-10-CM

## 2019-12-04 MED ORDER — NITROGLYCERIN 0.4 MG SL SUBL
0.8000 mg | SUBLINGUAL_TABLET | Freq: Once | SUBLINGUAL | Status: AC
  Administered 2019-12-04: 0.8 mg via SUBLINGUAL

## 2019-12-04 MED ORDER — NITROGLYCERIN 0.4 MG SL SUBL
SUBLINGUAL_TABLET | SUBLINGUAL | Status: AC
  Filled 2019-12-04: qty 2

## 2019-12-04 MED ORDER — IOHEXOL 350 MG/ML SOLN
80.0000 mL | Freq: Once | INTRAVENOUS | Status: AC | PRN
  Administered 2019-12-04: 80 mL via INTRAVENOUS

## 2019-12-05 ENCOUNTER — Ambulatory Visit (HOSPITAL_COMMUNITY)
Admission: RE | Admit: 2019-12-05 | Discharge: 2019-12-05 | Disposition: A | Discharge status: Home / Self Care | Payer: Medicare Other | Source: Ambulatory Visit | Attending: Cardiology | Admitting: Cardiology

## 2019-12-05 ENCOUNTER — Other Ambulatory Visit: Payer: Self-pay | Admitting: Cardiology

## 2019-12-05 DIAGNOSIS — R0609 Other forms of dyspnea: Secondary | ICD-10-CM

## 2019-12-05 DIAGNOSIS — R0600 Dyspnea, unspecified: Secondary | ICD-10-CM | POA: Diagnosis not present

## 2019-12-06 ENCOUNTER — Telehealth: Payer: Self-pay

## 2019-12-06 DIAGNOSIS — R931 Abnormal findings on diagnostic imaging of heart and coronary circulation: Secondary | ICD-10-CM

## 2019-12-06 DIAGNOSIS — R0600 Dyspnea, unspecified: Secondary | ICD-10-CM | POA: Diagnosis not present

## 2019-12-06 DIAGNOSIS — E782 Mixed hyperlipidemia: Secondary | ICD-10-CM

## 2019-12-06 DIAGNOSIS — Z6841 Body Mass Index (BMI) 40.0 and over, adult: Secondary | ICD-10-CM

## 2019-12-06 MED ORDER — ATORVASTATIN CALCIUM 40 MG PO TABS: 40.0000 mg | ORAL_TABLET | Freq: Every day | ORAL | 3 refills | Status: DC

## 2019-12-06 MED ORDER — ISOSORBIDE MONONITRATE ER 30 MG PO TB24: 30.0000 mg | ORAL_TABLET | Freq: Every day | ORAL | 3 refills | Status: DC

## 2019-12-06 MED ORDER — ASPIRIN EC 81 MG PO TBEC: 81.0000 mg | DELAYED_RELEASE_TABLET | Freq: Every day | ORAL | 3 refills | Status: AC

## 2019-12-06 NOTE — Telephone Encounter (Signed)
-----   Message from Quintella Reichert, MD sent at 12/06/2019 12:51 PM EDT ----- Coronary FFR shows that there is obstructive plaque in mid diagonal branch but main epicardial arteries have normal flow. Recommend medical management.  Start ASA 81mg  daily and Imdur 30mg  daily. Change atorvastatin to 40mg  daily and repeat FLP and ALT in 6 weeks.  HbA1C normal.  Followup with , PA in 2 weeks

## 2019-12-06 NOTE — Telephone Encounter (Signed)
The patient has been notified of the result and verbalized understanding.  All questions (if any) were answered. Theresia Majors, RN 12/06/2019 5:38 PM  Patient is scheduled for follow up with Ronie Spies, PA-C on 09/17. Patient is agreeable to medications changes. Rx have been sent in.

## 2019-12-13 ENCOUNTER — Ambulatory Visit (INDEPENDENT_AMBULATORY_CARE_PROVIDER_SITE_OTHER): Payer: Medicare Other | Admitting: Family Medicine

## 2019-12-13 ENCOUNTER — Other Ambulatory Visit: Payer: Self-pay

## 2019-12-13 ENCOUNTER — Encounter (INDEPENDENT_AMBULATORY_CARE_PROVIDER_SITE_OTHER): Payer: Self-pay | Admitting: Family Medicine

## 2019-12-13 VITALS — BP 110/74 | HR 111 | Temp 98.3°F | Ht 68.0 in | Wt 269.0 lb

## 2019-12-13 DIAGNOSIS — E8881 Metabolic syndrome: Secondary | ICD-10-CM | POA: Diagnosis not present

## 2019-12-13 DIAGNOSIS — Z6841 Body Mass Index (BMI) 40.0 and over, adult: Secondary | ICD-10-CM

## 2019-12-13 DIAGNOSIS — E782 Mixed hyperlipidemia: Secondary | ICD-10-CM | POA: Diagnosis not present

## 2019-12-13 DIAGNOSIS — F319 Bipolar disorder, unspecified: Secondary | ICD-10-CM

## 2019-12-13 DIAGNOSIS — E559 Vitamin D deficiency, unspecified: Secondary | ICD-10-CM

## 2019-12-14 ENCOUNTER — Other Ambulatory Visit (INDEPENDENT_AMBULATORY_CARE_PROVIDER_SITE_OTHER): Payer: Self-pay | Admitting: Family Medicine

## 2019-12-14 DIAGNOSIS — E559 Vitamin D deficiency, unspecified: Secondary | ICD-10-CM

## 2019-12-14 MED ORDER — VITAMIN D (ERGOCALCIFEROL) 1.25 MG (50000 UNIT) PO CAPS: 50000.0000 [IU] | ORAL_CAPSULE | ORAL | 0 refills | Status: DC

## 2019-12-14 NOTE — Progress Notes (Signed)
Chief Complaint:   OBESITY Lauren Huffman is here to discuss her progress with her obesity treatment plan along with follow-up of her obesity related diagnoses. Lauren Huffman is on the Category 2 Plan and states she is following her eating plan approximately 60% of the time. Lauren Huffman states she is exercising for 0 minutes 0 times per week.  Today's visit was #: 4 Starting weight: 268 lbs Starting date: 10/03/2019 Today's weight: 269 lbs Today's date: 12/13/2019 Total lbs lost since last in-office visit: 2 lbs  Interim History: Lauren Huffman says she is now on Imdur and aspirin 81 mg daily.  Her atorvastatin was increased to 40 mg daily.  She is off Remeron.  She feels she has more energy and her sleep is still okay.  She has taken Wellbutrin 300 mg daily for the last 7 days consistently.  Assessment/Plan:   1. Bipolar affective disorder (HCC) Took herself off of Remeron.  No issues.  She is going to therapy.  Goals:  Take medications consistently.  2. Insulin resistance Goal is HgbA1c < 5.7 and insulin level closer to 5. Lauren Huffman will continue to work on weight loss, exercise, and decreasing simple carbohydrates to help decrease the risk of diabetes.   Lab Results  Component Value Date   INSULIN 11.5 10/03/2019   Lab Results  Component Value Date   HGBA1C 5.4 10/03/2019   3. Vitamin D deficiency Current vitamin D is 35.5, tested on 10/03/2019. Not at goal. Optimal goal > 50 ng/dL. There is also evidence to support a goal of >70 ng/dL in patients with cancer and heart disease. Plan: Continue Vitamin D @50 ,000 IU every week with follow-up for routine testing of Vitamin D at least 2-3 times per year to avoid over-replacement.  -Refill Vitamin D, Ergocalciferol, (DRISDOL) 1.25 MG (50000 UNIT) CAPS capsule; Take 1 capsule (50,000 Units total) by mouth every 7 (seven) days.  Dispense: 4 capsule; Refill: 0  4. Mixed hyperlipidemia Lauren Huffman has hyperlipidemia and has been trying to improve her cholesterol levels with intensive  lifestyle modification including a low saturated fat diet, exercise and weight loss. She denies any chest pain, claudication or myalgias.  Lab Results  Component Value Date   ALT 15 10/03/2019   AST 16 10/03/2019   ALKPHOS 115 10/03/2019   BILITOT 0.4 10/03/2019   Lab Results  Component Value Date   CHOL 276 (H) 10/03/2019   HDL 52 10/03/2019   LDLCALC 194 (H) 10/03/2019   TRIG 163 (H) 10/03/2019   5. Class 3 severe obesity with serious comorbidity and body mass index (BMI) of 40.0 to 44.9 in adult, unspecified obesity type [facility]) Lauren Huffman is currently in the action stage of change. As such, her goal is to continue with weight loss efforts. She has agreed to the Category 3 Plan.   Exercise goals: Older adults should follow the adult guidelines. When older adults cannot meet the adult guidelines, they should be as physically active as their abilities and conditions will allow.  Older adults should do exercises that maintain or improve balance if they are at risk of falling.   Behavioral modification strategies: increasing lean protein intake, decreasing simple carbohydrates and increasing vegetables.  Lauren Huffman has agreed to follow-up with our clinic in 2-3 weeks. She was informed of the importance of frequent follow-up visits to maximize her success with intensive lifestyle modifications for her multiple health conditions.   Objective:   Blood pressure 110/74, pulse (!) 111, temperature 98.3 F (36.8 C), temperature source Oral, height  5\' 8"  (1.727 m), weight 269 lb (122 kg), SpO2 95 %. Body mass index is 40.9 kg/m.  General: Cooperative, alert, well developed, in no acute distress. HEENT: Conjunctivae and lids unremarkable. Cardiovascular: Regular rhythm.  Lungs: Normal work of breathing. Neurologic: No focal deficits.   Lab Results  Component Value Date   CREATININE 0.79 11/30/2019   BUN 20 11/30/2019   NA 141 11/30/2019   K 5.0 11/30/2019   CL 102 11/30/2019   CO2 26 11/30/2019    Lab Results  Component Value Date   ALT 15 10/03/2019   AST 16 10/03/2019   ALKPHOS 115 10/03/2019   BILITOT 0.4 10/03/2019   Lab Results  Component Value Date   HGBA1C 5.4 10/03/2019   Lab Results  Component Value Date   INSULIN 11.5 10/03/2019   Lab Results  Component Value Date   TSH 2.820 10/03/2019   Lab Results  Component Value Date   CHOL 276 (H) 10/03/2019   HDL 52 10/03/2019   LDLCALC 194 (H) 10/03/2019   TRIG 163 (H) 10/03/2019   Lab Results  Component Value Date   WBC 8.7 10/03/2019   HGB 15.9 10/03/2019   HCT 45.7 10/03/2019   MCV 89 10/03/2019   PLT 248 10/03/2019   Lab Results  Component Value Date   IRON 81 10/03/2019   TIBC 316 10/03/2019   FERRITIN 85 10/03/2019   Obesity Behavioral Intervention:   Approximately 15 minutes were spent on the discussion below.  ASK: We discussed the diagnosis of obesity with Lauren Huffman today and Lauren Huffman agreed to give Feb permission to discuss obesity behavioral modification therapy today.  ASSESS: Lauren Huffman has the diagnosis of obesity and her BMI today is 41.0. Lauren Huffman is in the action stage of change.   ADVISE: Lauren Huffman was educated on the multiple health risks of obesity as well as the benefit of weight loss to improve her health. She was advised of the need for long term treatment and the importance of lifestyle modifications to improve her current health and to decrease her risk of future health problems.  AGREE: Multiple dietary modification options and treatment options were discussed and Lauren Huffman agreed to follow the recommendations documented in the above note.  ARRANGE: Lauren Huffman was educated on the importance of frequent visits to treat obesity as outlined per CMS and USPSTF guidelines and agreed to schedule her next follow up appointment today.  Attestation Statements:   Reviewed by clinician on day of visit: allergies, medications, problem list, medical history, surgical history, family history, social history, and previous  encounter notes.  I, Feb, CMA, am acting as transcriptionist for Insurance claims handler, DO  I have reviewed the above documentation for accuracy and completeness, and I agree with the above. Helane Rima, DO

## 2019-12-20 ENCOUNTER — Encounter: Payer: Self-pay | Admitting: Physician Assistant

## 2019-12-22 ENCOUNTER — Ambulatory Visit: Payer: Medicare Other | Admitting: Physician Assistant

## 2019-12-27 ENCOUNTER — Telehealth: Payer: Self-pay | Admitting: Physician Assistant

## 2019-12-27 NOTE — Telephone Encounter (Signed)
New message:    Patient calling to ask if it is ok to do the VV or do she need to come in. please call patient.

## 2019-12-27 NOTE — Telephone Encounter (Signed)
Spoke with the patient who is wanting to make sure that it is okay for her follow up appointment to be virtual. I advised her that keeping the virtual appointment would be okay. Patient thanked me for the call.

## 2019-12-28 ENCOUNTER — Other Ambulatory Visit (INDEPENDENT_AMBULATORY_CARE_PROVIDER_SITE_OTHER): Payer: Self-pay | Admitting: Family Medicine

## 2019-12-28 DIAGNOSIS — E559 Vitamin D deficiency, unspecified: Secondary | ICD-10-CM

## 2020-01-03 ENCOUNTER — Telehealth (INDEPENDENT_AMBULATORY_CARE_PROVIDER_SITE_OTHER): Payer: Medicare Other | Admitting: Family Medicine

## 2020-01-03 ENCOUNTER — Encounter (INDEPENDENT_AMBULATORY_CARE_PROVIDER_SITE_OTHER): Payer: Self-pay | Admitting: Family Medicine

## 2020-01-03 VITALS — BP 132/88 | HR 96 | Ht 68.0 in | Wt 271.0 lb

## 2020-01-03 DIAGNOSIS — E8881 Metabolic syndrome: Secondary | ICD-10-CM | POA: Diagnosis not present

## 2020-01-03 DIAGNOSIS — Z6841 Body Mass Index (BMI) 40.0 and over, adult: Secondary | ICD-10-CM

## 2020-01-03 DIAGNOSIS — E559 Vitamin D deficiency, unspecified: Secondary | ICD-10-CM | POA: Diagnosis not present

## 2020-01-03 DIAGNOSIS — E88819 Insulin resistance, unspecified: Secondary | ICD-10-CM

## 2020-01-03 DIAGNOSIS — E782 Mixed hyperlipidemia: Secondary | ICD-10-CM | POA: Diagnosis not present

## 2020-01-03 MED ORDER — VITAMIN D (ERGOCALCIFEROL) 1.25 MG (50000 UNIT) PO CAPS: 50000.0000 [IU] | ORAL_CAPSULE | ORAL | 0 refills | Status: DC

## 2020-01-03 NOTE — Progress Notes (Signed)
Virtual Visit via Video Note   This visit type was conducted due to national recommendations for restrictions regarding the COVID-19 Pandemic (e.g. social distancing) in an effort to limit this patient's exposure and mitigate transmission in our community.  Due to her co-morbid illnesses, this patient is at least at moderate risk for complications without adequate follow up.  This format is felt to be most appropriate for this patient at this time.  All issues noted in this document were discussed and addressed.  A limited physical exam was performed with this format.  Please refer to the patient's chart for her consent to telehealth for [facility].     Date:  01/09/2020   ID:  Lauren Huffman, DOB 05-04-1955, MRN [id number] The patient was identified using 2 identifiers.  Patient Location: Home Provider Location: Office/Clinic  PCP:  Gweneth DimitriMcNeill, Wendy, MD  Cardiologist:  Armanda Magicraci Turner, MD  Electrophysiologist:  None   Evaluation Performed:  Follow-Up Visit  Chief Complaint:  Follow up  History of Present Illness:    Lauren Huffman is a 64 y.o. female with a  history of SVT,carpal tunnel, HTN, HLD, depression, anxiety, sleep apnea (unable to tolerate CPAP) & varicose veins s/p ablation. FH + for CAD.    She was in the ER October 2018 with symptomatic palpitations. Found to be in SVT - treated with Adenosine and converted - then started on CCB which she hasn't tolerated. She reported prior side effects with Metoprolol. She has not been able to tolerate CPAP. Echo was basically normal. She declined event monitor due to cost.    I saw the patient 11/14/2019 for dyspnea on exertion after she had canceled 3 appointments because of anxiety.  The echo 11/30/2019 normal LVEF 60 to 65% grade 1 DD.  Coronary CTA 12/04/2019 coronary calcium score 156 85th percentile for age and sex matched control moderate 50 to 69% proximal and greater than 70% mixed plaque in the LAD and diagonal.  Showed there is  obstructive plaque in the mid diagonal branch but main epicardial arteries have normal flow.  Dr. Mayford Knifeurner reviewed recommended aspirin 81 mg daily Imdur 30 mg daily and changed her to atorvastatin 40 mg daily.  Patient called in complaining of weekly episodes of dizziness and sweating occurring at night. Was to check her BP during these episodes.Hasn't had an episode for over a week now. DOE has improved. No regular exercise.Patient's BP is up today but machine acting up and she became frustrated with it. Tolerating Imdur. Avoids salt. No chest pain or cardiac complaints.  The patient does not have symptoms concerning for COVID-19 infection (fever, chills, cough, or new shortness of breath).    Past Medical History:  Diagnosis Date   Aneurysm, lower extremity (HCC)    Anxiety    Arthritis    Bipolar 2 disorder (HCC)    CAD (coronary artery disease)    a. by Cor CTA 11/2019.   Carpal tunnel syndrome    Right   Chronic pain    Depression    GERD (gastroesophageal reflux disease)    Headache(784.0)    Hiatal hernia    History of colon polyps    History of iron deficiency    Hyperlipemia    Hypertension    Joint pain    Lactose intolerance    Left arm pain    Lower extremity edema    Memory difficulty    Mental disorder    Obesity    OSA (obstructive sleep  apnea)    Osteoarthritis    Prediabetes    Pulmonary nodules    Sleep apnea    DOES NOT WEAR CPAP     Sleep disturbance    SOB (shortness of breath)    Status post ablation of incompetent vein using laser 2x in Nov. 2017   right   SVT (supraventricular tachycardia) (HCC)    TMJ (dislocation of temporomandibular joint)    Varicose veins    Vitamin D insufficiency    Past Surgical History:  Procedure Laterality Date   CYST REMOVAL HAND Left    JOINT REPLACEMENT Right    knee   KNEE ARTHROPLASTY  12/25/2011   Procedure: COMPUTER ASSISTED TOTAL KNEE ARTHROPLASTY;  Surgeon: [name], MD;  Location: [facility] OR;  Service: Orthopedics;  Laterality: Right;  Right total knee replacement, general anesthesia, femoral nerve block   KNEE ARTHROPLASTY Left 07/08/2012   Procedure: COMPUTER ASSISTED TOTAL KNEE ARTHROPLASTY;  Surgeon: [name], MD;  Location: [facility] OR;  Service: Orthopedics;  Laterality: Left;  PRE OP FEMORAL NERVE BLOCK   lazer vein surgery  2017   right leg   TOTAL HIP ARTHROPLASTY Right 03/27/2016   TOTAL HIP ARTHROPLASTY Right 03/27/2016   Procedure: TOTAL HIP ARTHROPLASTY ANTERIOR APPROACH;  Surgeon: [name], MD;  Location: [facility] OR;  Service: Orthopedics;  Laterality: Right;   TOTAL KNEE ARTHROPLASTY Left 07/08/2012   [name]     Current Meds  Medication Sig   aspirin EC 81 MG tablet Take 1 tablet (81 mg total) by mouth daily. Swallow whole.   atorvastatin (LIPITOR) 40 MG tablet Take 1 tablet (40 mg total) by mouth daily.   buPROPion (WELLBUTRIN XL) 150 MG 24 hr tablet Take 300 mg by mouth daily.    Calcium-Phosphorus-Vitamin D (CALCIUM GUMMIES PO) Take 500 mg by mouth.   colestipol (COLESTID) 1 g tablet Take 2 g by mouth in the morning.   Cyanocobalamin 3000 MCG CAPS Take 3,000 capsules by mouth every other day.   divalproex (DEPAKOTE ER) 500 MG 24 hr tablet 1/2 tab AM,- 1 tab PM   ELDERBERRY PO Take by mouth daily.   ferrous sulfate 325 (65 FE) MG tablet Take 325 mg by mouth daily with breakfast.   folic acid (FOLVITE) 1 MG tablet Take 2 mg by mouth daily.    isosorbide mononitrate (IMDUR) 30 MG 24 hr tablet Take 1 tablet (30 mg total) by mouth daily.   loratadine (CLARITIN) 10 MG tablet Take 10 mg by mouth daily.   Multiple Vitamins-Minerals (MULTIVITAMIN GUMMIES ADULT PO) Take 1 tablet by mouth daily.    omeprazole (PRILOSEC) 40 MG capsule Take 40 mg by mouth 2 (two) times daily.    Probiotic Product (ALIGN) CHEW Chew 1 tablet by mouth daily.   risperiDONE (RISPERDAL) 0.5 MG tablet Take 0.5 mg by mouth at bedtime.   TURMERIC  PO Take 1,075 mg by mouth daily.   vitamin C (ASCORBIC ACID) 500 MG tablet Take 500 mg by mouth daily.   Vitamin D, Ergocalciferol, (DRISDOL) 1.25 MG (50000 UNIT) CAPS capsule Take 1 capsule (50,000 Units total) by mouth every 7 (seven) days.   [DISCONTINUED] Cyanocobalamin (VITAMIN B-12) 3000 MCG SUBL Place 3,000 mcg under the tongue daily.      Allergies:   Cartia xt [diltiazem hcl er beads], Gabapentin, Metoprolol, Sulfa antibiotics, and Trazodone   Social History   Tobacco Use   Smoking status: Never Smoker   Smokeless tobacco: Never Used  Vaping Use  Vaping Use: Never used  Substance Use Topics   Alcohol use: Not Currently   Drug use: No     Family Hx: The patient's family history includes CAD in her mother; Cancer in her father; Diabetes in her father; Heart disease in her father and mother; Hyperlipidemia in her mother and sister; Memory loss in her mother; Seizures in her mother; Stroke in her mother; Supraventricular tachycardia in her sister; Thyroid disease in her mother.  ROS:   Please see the history of present illness.     All other systems reviewed and are negative.   Prior CV studies:   The following studies were reviewed today: Coronary CTA and FFR 9/2021IMPRESSION: 1. Coronary calcium score of 156. This was 85th percentile for age and sex matched control.   2. Normal coronary origin with right dominance.   3. Moderate (50-69%) proximal and severe (>70%) mixed plaque in the LAD and D1. CAD-RADS 4.   4.  Consider cardiac catheterization.   Chilton Si, MD     Electronically Signed   By: Chilton Si   On: 12/04/2019 17:39 FINDINGS: FFRct analysis was performed on the original cardiac CT angiogram dataset. Diagrammatic representation of the FFRct analysis is provided in a separate PDF document in PACS. This dictation was created using the PDF document and an interactive 3D model of the results. 3D model is not available in the  EMR/PACS. Normal FFR range is >0.80.   1. Left Main: No significant stenosis. LM FFR = 0.99.   2. LAD: No significant stenosis. Proximal FFR = 0.99, Mid FFR = 0.97, Distal FFR = 0.93.   3. LCX: No significant stenosis. Proximal FFR = 0.98, Mid FFR = 0.97, Distal FFR = 0.95   4. OM1: No significant stenosis. Proximal FFR = 0.98, Mid to distal FFR = 0.87.   5. RCA: No significant stenosis. Proximal FFR = 0.98, Mid FFR = 0.95, Distal FFR = 0.90.   IMPRESSION:: IMPRESSION: 1. Coronary CT FFR flow analysis demonstrates possible flow limiting lesion in the diagonal #1 branch (FFR 0.79 mid and 0.76 distal). Normal flow in RCA, LCX and LAD.   2.  Recommend medical management.   Traci Turner     Electronically Signed   By: Armanda Magic   On: 12/06/2019 12:46  2D echo 8/2021IMPRESSIONS     1. Left ventricular ejection fraction, by estimation, is 60 to 65%. The  left ventricle has normal function. The left ventricle has no regional  wall motion abnormalities. Left ventricular diastolic parameters are  consistent with Grade I diastolic  dysfunction (impaired relaxation). The average left ventricular global  longitudinal strain is -18.5 %. The global longitudinal strain is normal.   2. Right ventricular systolic function is normal. The right ventricular  size is normal. There is normal pulmonary artery systolic pressure.   3. The mitral valve is normal in structure. No evidence of mitral valve  regurgitation. No evidence of mitral stenosis.   4. The aortic valve is normal in structure. Aortic valve regurgitation is  not visualized. No aortic stenosis is present.   5. The inferior vena cava is normal in size with greater than 50%  respiratory variability, suggesting right atrial pressure of 3 mmHg.   Comparison(s): 02/03/17 EF 55-60%.      Labs/Other Tests and Data Reviewed:    EKG:  No ECG reviewed.  Recent Labs: 10/03/2019: ALT 15; Hemoglobin 15.9; Platelets 248; TSH  2.820 11/30/2019: BUN 20; Creatinine, Ser 0.79; Potassium 5.0; Sodium  141   Recent Lipid Panel Lab Results  Component Value Date/Time   CHOL 276 (H) 10/03/2019 02:50 PM   TRIG 163 (H) 10/03/2019 02:50 PM   HDL 52 10/03/2019 02:50 PM   LDLCALC 194 (H) 10/03/2019 02:50 PM    Wt Readings from Last 3 Encounters:  01/09/20 271 lb (122.9 kg)  01/03/20 271 lb (122.9 kg)  12/13/19 269 lb (122 kg)     Objective:    Vital Signs:  BP (!) 137/94    Pulse 91    Ht 5\' 8"  (1.727 m)    Wt 271 lb (122.9 kg)    BMI 41.21 kg/m    VITAL SIGNS:  reviewed GEN:  no acute distress RESPIRATORY:  normal respiratory effort, symmetric expansion CARDIOVASCULAR:  no peripheral edema  ASSESSMENT & PLAN:    CAD with obstructive plaque in the mid diagonal branch on coronary CTA and FFR see above for details.  Medical management recommended on aspirin, Imdur and atorvastatin.  Patient was complaining of some dizzy spells that occurred about once a week associated with a flushed feeling but has not had in over a week now.  She will check her blood pressure and pulse if she has any further  episodes and let know what they are.  Otherwise continue Imdur aspirin and atorvastatin.  For follow-up labs in November.  Follow-up with Dr. December in 6 months.  Dyspnea on exertion resolved  History of SVT T no recurrence  History of OSA could not tolerate CPAP  Hyperlipidemia now on increased atorvastatin for labs in November  Family history of CAD  Obesity going to weight loss center.  Recommend 150 minutes of exercise weekly.     COVID-19 Education: The signs and symptoms of COVID-19 were discussed with the patient and how to seek care for testing (follow up with PCP or arrange E-visit).   The importance of social distancing was discussed today.  Time:   Today, I have spent 10 minutes with the patient with telehealth technology discussing the above problems.     Medication Adjustments/Labs and Tests  Ordered: Current medicines are reviewed at length with the patient today.  Concerns regarding medicines are outlined above.   Tests Ordered: No orders of the defined types were placed in this encounter.   Medication Changes: No orders of the defined types were placed in this encounter.   Follow Up:  Virtual Visit  in 6 month(s) Dr. December  Signed, Mayford Knife, PA-C  01/09/2020 11:32 AM    [facility]

## 2020-01-04 NOTE — Progress Notes (Signed)
TeleHealth Visit:  Due to the COVID-19 pandemic, this visit was completed with telemedicine (audio/video) technology to reduce patient and provider exposure as well as to preserve personal protective equipment.   Lauren Huffman has verbally consented to this TeleHealth visit. The patient is located at home, the provider is located at the [facility] office. The participants in this visit include the listed provider and patient. The visit was conducted today via video.  Chief Complaint: OBESITY Lauren Huffman is here to discuss her progress with her obesity treatment plan along with follow-up of her obesity related diagnoses. Lauren Huffman is on the Category 2 Plan and states she is following her eating plan approximately 60% of the time. Lauren Huffman states she has increased her walking.  Today's visit was #: 5 Starting weight: 268 lbs Starting date: 10/03/2019  Interim History: Lauren Huffman says she is flushed, hot, sweating, and dizzy at night.  She will se Dr. Suezanne Cheshire next week.  Blood pressure is 130/88 today.    Plan:  She will report to Dr. Suezanne Cheshire (Imdur recently added).  Advised her to monitor blood pressure, blood sugars.  Assessment/Plan:   1. Vitamin D deficiency Current vitamin D is 35.5, tested on 10/03/2019. Not at goal. Optimal goal > 50 ng/dL. There is also evidence to support a goal of >70 ng/dL in patients with cancer and heart disease. Plan: Continue Vitamin D @50 ,000 IU every week with follow-up for routine testing of Vitamin D at least 2-3 times per year to avoid over-replacement.  - Vitamin D, Ergocalciferol, (DRISDOL) 1.25 MG (50000 UNIT) CAPS capsule; Take 1 capsule (50,000 Units total) by mouth every 7 (seven) days.  Dispense: 4 capsule; Refill: 0  2. Insulin resistance She will continue to focus on protein-rich, low simple carbohydrate foods. We reviewed the importance of hydration, regular exercise for stress reduction, and restorative sleep.   Lab Results  Component Value Date   INSULIN 11.5  10/03/2019   Lab Results  Component Value Date   HGBA1C 5.4 10/03/2019   3. Mixed hyperlipidemia  Lab Results  Component Value Date   CHOL 276 (H) 10/03/2019   HDL 52 10/03/2019   LDLCALC 194 (H) 10/03/2019   TRIG 163 (H) 10/03/2019   Lab Results  Component Value Date   ALT 15 10/03/2019   AST 16 10/03/2019   ALKPHOS 115 10/03/2019   BILITOT 0.4 10/03/2019   The 10-year ASCVD risk score 10/05/2019 DC Jr., et al., 2013) is: 10.4%   Values used to calculate the score:     Age: 64 years     Sex: Female     Is Non-Hispanic African American: No     Diabetic: No     Tobacco smoker: No     Systolic Blood Pressure: 132 mmHg     Is BP treated: Yes     HDL Cholesterol: 52 mg/dL     Total Cholesterol: 276 mg/dL  Course: Not at goal. Target levels for LDL are: < 70. Plan: Dietary changes: Increase soluble fiber. Decrease simple carbohydrates. Exercise changes: An average 40 minutes of moderate to vigorous-intensity aerobic activity 3 or 4 times per week. Lipid-lowering medications: Lipitor.   4. Class 3 severe obesity with serious comorbidity and body mass index (BMI) of 40.0 to 44.9 in adult, unspecified obesity type [facility])  Lauren Huffman is currently in the action stage of change. As such, her goal is to continue with weight loss efforts. She has agreed to the Category 2 Plan.   Exercise goals: As  tolerated.  Behavioral modification strategies: increasing lean protein intake, increasing water intake and decreasing sodium intake.  Lauren Huffman has agreed to follow-up with our clinic in 2 weeks. She was informed of the importance of frequent follow-up visits to maximize her success with intensive lifestyle modifications for her multiple health conditions.  Objective:   VITALS: Per patient if applicable, see vitals. GENERAL: Alert and in no acute distress. CARDIOPULMONARY: No increased WOB. Speaking in clear sentences.  PSYCH: Pleasant and cooperative. Speech normal rate and rhythm. Affect is  appropriate. Insight and judgement are appropriate. Attention is focused, linear, and appropriate.  NEURO: Oriented as arrived to appointment on time with no prompting.   Lab Results  Component Value Date   CREATININE 0.79 11/30/2019   BUN 20 11/30/2019   NA 141 11/30/2019   K 5.0 11/30/2019   CL 102 11/30/2019   CO2 26 11/30/2019   Lab Results  Component Value Date   ALT 15 10/03/2019   AST 16 10/03/2019   ALKPHOS 115 10/03/2019   BILITOT 0.4 10/03/2019   Lab Results  Component Value Date   HGBA1C 5.4 10/03/2019   Lab Results  Component Value Date   INSULIN 11.5 10/03/2019   Lab Results  Component Value Date   TSH 2.820 10/03/2019   Lab Results  Component Value Date   CHOL 276 (H) 10/03/2019   HDL 52 10/03/2019   LDLCALC 194 (H) 10/03/2019   TRIG 163 (H) 10/03/2019   Lab Results  Component Value Date   WBC 8.7 10/03/2019   HGB 15.9 10/03/2019   HCT 45.7 10/03/2019   MCV 89 10/03/2019   PLT 248 10/03/2019   Lab Results  Component Value Date   IRON 81 10/03/2019   TIBC 316 10/03/2019   FERRITIN 85 10/03/2019   Attestation Statements:   Reviewed by clinician on day of visit: allergies, medications, problem list, medical history, surgical history, family history, social history, and previous encounter notes.  I, Insurance claims handler, CMA, am acting as transcriptionist for Helane Rima, DO  I have reviewed the above documentation for accuracy and completeness, and I agree with the above. Helane Rima, DO

## 2020-01-09 ENCOUNTER — Telehealth (INDEPENDENT_AMBULATORY_CARE_PROVIDER_SITE_OTHER): Payer: Medicare Other | Admitting: Physician Assistant

## 2020-01-09 ENCOUNTER — Other Ambulatory Visit: Payer: Self-pay

## 2020-01-09 ENCOUNTER — Encounter: Payer: Self-pay | Admitting: Physician Assistant

## 2020-01-09 VITALS — BP 137/94 | HR 91 | Ht 68.0 in | Wt 271.0 lb

## 2020-01-09 DIAGNOSIS — I471 Supraventricular tachycardia: Secondary | ICD-10-CM | POA: Diagnosis not present

## 2020-01-09 DIAGNOSIS — E785 Hyperlipidemia, unspecified: Secondary | ICD-10-CM

## 2020-01-09 DIAGNOSIS — G4733 Obstructive sleep apnea (adult) (pediatric): Secondary | ICD-10-CM

## 2020-01-09 DIAGNOSIS — I2510 Atherosclerotic heart disease of native coronary artery without angina pectoris: Secondary | ICD-10-CM | POA: Diagnosis not present

## 2020-01-09 DIAGNOSIS — R0600 Dyspnea, unspecified: Secondary | ICD-10-CM | POA: Diagnosis not present

## 2020-01-09 DIAGNOSIS — Z6841 Body Mass Index (BMI) 40.0 and over, adult: Secondary | ICD-10-CM

## 2020-01-09 DIAGNOSIS — R0609 Other forms of dyspnea: Secondary | ICD-10-CM

## 2020-01-09 DIAGNOSIS — Z8249 Family history of ischemic heart disease and other diseases of the circulatory system: Secondary | ICD-10-CM

## 2020-01-09 NOTE — Patient Instructions (Signed)
Medication Instructions:  Your physician recommends that you continue on your current medications as directed. Please refer to the Current Medication list given to you today.  *If you need a refill on your cardiac medications before your next appointment, please call your pharmacy*   Lab Work: None If you have labs (blood work) drawn today and your tests are completely normal, you will receive your results only by: Marland Kitchen MyChart Message (if you have MyChart) OR . A paper copy in the mail If you have any lab test that is abnormal or we need to change your treatment, we will call you to review the results.   Testing/Procedures: None   Follow-Up: At [facility], you and your health needs are our priority.  As part of our continuing mission to provide you with exceptional heart care, we have created designated Provider Care Teams.  These Care Teams include your primary Cardiologist (physician) and Advanced Practice Providers (APPs -  Physician Assistants and Nurse Practitioners) who all work together to provide you with the care you need, when you need it.  We recommend signing up for the patient portal called "MyChart".  Sign up information is provided on this After Visit Summary.  MyChart is used to connect with patients for Virtual Visits (Telemedicine).  Patients are able to view lab/test results, encounter notes, upcoming appointments, etc.  Non-urgent messages can be sent to your provider as well.   To learn more about what you can do with MyChart, go to [email or web address].    Your next appointment:   6 month(s)  The format for your next appointment:   Virtual Visit   Provider:   You may see Armanda Magic, MD or one of the following Advanced Practice Providers on your designated Care Team:    Ronie Spies, PA-C  Jacolyn Reedy, PA-C    Other Instructions Check your Blood Pressure for 2 weeks. If it remains high call or send a MyChart message and let us know.

## 2020-01-18 ENCOUNTER — Ambulatory Visit (INDEPENDENT_AMBULATORY_CARE_PROVIDER_SITE_OTHER): Payer: Medicare Other | Admitting: Family Medicine

## 2020-01-23 ENCOUNTER — Ambulatory Visit (INDEPENDENT_AMBULATORY_CARE_PROVIDER_SITE_OTHER): Payer: Medicare Other | Admitting: Family Medicine

## 2020-01-23 ENCOUNTER — Encounter (INDEPENDENT_AMBULATORY_CARE_PROVIDER_SITE_OTHER): Payer: Self-pay | Admitting: Family Medicine

## 2020-01-23 ENCOUNTER — Other Ambulatory Visit: Payer: Self-pay

## 2020-01-23 VITALS — BP 134/87 | HR 106 | Temp 98.6°F | Ht 68.0 in | Wt 271.0 lb

## 2020-01-23 DIAGNOSIS — E7849 Other hyperlipidemia: Secondary | ICD-10-CM

## 2020-01-23 DIAGNOSIS — E559 Vitamin D deficiency, unspecified: Secondary | ICD-10-CM | POA: Diagnosis not present

## 2020-01-23 DIAGNOSIS — I10 Essential (primary) hypertension: Secondary | ICD-10-CM | POA: Diagnosis not present

## 2020-01-23 DIAGNOSIS — F319 Bipolar disorder, unspecified: Secondary | ICD-10-CM

## 2020-01-23 DIAGNOSIS — G4733 Obstructive sleep apnea (adult) (pediatric): Secondary | ICD-10-CM

## 2020-01-23 DIAGNOSIS — Z6841 Body Mass Index (BMI) 40.0 and over, adult: Secondary | ICD-10-CM

## 2020-01-24 MED ORDER — VITAMIN D (ERGOCALCIFEROL) 1.25 MG (50000 UNIT) PO CAPS: 50000.0000 [IU] | ORAL_CAPSULE | ORAL | 0 refills | Status: DC

## 2020-01-24 NOTE — Progress Notes (Signed)
Chief Complaint:   OBESITY Lauren Huffman is here to discuss Lauren Huffman progress with Lauren Huffman obesity treatment plan along with follow-up of Lauren Huffman obesity related diagnoses.   Today's visit was #: 6 Starting weight: 268 lbs Starting date: 10/03/2019 Today's weight: 271 lbs Today's date: 01/23/2020 Total lbs lost to date: +3 lbs Body mass index is 41.21 kg/m.   Interim History: Lauren Huffman had a Cardiology visit on 01/09/2020, which I reviewed.  She says that Lauren Huffman dizziness and hot flashes have subsided.  She has been seeing an therapist and says that it has been helpful.  She generally eats dinner with Lauren Huffman husband and daughter (on Category 3).  Nutrition Plan: Category 2 for 60% of the time. Hunger is moderately controlled controlled. Cravings are moderately controlled controlled.  Activity: She has increased Lauren Huffman amount of walking.  Assessment/Plan:   1. Vitamin D deficiency Current vitamin D is 35.5, tested on 10/03/2019. Not at goal. Optimal goal > 50 ng/dL.  Plan: Continue Vitamin D @50 ,000 IU every week with follow-up for routine testing of Vitamin D at least 2-3 times per year to avoid over-replacement.  - Refill Vitamin D, Ergocalciferol, (DRISDOL) 1.25 MG (50000 UNIT) CAPS capsule; Take 1 capsule (50,000 Units total) by mouth every 7 (seven) days.  Dispense: 4 capsule; Refill: 0  2. OSA (obstructive sleep apnea) Lauren Huffman has a diagnosis of sleep apnea. She was unable to tolerate the CPAP.  Goal: Treatment of OSA via CPAP compliance and/or weight loss. . Plasma ghrelin levels (appetite or "hunger hormone") are significantly higher in OSA patients than in BMI-matched controls, but decrease to levels similar to those of obese patients without OSA after CPAP treatment.  . Weight loss improves OSA by several mechanisms, including reduction in fatty tissue in the throat (i.e. parapharyngeal fat) and the tongue. Loss of abdominal fat increases mediastinal traction on the upper airway making it less likely to  collapse during sleep. . Studies have also shown that compliance with CPAP treatment improves leptin (hunger inhibitory hormone) imbalance.  3. Essential hypertension At goal. Medications: Imdur 30 mg daily. Plan: Monitor home BP. Diet: Avoid buying foods that are: processed, frozen, or prepackaged to avoid excess salt. She patient understands monitoring parameters and red flags.   BP Readings from Last 3 Encounters:  01/23/20 134/87  01/09/20 (!) 137/94  01/03/20 132/88   Lab Results  Component Value Date   CREATININE 0.79 11/30/2019   4. Other hyperlipidemia LDL not at goal on current statin dose, patient has no side effects from medication and LFTS are normal. No changes today.  She is taking atorvastatin 40 mg daily.  Lab Results  Component Value Date   ALT 15 10/03/2019   AST 16 10/03/2019   ALKPHOS 115 10/03/2019   BILITOT 0.4 10/03/2019   Lab Results  Component Value Date   CHOL 276 (H) 10/03/2019   HDL 52 10/03/2019   LDLCALC 194 (H) 10/03/2019   TRIG 163 (H) 10/03/2019   5. Bipolar affective disorder (HCC) She is on Risperdal, which is an obesogenic medication, but she is very stable, so will not recommend change.  6. Class 3 severe obesity with serious comorbidity and body mass index (BMI) of 40.0 to 44.9 in adult, unspecified obesity type Lauren Huffman)  Lauren Huffman is currently in the action stage of change. As such, Lauren Huffman goal is to continue with weight loss efforts.   Nutrition goals: She has agreed to keeping a food journal and adhering to recommended goals of 1200 calories and  95 grams of protein.   Exercise goals: Older adults should follow the adult guidelines. When older adults cannot meet the adult guidelines, they should be as physically active as their abilities and conditions will allow.  Older adults should do exercises that maintain or improve balance if they are at risk of falling.   Behavioral modification strategies: increasing lean protein intake, decreasing  simple carbohydrates, increasing vegetables and increasing water intake.  Lauren Huffman has agreed to follow-up with our clinic in 3 weeks. She was informed of the importance of frequent follow-up visits to maximize Lauren Huffman success with intensive lifestyle modifications for Lauren Huffman multiple health conditions.   Objective:   Blood pressure 134/87, pulse (!) 106, temperature 98.6 F (37 C), temperature source Oral, height 5\' 8"  (1.727 m), weight 271 lb (122.9 kg), SpO2 94 %. Body mass index is 41.21 kg/m.  General: Cooperative, alert, well developed, in no acute distress. HEENT: Conjunctivae and lids unremarkable. Cardiovascular: Regular rhythm.  Lungs: Normal work of breathing. Neurologic: No focal deficits.   Lab Results  Component Value Date   CREATININE 0.79 11/30/2019   BUN 20 11/30/2019   NA 141 11/30/2019   K 5.0 11/30/2019   CL 102 11/30/2019   CO2 26 11/30/2019   Lab Results  Component Value Date   ALT 15 10/03/2019   AST 16 10/03/2019   ALKPHOS 115 10/03/2019   BILITOT 0.4 10/03/2019   Lab Results  Component Value Date   HGBA1C 5.4 10/03/2019   Lab Results  Component Value Date   INSULIN 11.5 10/03/2019   Lab Results  Component Value Date   TSH 2.820 10/03/2019   Lab Results  Component Value Date   CHOL 276 (H) 10/03/2019   HDL 52 10/03/2019   LDLCALC 194 (H) 10/03/2019   TRIG 163 (H) 10/03/2019   Lab Results  Component Value Date   WBC 8.7 10/03/2019   HGB 15.9 10/03/2019   HCT 45.7 10/03/2019   MCV 89 10/03/2019   PLT 248 10/03/2019   Lab Results  Component Value Date   IRON 81 10/03/2019   TIBC 316 10/03/2019   FERRITIN 85 10/03/2019   Obesity Behavioral Intervention:   Approximately 15 minutes were spent on the discussion below.  ASK: We discussed the diagnosis of obesity with Lauren Huffman today and Lauren Huffman agreed to give Feb permission to discuss obesity behavioral modification therapy today.  ASSESS: Lauren Huffman has the diagnosis of obesity and Lauren Huffman BMI today is 41.2.  Lauren Huffman is in the action stage of change.   ADVISE: Lauren Huffman was educated on the multiple health risks of obesity as well as the benefit of weight loss to improve Lauren Huffman health. She was advised of the need for long term treatment and the importance of lifestyle modifications to improve Lauren Huffman current health and to decrease Lauren Huffman risk of future health problems.  AGREE: Multiple dietary modification options and treatment options were discussed and Lauren Huffman agreed to follow the recommendations documented in the above note.  ARRANGE: Lauren Huffman was educated on the importance of frequent visits to treat obesity as outlined per CMS and USPSTF guidelines and agreed to schedule Lauren Huffman next follow up appointment today.  Attestation Statements:   Reviewed by clinician on day of visit: allergies, medications, problem list, medical history, surgical history, family history, social history, and previous encounter notes.  I, Feb, CMA, am acting as transcriptionist for Insurance claims handler, DO  I have reviewed the above documentation for accuracy and completeness, and I agree with the above. Helane Rima  Juleen China, DO

## 2020-02-01 ENCOUNTER — Ambulatory Visit: Payer: Medicare Other | Admitting: Physician Assistant

## 2020-02-12 ENCOUNTER — Other Ambulatory Visit: Payer: Self-pay

## 2020-02-12 ENCOUNTER — Encounter (INDEPENDENT_AMBULATORY_CARE_PROVIDER_SITE_OTHER): Payer: Self-pay | Admitting: Family Medicine

## 2020-02-12 ENCOUNTER — Ambulatory Visit (INDEPENDENT_AMBULATORY_CARE_PROVIDER_SITE_OTHER): Payer: Medicare Other | Admitting: Family Medicine

## 2020-02-12 VITALS — BP 138/85 | HR 101 | Temp 98.1°F | Ht 68.0 in | Wt 270.0 lb

## 2020-02-12 DIAGNOSIS — Z6841 Body Mass Index (BMI) 40.0 and over, adult: Secondary | ICD-10-CM

## 2020-02-12 DIAGNOSIS — E559 Vitamin D deficiency, unspecified: Secondary | ICD-10-CM | POA: Diagnosis not present

## 2020-02-12 DIAGNOSIS — F319 Bipolar disorder, unspecified: Secondary | ICD-10-CM

## 2020-02-12 DIAGNOSIS — E782 Mixed hyperlipidemia: Secondary | ICD-10-CM

## 2020-02-12 DIAGNOSIS — I10 Essential (primary) hypertension: Secondary | ICD-10-CM | POA: Diagnosis not present

## 2020-02-13 NOTE — Progress Notes (Signed)
Chief Complaint:   OBESITY Lauren Huffman is here to discuss her progress with her obesity treatment plan along with follow-up of her obesity related diagnoses.   Today's visit was #: 7 Starting weight: 268 lbs Starting date: 10/03/2019 Today's weight: 270 lbs Today's date: 02/12/2020 Total lbs lost to date: +2 lbs Body mass index is 41.05 kg/m.   Interim History: Lauren Huffman says she has added a salad to every dinner.  She is having normal BMs.  She has increased her water intake.  She says she will be going for a PT evaluation next week.  She is hoping to restart aquatherapy 2-3 days per week at the downtown [facility] after Thanksgiving.    Nutrition Plan: the Category 2 Plan 65% of the time.  Activity: Walking 1-2 times per week.  Assessment/Plan:   1. Essential hypertension At goal. Medications: Imdur. Plan: Avoid buying foods that are: processed, frozen, or prepackaged to avoid excess salt. We will continue to monitor symptoms as they relate to her weight loss journey.  BP Readings from Last 3 Encounters:  02/12/20 138/85  01/23/20 134/87  01/09/20 (!) 137/94   Lab Results  Component Value Date   CREATININE 0.79 11/30/2019   2. Vitamin D deficiency Not optimized. Current vitamin D is 35.5, tested on 10/03/2019. Optimal goal > 50 ng/dL.   Plan:  [x]   Continue Vitamin D @50 ,000 IU every week. []   Continue home supplement daily. [x]   Follow-up for routine testing of Vitamin D at least 2-3 times per year to avoid over-replacement.  3. Mixed hyperlipidemia Lipid-lowering medications: Lipitor.   Plan: Dietary changes: Increase soluble fiber. Decrease simple carbohydrates. Exercise changes: An average 40 minutes of moderate to vigorous-intensity aerobic activity 3 or 4 times per week. Labs with PCP.  Lab Results  Component Value Date   CHOL 276 (H) 10/03/2019   HDL 52 10/03/2019   LDLCALC 194 (H) 10/03/2019   TRIG 163 (H) 10/03/2019   Lab Results  Component Value Date   ALT 15  10/03/2019   AST 16 10/03/2019   ALKPHOS 115 10/03/2019   BILITOT 0.4 10/03/2019   The 10-year ASCVD risk score 10/05/2019 DC Jr., et al., 2013) is: 11.4%   Values used to calculate the score:     Age: 26 years     Sex: Female     Is Non-Hispanic African American: No     Diabetic: No     Tobacco smoker: No     Systolic Blood Pressure: 138 mmHg     Is BP treated: Yes     HDL Cholesterol: 52 mg/dL     Total Cholesterol: 276 mg/dL  4. Bipolar affective disorder, remission status unspecified (HCC) She is taking Wellbutrin, Depakote, and Risperdal. Followed by Psychiatry. Stable.   5. Class 3 severe obesity with serious comorbidity and body mass index (BMI) of 40.0 to 44.9 in adult, unspecified obesity type [facility])  Course: Lauren Huffman is currently in the action stage of change. As such, her goal is to continue with weight loss efforts.   Nutrition goals: She has agreed to the Category 2 Plan.   Exercise goals: Silver Sneakers.  Behavioral modification strategies: increasing lean protein intake, decreasing simple carbohydrates, increasing vegetables and increasing water intake.  Lauren Huffman has agreed to follow-up with our clinic in 3-4 weeks. She was informed of the importance of frequent follow-up visits to maximize her success with intensive lifestyle modifications for her multiple health conditions.   Objective:   Blood pressure 138/85,  pulse (!) 101, temperature 98.1 F (36.7 C), temperature source Oral, height 5\' 8"  (1.727 m), weight 270 lb (122.5 kg), SpO2 95 %. Body mass index is 41.05 kg/m.  General: Cooperative, alert, well developed, in no acute distress. HEENT: Conjunctivae and lids unremarkable. Cardiovascular: Regular rhythm.  Lungs: Normal work of breathing. Neurologic: No focal deficits.   Lab Results  Component Value Date   CREATININE 0.79 11/30/2019   BUN 20 11/30/2019   NA 141 11/30/2019   K 5.0 11/30/2019   CL 102 11/30/2019   CO2 26 11/30/2019   Lab Results  Component  Value Date   ALT 15 10/03/2019   AST 16 10/03/2019   ALKPHOS 115 10/03/2019   BILITOT 0.4 10/03/2019   Lab Results  Component Value Date   HGBA1C 5.4 10/03/2019   Lab Results  Component Value Date   INSULIN 11.5 10/03/2019   Lab Results  Component Value Date   TSH 2.820 10/03/2019   Lab Results  Component Value Date   CHOL 276 (H) 10/03/2019   HDL 52 10/03/2019   LDLCALC 194 (H) 10/03/2019   TRIG 163 (H) 10/03/2019   Lab Results  Component Value Date   WBC 8.7 10/03/2019   HGB 15.9 10/03/2019   HCT 45.7 10/03/2019   MCV 89 10/03/2019   PLT 248 10/03/2019   Lab Results  Component Value Date   IRON 81 10/03/2019   TIBC 316 10/03/2019   FERRITIN 85 10/03/2019   Obesity Behavioral Intervention:   Approximately 15 minutes were spent on the discussion below.  ASK: We discussed the diagnosis of obesity with Erie today and Emonnie agreed to give Feb permission to discuss obesity behavioral modification therapy today.  ASSESS: Lauren Huffman has the diagnosis of obesity and her BMI today is 41.1. Lauren Huffman is in the action stage of change.   ADVISE: [name] was educated on the multiple health risks of obesity as well as the benefit of weight loss to improve her health. She was advised of the need for long term treatment and the importance of lifestyle modifications to improve her current health and to decrease her risk of future health problems.  AGREE: Multiple dietary modification options and treatment options were discussed and Lauren Huffman agreed to follow the recommendations documented in the above note.  ARRANGE: Lauren Huffman was educated on the importance of frequent visits to treat obesity as outlined per CMS and USPSTF guidelines and agreed to schedule her next follow up appointment today.  Attestation Statements:   Reviewed by clinician on day of visit: allergies, medications, problem list, medical history, surgical history, family history, social history, and previous encounter notes.  I, Feb, CMA, am acting as transcriptionist for Comptroller, DO  I have reviewed the above documentation for accuracy and completeness, and I agree with the above. Helane Rima, DO

## 2020-02-14 ENCOUNTER — Other Ambulatory Visit: Payer: Medicare Other

## 2020-02-15 ENCOUNTER — Encounter (INDEPENDENT_AMBULATORY_CARE_PROVIDER_SITE_OTHER): Payer: Self-pay

## 2020-02-19 ENCOUNTER — Telehealth: Payer: Self-pay | Admitting: Cardiology

## 2020-02-19 ENCOUNTER — Other Ambulatory Visit: Payer: Medicare Other

## 2020-02-19 NOTE — Telephone Encounter (Signed)
Lab orders faxed to her PCP per request to be drawn at her 02/20/20 appt.   Fax number [phone]

## 2020-02-19 NOTE — Telephone Encounter (Signed)
Patient goes for her physical tomorrow with Dr. Corliss Blacker. She would like the lab orders that were for her appt today faxed to Dr. Darrell Jewel office so that she doesn't have to get lab work done 2 days in a row. Her appt is early tomorrow morning.   Fax number [phone]

## 2020-02-19 NOTE — Telephone Encounter (Signed)
Orders faxed.  Patient notified and made aware this lab work is fasting

## 2020-03-06 ENCOUNTER — Ambulatory Visit (INDEPENDENT_AMBULATORY_CARE_PROVIDER_SITE_OTHER): Payer: Medicare Other | Admitting: Family Medicine

## 2020-03-12 ENCOUNTER — Encounter (INDEPENDENT_AMBULATORY_CARE_PROVIDER_SITE_OTHER): Payer: Self-pay | Admitting: Family Medicine

## 2020-03-12 ENCOUNTER — Other Ambulatory Visit: Payer: Self-pay

## 2020-03-12 ENCOUNTER — Ambulatory Visit (INDEPENDENT_AMBULATORY_CARE_PROVIDER_SITE_OTHER): Payer: Medicare Other | Admitting: Family Medicine

## 2020-03-12 VITALS — BP 133/85 | HR 104 | Temp 98.3°F | Ht 68.0 in | Wt 269.0 lb

## 2020-03-12 DIAGNOSIS — I10 Essential (primary) hypertension: Secondary | ICD-10-CM

## 2020-03-12 DIAGNOSIS — E782 Mixed hyperlipidemia: Secondary | ICD-10-CM

## 2020-03-12 DIAGNOSIS — E559 Vitamin D deficiency, unspecified: Secondary | ICD-10-CM | POA: Diagnosis not present

## 2020-03-12 DIAGNOSIS — Z6841 Body Mass Index (BMI) 40.0 and over, adult: Secondary | ICD-10-CM

## 2020-03-13 MED ORDER — VITAMIN D (ERGOCALCIFEROL) 1.25 MG (50000 UNIT) PO CAPS: 50000.0000 [IU] | ORAL_CAPSULE | ORAL | 0 refills | Status: DC

## 2020-03-13 NOTE — Progress Notes (Signed)
Chief Complaint:   OBESITY Lauren Huffman is here to discuss her progress with her obesity treatment plan along with follow-up of her obesity related diagnoses.   Today's visit was #: 8 Starting weight: 268 lbs Starting date: 10/03/2019 Today's weight: 269 lbs Today's date: 03/12/2020 Total lbs lost to date: +1 lb Body mass index is 40.9 kg/m.   Interim History: Lauren Huffman says she has been enjoying the holidays with family.  She is trying to be mindful of the plan. Nutrition Plan: the Category 2 Plan for 60% of the time.  Activity: None at this time.  Assessment/Plan:   1. Vitamin D deficiency Not at goal. Current vitamin D is 35.5, tested on 10/03/2019. Optimal goal > 50 ng/dL.   Plan:  [x]   Continue Vitamin D @50 ,000 IU every week. []   Continue home supplement daily. [x]   Follow-up for routine testing of Vitamin D at least 2-3 times per year to avoid over-replacement.  - Refill Vitamin D, Ergocalciferol, (DRISDOL) 1.25 MG (50000 UNIT) CAPS capsule; Take 1 capsule (50,000 Units total) by mouth every 7 (seven) days.  Dispense: 4 capsule; Refill: 0  2. Essential hypertension At goal. Medications: Imdur 30 mg daily.   Plan: Avoid buying foods that are: processed, frozen, or prepackaged to avoid excess salt. We will continue to monitor symptoms as they relate to her weight loss journey.  BP Readings from Last 3 Encounters:  03/12/20 133/85  02/12/20 138/85  01/23/20 134/87   Lab Results  Component Value Date   CREATININE 0.79 11/30/2019   3. Mixed hyperlipidemia Lipid-lowering medications: Lipitor 40 mg daily.   Plan: Dietary changes: Increase soluble fiber. Decrease simple carbohydrates. Exercise changes: An average 40 minutes of moderate to vigorous-intensity aerobic activity 3 or 4 times per week.   Lab Results  Component Value Date   CHOL 276 (H) 10/03/2019   HDL 52 10/03/2019   LDLCALC 194 (H) 10/03/2019   TRIG 163 (H) 10/03/2019   Lab Results  Component Value Date    ALT 15 10/03/2019   AST 16 10/03/2019   ALKPHOS 115 10/03/2019   BILITOT 0.4 10/03/2019   The 10-year ASCVD risk score 10/05/2019 DC Jr., et al., 2013) is: 10.6%   Values used to calculate the score:     Age: 64 years     Sex: Female     Is Non-Hispanic African American: No     Diabetic: No     Tobacco smoker: No     Systolic Blood Pressure: 133 mmHg     Is BP treated: Yes     HDL Cholesterol: 52 mg/dL     Total Cholesterol: 276 mg/dL  4. Class 3 severe obesity with serious comorbidity and body mass index (BMI) of 40.0 to 44.9 in adult, unspecified obesity type Lauren Huffman)  Course: Lauren Huffman is currently in the action stage of change. As such, her goal is to continue with weight loss efforts.   Nutrition goals: She has agreed to practicing portion control and making smarter food choices, such as increasing vegetables and decreasing simple carbohydrates.   Exercise goals: PT twice weekly starting next week.  Behavioral modification strategies: increasing lean protein intake, decreasing simple carbohydrates, increasing vegetables, increasing water intake, dealing with family or coworker sabotage, travel eating strategies, holiday eating strategies  and celebration eating strategies.  Lauren Huffman has agreed to follow-up with our clinic in 4 weeks. She was informed of the importance of frequent follow-up visits to maximize her success with intensive lifestyle modifications for  her multiple health conditions.   Objective:   Blood pressure 133/85, pulse (!) 104, temperature 98.3 F (36.8 C), temperature source Oral, height 5\' 8"  (1.727 m), weight 269 lb (122 kg), SpO2 95 %. Body mass index is 40.9 kg/m.  General: Cooperative, alert, well developed, in no acute distress. HEENT: Conjunctivae and lids unremarkable. Cardiovascular: Regular rhythm.  Lungs: Normal work of breathing. Neurologic: No focal deficits.   Lab Results  Component Value Date   CREATININE 0.79 11/30/2019   BUN 20 11/30/2019   NA 141  11/30/2019   K 5.0 11/30/2019   CL 102 11/30/2019   CO2 26 11/30/2019   Lab Results  Component Value Date   ALT 15 10/03/2019   AST 16 10/03/2019   ALKPHOS 115 10/03/2019   BILITOT 0.4 10/03/2019   Lab Results  Component Value Date   HGBA1C 5.4 10/03/2019   Lab Results  Component Value Date   INSULIN 11.5 10/03/2019   Lab Results  Component Value Date   TSH 2.820 10/03/2019   Lab Results  Component Value Date   CHOL 276 (H) 10/03/2019   HDL 52 10/03/2019   LDLCALC 194 (H) 10/03/2019   TRIG 163 (H) 10/03/2019   Lab Results  Component Value Date   WBC 8.7 10/03/2019   HGB 15.9 10/03/2019   HCT 45.7 10/03/2019   MCV 89 10/03/2019   PLT 248 10/03/2019   Lab Results  Component Value Date   IRON 81 10/03/2019   TIBC 316 10/03/2019   FERRITIN 85 10/03/2019   Obesity Behavioral Intervention:   Approximately 15 minutes were spent on the discussion below.  ASK: We discussed the diagnosis of obesity with [name] today and [name] agreed to give Feb permission to discuss obesity behavioral modification therapy today.  ASSESS: [name] has the diagnosis of obesity and her BMI today is 41.0. [name] is in the action stage of change.   ADVISE: [name] was educated on the multiple health risks of obesity as well as the benefit of weight loss to improve her health. She was advised of the need for long term treatment and the importance of lifestyle modifications to improve her current health and to decrease her risk of future health problems.  AGREE: Multiple dietary modification options and treatment options were discussed and [name] agreed to follow the recommendations documented in the above note.  ARRANGE: [name] was educated on the importance of frequent visits to treat obesity as outlined per CMS and USPSTF guidelines and agreed to schedule her next follow up appointment today.  Attestation Statements:   Reviewed by clinician on day of visit: allergies, medications, problem list, medical  history, surgical history, family history, social history, and previous encounter notes.  I, Feb, CMA, am acting as transcriptionist for Insurance claims handler, DO  I have reviewed the above documentation for accuracy and completeness, and I agree with the above. Helane Rima, DO

## 2020-04-11 ENCOUNTER — Encounter (INDEPENDENT_AMBULATORY_CARE_PROVIDER_SITE_OTHER): Payer: Self-pay | Admitting: Family Medicine

## 2020-04-15 ENCOUNTER — Telehealth (INDEPENDENT_AMBULATORY_CARE_PROVIDER_SITE_OTHER): Payer: Medicare Other | Admitting: Family Medicine

## 2020-04-15 ENCOUNTER — Other Ambulatory Visit: Payer: Self-pay

## 2020-04-15 ENCOUNTER — Encounter (INDEPENDENT_AMBULATORY_CARE_PROVIDER_SITE_OTHER): Payer: Self-pay | Admitting: Family Medicine

## 2020-04-15 DIAGNOSIS — E559 Vitamin D deficiency, unspecified: Secondary | ICD-10-CM

## 2020-04-15 DIAGNOSIS — Z6841 Body Mass Index (BMI) 40.0 and over, adult: Secondary | ICD-10-CM

## 2020-04-15 DIAGNOSIS — E782 Mixed hyperlipidemia: Secondary | ICD-10-CM | POA: Diagnosis not present

## 2020-04-15 DIAGNOSIS — K219 Gastro-esophageal reflux disease without esophagitis: Secondary | ICD-10-CM

## 2020-04-16 MED ORDER — VITAMIN D (ERGOCALCIFEROL) 1.25 MG (50000 UNIT) PO CAPS: 50000.0000 [IU] | ORAL_CAPSULE | ORAL | 0 refills | Status: DC
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
## 2020-05-06 ENCOUNTER — Ambulatory Visit (INDEPENDENT_AMBULATORY_CARE_PROVIDER_SITE_OTHER): Payer: Medicare Other | Admitting: Family Medicine

## 2020-05-09 ENCOUNTER — Other Ambulatory Visit: Payer: Self-pay

## 2020-05-09 ENCOUNTER — Encounter (INDEPENDENT_AMBULATORY_CARE_PROVIDER_SITE_OTHER): Payer: Self-pay | Admitting: Family Medicine

## 2020-05-09 ENCOUNTER — Ambulatory Visit (INDEPENDENT_AMBULATORY_CARE_PROVIDER_SITE_OTHER): Payer: Medicare Other | Admitting: Family Medicine

## 2020-05-09 VITALS — BP 138/84 | HR 93 | Temp 98.0°F | Ht 68.0 in | Wt 269.0 lb

## 2020-05-09 DIAGNOSIS — E782 Mixed hyperlipidemia: Secondary | ICD-10-CM | POA: Diagnosis not present

## 2020-05-09 DIAGNOSIS — F3289 Other specified depressive episodes: Secondary | ICD-10-CM

## 2020-05-09 DIAGNOSIS — Z6841 Body Mass Index (BMI) 40.0 and over, adult: Secondary | ICD-10-CM

## 2020-05-09 DIAGNOSIS — F411 Generalized anxiety disorder: Secondary | ICD-10-CM

## 2020-05-14 DIAGNOSIS — H43393 Other vitreous opacities, bilateral: Secondary | ICD-10-CM | POA: Diagnosis not present

## 2020-05-14 NOTE — Progress Notes (Signed)
Chief Complaint:   OBESITY Lauren Huffman is here to discuss her progress with her obesity treatment plan along with follow-up of her obesity related diagnoses.   Today's visit was #: 10 Starting weight: 268 lbs Starting date: 10/03/2019 Today's weight: 269 lbs Today's date: 05/09/2020 Total lbs lost to date: +1 lb Body mass index is 40.9 kg/m.   Interim History: Lauren Huffman says she has increased her protein and vegetable intake.  She can call for PT at the end of February.  She has been overindulging in cookies. Plan:  Get rid of cookies and replace with fruit. Nutrition Plan: Category 2 Plan for 20-25% of the time. Activity: None at this time.  Assessment/Plan:   1. Mixed hyperlipidemia Course: Not at goal.. Lipid-lowering medications: Lipitor 40 mg daily.   Plan: Dietary changes: Increase soluble fiber, decrease simple carbohydrates, decrease saturated fat. Exercise changes: Moderate to vigorous-intensity aerobic activity 150 minutes per week or as tolerated. We will continue to monitor along with PCP/specialists as it pertains to her weight loss journey.  Lab Results  Component Value Date   CHOL 276 (H) 10/03/2019   HDL 52 10/03/2019   LDLCALC 194 (H) 10/03/2019   TRIG 163 (H) 10/03/2019   Lab Results  Component Value Date   ALT 15 10/03/2019   AST 16 10/03/2019   ALKPHOS 115 10/03/2019   BILITOT 0.4 10/03/2019   The 10-year ASCVD risk score Denman George DC Jr., et al., 2013) is: 11.4%   Values used to calculate the score:     Age: 65 years     Sex: Female     Is Non-Hispanic African American: No     Diabetic: No     Tobacco smoker: No     Systolic Blood Pressure: 138 mmHg     Is BP treated: Yes     HDL Cholesterol: 52 mg/dL     Total Cholesterol: 276 mg/dL  2. GAD (generalized anxiety disorder) Worsening.  Behavior modification techniques were discussed today to help Lauren Huffman deal with her anxiety.    Plan:  Continue medications and therapy.  Okay for her to have virtual  visits.  3. Other depression, with emotional eating Medication: Wellbutrin XL 300 mg daily. Behavior modification techniques were discussed today to help deal with emotional/non-hunger eating behaviors.  4. Class 3 severe obesity with serious comorbidity and body mass index (BMI) of 40.0 to 44.9 in adult, unspecified obesity type Lauren Huffman)  Course: Lauren Huffman is currently in the action stage of change. As such, her goal is to continue with weight loss efforts.   Nutrition goals: She has agreed to the Category 2 Plan.   Exercise goals: Older adults should follow the adult guidelines. When older adults cannot meet the adult guidelines, they should be as physically active as their abilities and conditions will allow.  Older adults should do exercises that maintain or improve balance if they are at risk of falling.   Behavioral modification strategies: better snacking choices.  Lauren Huffman has agreed to follow-up with our clinic in 3 weeks. She was informed of the importance of frequent follow-up visits to maximize her success with intensive lifestyle modifications for her multiple health conditions.   Objective:   Blood pressure 138/84, pulse 93, temperature 98 F (36.7 C), temperature source Oral, height 5\' 8"  (1.727 m), weight 269 lb (122 kg), SpO2 96 %. Body mass index is 40.9 kg/m.  General: Cooperative, alert, well developed, in no acute distress. HEENT: Conjunctivae and lids unremarkable. Cardiovascular: Regular rhythm.  Lungs: Normal work of breathing. Neurologic: No focal deficits.   Lab Results  Component Value Date   CREATININE 0.79 11/30/2019   BUN 20 11/30/2019   NA 141 11/30/2019   K 5.0 11/30/2019   CL 102 11/30/2019   CO2 26 11/30/2019   Lab Results  Component Value Date   ALT 15 10/03/2019   AST 16 10/03/2019   ALKPHOS 115 10/03/2019   BILITOT 0.4 10/03/2019   Lab Results  Component Value Date   HGBA1C 5.4 10/03/2019   Lab Results  Component Value Date   INSULIN 11.5  10/03/2019   Lab Results  Component Value Date   TSH 2.820 10/03/2019   Lab Results  Component Value Date   CHOL 276 (H) 10/03/2019   HDL 52 10/03/2019   LDLCALC 194 (H) 10/03/2019   TRIG 163 (H) 10/03/2019   Lab Results  Component Value Date   WBC 8.7 10/03/2019   HGB 15.9 10/03/2019   HCT 45.7 10/03/2019   MCV 89 10/03/2019   PLT 248 10/03/2019   Lab Results  Component Value Date   IRON 81 10/03/2019   TIBC 316 10/03/2019   FERRITIN 85 10/03/2019   Obesity Behavioral Intervention:   Approximately 15 minutes were spent on the discussion below.  ASK: We discussed the diagnosis of obesity with Lauren Huffman today and Alayha agreed to give [place] permission to discuss obesity behavioral modification therapy today.  ASSESS: Pranika has the diagnosis of obesity and her BMI today is 41.0. Ismerai is in the action stage of change.   ADVISE: Kameran was educated on the multiple health risks of obesity as well as the benefit of weight loss to improve her health. She was advised of the need for long term treatment and the importance of lifestyle modifications to improve her current health and to decrease her risk of future health problems.  AGREE: Multiple dietary modification options and treatment options were discussed and Annemarie agreed to follow the recommendations documented in the above note.  ARRANGE: Ayeza was educated on the importance of frequent visits to treat obesity as outlined per CMS and USPSTF guidelines and agreed to schedule her next follow up appointment today.  Attestation Statements:   Reviewed by clinician on day of visit: allergies, medications, problem list, medical history, surgical history, family history, social history, and previous encounter notes.  I, Insurance claims handler, CMA, am acting as transcriptionist for Helane Rima, DO  I have reviewed the above documentation for accuracy and completeness, and I agree with the above. Helane Rima, DO

## 2020-06-06 ENCOUNTER — Encounter (INDEPENDENT_AMBULATORY_CARE_PROVIDER_SITE_OTHER): Payer: Self-pay | Admitting: Family Medicine

## 2020-06-06 ENCOUNTER — Other Ambulatory Visit: Payer: Self-pay

## 2020-06-06 ENCOUNTER — Ambulatory Visit (INDEPENDENT_AMBULATORY_CARE_PROVIDER_SITE_OTHER): Payer: Medicare Other | Admitting: Family Medicine

## 2020-06-06 VITALS — BP 115/78 | HR 100 | Temp 98.4°F | Ht 68.0 in | Wt 271.0 lb

## 2020-06-06 DIAGNOSIS — Z6841 Body Mass Index (BMI) 40.0 and over, adult: Secondary | ICD-10-CM

## 2020-06-06 DIAGNOSIS — E782 Mixed hyperlipidemia: Secondary | ICD-10-CM

## 2020-06-06 DIAGNOSIS — K9289 Other specified diseases of the digestive system: Secondary | ICD-10-CM

## 2020-06-06 DIAGNOSIS — F419 Anxiety disorder, unspecified: Secondary | ICD-10-CM

## 2020-06-06 DIAGNOSIS — E559 Vitamin D deficiency, unspecified: Secondary | ICD-10-CM

## 2020-06-10 NOTE — Progress Notes (Signed)
Chief Complaint:   OBESITY Lauren Huffman is here to discuss her progress with her obesity treatment plan along with follow-up of her obesity related diagnoses.   Today's visit was #: 11 Starting weight: 268 lbs Starting date: 10/03/2019 Today's weight: 271 lbs Today's date: 06/06/2020 Total lbs lost to date: 0 Body mass index is 41.21 kg/m.   Interim History:  Lauren Huffman says she is eating salad 2 times per week.  Current Meal Plan: the Category 2 Plan for 30% of the time.  Current Exercise Plan: None at this time.  Assessment/Plan:   1. Mixed hyperlipidemia Course: Not at goal. Lipid-lowering medications: Lipitor 40 mg daily.   Plan: Dietary changes: Increase soluble fiber, decrease simple carbohydrates, decrease saturated fat. Exercise changes: Moderate to vigorous-intensity aerobic activity 150 minutes per week or as tolerated. We will continue to monitor along with PCP/specialists as it pertains to her weight loss journey.  Lab Results  Component Value Date   CHOL 276 (H) 10/03/2019   HDL 52 10/03/2019   LDLCALC 194 (H) 10/03/2019   TRIG 163 (H) 10/03/2019   Lab Results  Component Value Date   ALT 15 10/03/2019   AST 16 10/03/2019   ALKPHOS 115 10/03/2019   BILITOT 0.4 10/03/2019   The 10-year ASCVD risk score Lauren Huffman., et al., 2013) is: 8.7%   Values used to calculate the score:     Age: 65 years     Sex: Female     Is Non-Hispanic African American: No     Diabetic: No     Tobacco smoker: No     Systolic Blood Pressure: 115 mmHg     Is BP treated: Yes     HDL Cholesterol: 52 mg/dL     Total Cholesterol: 276 mg/dL  2. Vitamin D deficiency Not at goal. Current vitamin D is 35.5, tested on 10/03/2019. Optimal goal > 50 ng/dL.   Plan: Continue to take prescription Vitamin D @50 ,000 IU every week as prescribed.  Follow-up for routine testing of Vitamin D, at least 2-3 times per year to avoid over-replacement.  3. Gas bloat syndrome, with loose stools Discussed  probiotic, Gas-X, eliminating dairy/low FODMAP, okay Imodium daily.  She has previously used Colestid but it is very expensive and she was not clear if it was helpful.  We also reviewed pelvic floor dysfunction treatment to help symptoms.  4. Anxiety, with emotional eating Lauren Huffman says her house repairs are progressing.  She drove to [place] last week to have lunch with her sister.  Plan:  Behavior modification techniques were discussed today to help Lauren Huffman deal with her anxiety.  Orders and follow up as documented in patient record.   5. Class 3 severe obesity with serious comorbidity and body mass index (BMI) of 40.0 to 44.9 in adult, unspecified obesity type Lauren Huffman)  Course: Lauren Huffman is currently in the action stage of change. As such, her goal is to continue with weight loss efforts.   Nutrition goals: She has Huffman to the Category 2 Plan.   Exercise goals: Older adults should follow the adult guidelines. When older adults cannot meet the adult guidelines, they should be as physically active as their abilities and conditions will allow.  Older adults should do exercises that maintain or improve balance if they are at risk of falling.   Behavioral modification strategies: increasing lean protein intake, decreasing simple carbohydrates, increasing vegetables, increasing water intake and decreasing liquid calories.  Lauren Huffman has Huffman to follow-up with our clinic  in 4 weeks. She was informed of the importance of frequent follow-up visits to maximize her success with intensive lifestyle modifications for her multiple health conditions.   Objective:   Blood pressure 115/78, pulse 100, temperature 98.4 F (36.9 C), temperature source Oral, height 5\' 8"  (1.727 m), weight 271 lb (122.9 kg), SpO2 94 %. Body mass index is 41.21 kg/m.  General: Cooperative, alert, well developed, in no acute distress. HEENT: Conjunctivae and lids unremarkable. Cardiovascular: Regular rhythm.  Lungs: Normal work of  breathing. Neurologic: No focal deficits.   Lab Results  Component Value Date   CREATININE 0.79 11/30/2019   BUN 20 11/30/2019   NA 141 11/30/2019   K 5.0 11/30/2019   CL 102 11/30/2019   CO2 26 11/30/2019   Lab Results  Component Value Date   ALT 15 10/03/2019   AST 16 10/03/2019   ALKPHOS 115 10/03/2019   BILITOT 0.4 10/03/2019   Lab Results  Component Value Date   HGBA1C 5.4 10/03/2019   Lab Results  Component Value Date   INSULIN 11.5 10/03/2019   Lab Results  Component Value Date   TSH 2.820 10/03/2019   Lab Results  Component Value Date   CHOL 276 (H) 10/03/2019   HDL 52 10/03/2019   LDLCALC 194 (H) 10/03/2019   TRIG 163 (H) 10/03/2019   Lab Results  Component Value Date   WBC 8.7 10/03/2019   HGB 15.9 10/03/2019   HCT 45.7 10/03/2019   MCV 89 10/03/2019   PLT 248 10/03/2019   Lab Results  Component Value Date   IRON 81 10/03/2019   TIBC 316 10/03/2019   FERRITIN 85 10/03/2019   Obesity Behavioral Intervention:   Approximately 15 minutes were spent on the discussion below.  ASK: We discussed the diagnosis of obesity with Lauren Huffman today and Lauren Huffman Huffman to give Lauren Huffman permission to discuss obesity behavioral modification therapy today.  ASSESS: Lauren Huffman has the diagnosis of obesity and her BMI today is 41.3. Lauren Huffman is in the action stage of change.   ADVISE: Lauren Huffman was educated on the multiple health risks of obesity as well as the benefit of weight loss to improve her health. She was advised of the need for long term treatment and the importance of lifestyle modifications to improve her current health and to decrease her risk of future health problems.  AGREE: Multiple dietary modification options and treatment options were discussed and Lauren Huffman to follow the recommendations documented in the above note.  ARRANGE: Lauren Huffman was educated on the importance of frequent visits to treat obesity as outlined per CMS and USPSTF guidelines and Huffman to schedule her next  follow up appointment today.  Attestation Statements:   Reviewed by clinician on day of visit: allergies, medications, problem list, medical history, surgical history, family history, social history, and previous encounter notes.  I, Lauren Huffman, CMA, am acting as transcriptionist for Insurance claims handler, DO  I have reviewed the above documentation for accuracy and completeness, and I agree with the above. Helane Rima, DO

## 2020-06-21 ENCOUNTER — Other Ambulatory Visit: Payer: Self-pay | Admitting: Family Medicine

## 2020-06-21 DIAGNOSIS — Z1231 Encounter for screening mammogram for malignant neoplasm of breast: Secondary | ICD-10-CM

## 2020-06-27 DIAGNOSIS — Z79899 Other long term (current) drug therapy: Secondary | ICD-10-CM | POA: Diagnosis not present

## 2020-06-27 DIAGNOSIS — E559 Vitamin D deficiency, unspecified: Secondary | ICD-10-CM | POA: Diagnosis not present

## 2020-06-27 DIAGNOSIS — I700 Atherosclerosis of aorta: Secondary | ICD-10-CM | POA: Diagnosis not present

## 2020-06-27 DIAGNOSIS — G4733 Obstructive sleep apnea (adult) (pediatric): Secondary | ICD-10-CM | POA: Diagnosis not present

## 2020-06-27 DIAGNOSIS — K219 Gastro-esophageal reflux disease without esophagitis: Secondary | ICD-10-CM | POA: Diagnosis not present

## 2020-06-27 DIAGNOSIS — K5730 Diverticulosis of large intestine without perforation or abscess without bleeding: Secondary | ICD-10-CM | POA: Diagnosis not present

## 2020-06-27 DIAGNOSIS — I10 Essential (primary) hypertension: Secondary | ICD-10-CM | POA: Diagnosis not present

## 2020-06-27 DIAGNOSIS — E785 Hyperlipidemia, unspecified: Secondary | ICD-10-CM | POA: Diagnosis not present

## 2020-06-27 LAB — CBC: RBC: 5.03 (ref 3.87–5.11)

## 2020-06-27 LAB — LIPID PANEL
Cholesterol: 139 (ref 0–200)
HDL: 44 (ref 35–70)
LDL Cholesterol: 71
Triglycerides: 133 (ref 40–160)

## 2020-06-27 LAB — CBC AND DIFFERENTIAL
HCT: 45 (ref 36–46)
Platelets: 221 (ref 150–399)
WBC: 7.2

## 2020-06-27 LAB — HEPATIC FUNCTION PANEL: ALT: 11 (ref 7–35)

## 2020-06-27 LAB — VITAMIN B12: Vitamin B-12: 757

## 2020-07-04 ENCOUNTER — Ambulatory Visit (INDEPENDENT_AMBULATORY_CARE_PROVIDER_SITE_OTHER): Payer: Medicare Other | Admitting: Family Medicine

## 2020-07-04 ENCOUNTER — Encounter (INDEPENDENT_AMBULATORY_CARE_PROVIDER_SITE_OTHER): Payer: Self-pay

## 2020-07-04 ENCOUNTER — Encounter (INDEPENDENT_AMBULATORY_CARE_PROVIDER_SITE_OTHER): Payer: Self-pay | Admitting: Family Medicine

## 2020-07-05 ENCOUNTER — Ambulatory Visit: Payer: Medicare Other

## 2020-07-11 ENCOUNTER — Encounter (INDEPENDENT_AMBULATORY_CARE_PROVIDER_SITE_OTHER): Payer: Self-pay | Admitting: Family Medicine

## 2020-07-11 DIAGNOSIS — E559 Vitamin D deficiency, unspecified: Secondary | ICD-10-CM

## 2020-07-15 MED ORDER — VITAMIN D (ERGOCALCIFEROL) 1.25 MG (50000 UNIT) PO CAPS: 50000.0000 [IU] | ORAL_CAPSULE | ORAL | 0 refills | Status: DC

## 2020-07-15 NOTE — Telephone Encounter (Signed)
Pt last seen by Dr. Wallace.  

## 2020-07-31 ENCOUNTER — Encounter (INDEPENDENT_AMBULATORY_CARE_PROVIDER_SITE_OTHER): Payer: Self-pay | Admitting: Family Medicine

## 2020-08-06 ENCOUNTER — Encounter (INDEPENDENT_AMBULATORY_CARE_PROVIDER_SITE_OTHER): Payer: Self-pay | Admitting: Family Medicine

## 2020-08-07 ENCOUNTER — Encounter (INDEPENDENT_AMBULATORY_CARE_PROVIDER_SITE_OTHER): Payer: Self-pay | Admitting: Family Medicine

## 2020-08-07 NOTE — Telephone Encounter (Signed)
Please address.  Thank you

## 2020-08-07 NOTE — Telephone Encounter (Signed)
Pt last seen by Dr. Wallace.  

## 2020-08-07 NOTE — Telephone Encounter (Signed)
Pt was addressed through her other mychart message

## 2020-08-12 ENCOUNTER — Ambulatory Visit (INDEPENDENT_AMBULATORY_CARE_PROVIDER_SITE_OTHER): Payer: Medicare Other | Admitting: Family Medicine

## 2020-08-14 ENCOUNTER — Other Ambulatory Visit: Payer: Self-pay

## 2020-08-14 ENCOUNTER — Ambulatory Visit (INDEPENDENT_AMBULATORY_CARE_PROVIDER_SITE_OTHER): Payer: Medicare Other | Admitting: Family Medicine

## 2020-08-14 ENCOUNTER — Encounter (INDEPENDENT_AMBULATORY_CARE_PROVIDER_SITE_OTHER): Payer: Self-pay | Admitting: Family Medicine

## 2020-08-14 VITALS — BP 111/76 | HR 96 | Temp 98.4°F | Ht 68.0 in | Wt 264.0 lb

## 2020-08-14 DIAGNOSIS — Z6841 Body Mass Index (BMI) 40.0 and over, adult: Secondary | ICD-10-CM

## 2020-08-14 DIAGNOSIS — R262 Difficulty in walking, not elsewhere classified: Secondary | ICD-10-CM

## 2020-08-14 DIAGNOSIS — F419 Anxiety disorder, unspecified: Secondary | ICD-10-CM

## 2020-08-14 DIAGNOSIS — I10 Essential (primary) hypertension: Secondary | ICD-10-CM | POA: Diagnosis not present

## 2020-08-14 NOTE — Progress Notes (Signed)
Chief Complaint:   OBESITY Lauren Huffman is here to discuss her progress with her obesity treatment plan along with follow-up of her obesity related diagnoses.   Today's visit was #: 12 Starting weight: 268 lbs Starting date: 10/03/2019 Today's weight: 264 lbs Today's date: 08/14/2020 Weight change since last visit: 7 lbs Total lbs lost to date: 4 lbs Body mass index is 40.14 kg/m.  Total weight loss percentage to date: -1.49%  Interim History:  Lauren Huffman will be starting next week with [facility].  She says she had a recurrence of anxiety and depression that was keeping her home.  She was able to work through it. Current Meal Plan: the Category 2 Plan for 25% of the time.  Current Exercise Plan: None.  Assessment/Plan:   1. Trouble walking Lauren Huffman will be starting with [facility] next week. We will continue to monitor symptoms as they relate to her weight loss journey.  2. Essential hypertension Not at goal. Medications: Imdur 30 mg daily.   Plan: Avoid buying foods that are: processed, frozen, or prepackaged to avoid excess salt. We will watch for signs of hypotension as she continues lifestyle modifications.  BP Readings from Last 3 Encounters:  08/14/20 111/76  06/06/20 115/78  05/09/20 138/84   Lab Results  Component Value Date   CREATININE 0.79 11/30/2019   3. Anxiety, with emotional eating Lauren Huffman takes Wellbutrin XL 300 mg daily.  Behavior modification techniques were discussed today to help Lauren Huffman deal with her anxiety.    4. Obesity, current BMI 40.1  Course: Lauren Huffman is currently in the action stage of change. As such, her goal is to continue with weight loss efforts.   Nutrition goals: She has agreed to the Category 2 Plan.   Exercise goals: PT.  Behavioral modification strategies: increasing lean protein intake, decreasing simple carbohydrates, increasing vegetables and increasing water intake.  Lauren Huffman has agreed to follow-up with our clinic in 3 weeks. She was  informed of the importance of frequent follow-up visits to maximize her success with intensive lifestyle modifications for her multiple health conditions.   Objective:   Blood pressure 111/76, pulse 96, temperature 98.4 F (36.9 C), height 5\' 8"  (1.727 m), weight 264 lb (119.7 kg), SpO2 95 %. Body mass index is 40.14 kg/m.  General: Cooperative, alert, well developed, in no acute distress. HEENT: Conjunctivae and lids unremarkable. Cardiovascular: Regular rhythm.  Lungs: Normal work of breathing. Neurologic: No focal deficits.   Lab Results  Component Value Date   CREATININE 0.79 11/30/2019   BUN 20 11/30/2019   NA 141 11/30/2019   K 5.0 11/30/2019   CL 102 11/30/2019   CO2 26 11/30/2019   Lab Results  Component Value Date   ALT 15 10/03/2019   AST 16 10/03/2019   ALKPHOS 115 10/03/2019   BILITOT 0.4 10/03/2019   Lab Results  Component Value Date   HGBA1C 5.4 10/03/2019   Lab Results  Component Value Date   INSULIN 11.5 10/03/2019   Lab Results  Component Value Date   TSH 2.820 10/03/2019   Lab Results  Component Value Date   CHOL 276 (H) 10/03/2019   HDL 52 10/03/2019   LDLCALC 194 (H) 10/03/2019   TRIG 163 (H) 10/03/2019   Lab Results  Component Value Date   WBC 8.7 10/03/2019   HGB 15.9 10/03/2019   HCT 45.7 10/03/2019   MCV 89 10/03/2019   PLT 248 10/03/2019   Lab Results  Component Value Date   IRON  81 10/03/2019   TIBC 316 10/03/2019   FERRITIN 85 10/03/2019   Obesity Behavioral Intervention:   Approximately 15 minutes were spent on the discussion below.  ASK: We discussed the diagnosis of obesity with Naiya today and Abigaile agreed to give [place] permission to discuss obesity behavioral modification therapy today.  ASSESS: Sharee has the diagnosis of obesity and her BMI today is 40.1. Yomayra is in the action stage of change.   ADVISE: Tanda was educated on the multiple health risks of obesity as well as the benefit of weight loss to improve her health.  She was advised of the need for long term treatment and the importance of lifestyle modifications to improve her current health and to decrease her risk of future health problems.  AGREE: Multiple dietary modification options and treatment options were discussed and Caidance agreed to follow the recommendations documented in the above note.  ARRANGE: Thandiwe was educated on the importance of frequent visits to treat obesity as outlined per CMS and USPSTF guidelines and agreed to schedule her next follow up appointment today.  Attestation Statements:   Reviewed by clinician on day of visit: allergies, medications, problem list, medical history, surgical history, family history, social history, and previous encounter notes.  I, Insurance claims handler, CMA, am acting as transcriptionist for Helane Rima, DO  I have reviewed the above documentation for accuracy and completeness, and I agree with the above. Helane Rima, DO

## 2020-08-15 ENCOUNTER — Encounter (INDEPENDENT_AMBULATORY_CARE_PROVIDER_SITE_OTHER): Payer: Self-pay

## 2020-09-04 DIAGNOSIS — R2681 Unsteadiness on feet: Secondary | ICD-10-CM | POA: Diagnosis not present

## 2020-09-04 DIAGNOSIS — R262 Difficulty in walking, not elsewhere classified: Secondary | ICD-10-CM | POA: Diagnosis not present

## 2020-09-04 DIAGNOSIS — R2689 Other abnormalities of gait and mobility: Secondary | ICD-10-CM | POA: Diagnosis not present

## 2020-09-17 ENCOUNTER — Encounter (INDEPENDENT_AMBULATORY_CARE_PROVIDER_SITE_OTHER): Payer: Self-pay

## 2020-09-17 ENCOUNTER — Ambulatory Visit (INDEPENDENT_AMBULATORY_CARE_PROVIDER_SITE_OTHER): Payer: Medicare Other | Admitting: Family Medicine

## 2020-09-25 ENCOUNTER — Ambulatory Visit: Payer: Medicare Other

## 2020-09-25 DIAGNOSIS — R2681 Unsteadiness on feet: Secondary | ICD-10-CM | POA: Diagnosis not present

## 2020-09-25 DIAGNOSIS — R2689 Other abnormalities of gait and mobility: Secondary | ICD-10-CM | POA: Diagnosis not present

## 2020-09-25 DIAGNOSIS — R262 Difficulty in walking, not elsewhere classified: Secondary | ICD-10-CM | POA: Diagnosis not present

## 2020-10-01 ENCOUNTER — Inpatient Hospital Stay: Admission: RE | Admit: 2020-10-01 | Payer: Medicare Other | Source: Ambulatory Visit

## 2020-10-17 ENCOUNTER — Ambulatory Visit: Payer: Medicare Other | Admitting: Cardiology

## 2020-10-18 ENCOUNTER — Ambulatory Visit: Payer: Medicare Other | Admitting: Cardiology

## 2020-10-22 ENCOUNTER — Ambulatory Visit (INDEPENDENT_AMBULATORY_CARE_PROVIDER_SITE_OTHER): Payer: Medicare Other | Admitting: Family Medicine

## 2020-10-22 ENCOUNTER — Encounter (INDEPENDENT_AMBULATORY_CARE_PROVIDER_SITE_OTHER): Payer: Self-pay | Admitting: Family Medicine

## 2020-10-22 ENCOUNTER — Other Ambulatory Visit: Payer: Self-pay

## 2020-10-22 VITALS — BP 112/74 | HR 66 | Temp 98.1°F | Ht 68.0 in | Wt 267.0 lb

## 2020-10-22 DIAGNOSIS — E782 Mixed hyperlipidemia: Secondary | ICD-10-CM

## 2020-10-22 DIAGNOSIS — Z6841 Body Mass Index (BMI) 40.0 and over, adult: Secondary | ICD-10-CM | POA: Diagnosis not present

## 2020-10-22 DIAGNOSIS — E66813 Obesity, class 3: Secondary | ICD-10-CM

## 2020-10-22 DIAGNOSIS — E559 Vitamin D deficiency, unspecified: Secondary | ICD-10-CM | POA: Diagnosis not present

## 2020-10-22 DIAGNOSIS — I10 Essential (primary) hypertension: Secondary | ICD-10-CM

## 2020-10-22 MED ORDER — VITAMIN D (ERGOCALCIFEROL) 1.25 MG (50000 UNIT) PO CAPS: 50000.0000 [IU] | ORAL_CAPSULE | ORAL | 0 refills | Status: DC

## 2020-10-23 DIAGNOSIS — R2681 Unsteadiness on feet: Secondary | ICD-10-CM | POA: Diagnosis not present

## 2020-10-23 DIAGNOSIS — R262 Difficulty in walking, not elsewhere classified: Secondary | ICD-10-CM | POA: Diagnosis not present

## 2020-10-23 DIAGNOSIS — R2689 Other abnormalities of gait and mobility: Secondary | ICD-10-CM | POA: Diagnosis not present

## 2020-10-24 ENCOUNTER — Encounter (INDEPENDENT_AMBULATORY_CARE_PROVIDER_SITE_OTHER): Payer: Self-pay

## 2020-10-28 DIAGNOSIS — R262 Difficulty in walking, not elsewhere classified: Secondary | ICD-10-CM | POA: Diagnosis not present

## 2020-10-28 DIAGNOSIS — R2681 Unsteadiness on feet: Secondary | ICD-10-CM | POA: Diagnosis not present

## 2020-10-28 DIAGNOSIS — R2689 Other abnormalities of gait and mobility: Secondary | ICD-10-CM | POA: Diagnosis not present

## 2020-11-01 NOTE — Progress Notes (Signed)
Chief Complaint:   OBESITY Lauren Huffman is here to discuss her progress with her obesity treatment plan along with follow-up of her obesity related diagnoses.   Today's visit was #: 13 Starting weight: 268 lbs Starting date: 10/03/2019 Today's weight: 267 lbs Today's date: 10/22/2020 Weight change since last visit: +3 lbs Total lbs lost to date: 1 lb Body mass index is 40.6 kg/m.  Total weight loss percentage to date: -0.37%  Interim History:  She says she has missed several appointments with [facility], but will try to get there this week.  Thinking about attending a new church.  She says she is trying not to use her cane.  She is having her house repaired.  Plan:  Labs at next visit.  Current Meal Plan: the Category 2 Plan for 40% of the time.  Current Exercise Plan: Walking for 20 minutes 2 times per week.  Assessment/Plan:   Meds ordered this encounter  Medications   Vitamin D, Ergocalciferol, (DRISDOL) 1.25 MG (50000 UNIT) CAPS capsule    Sig: Take 1 capsule (50,000 Units total) by mouth every 7 (seven) days.    Dispense:  12 capsule    Refill:  0   1. Vitamin D deficiency Not at goal.  She is taking vitamin D 50,000 IU weekly.  Plan: Continue to take prescription Vitamin D @50 ,000 IU every week as prescribed.  Follow-up for routine testing of Vitamin D, at least 2-3 times per year to avoid over-replacement.  Lab Results  Component Value Date   VD25OH 35.5 10/03/2019   - Vitamin D, Ergocalciferol, (DRISDOL) 1.25 MG (50000 UNIT) CAPS capsule; Take 1 capsule (50,000 Units total) by mouth every 7 (seven) days.  Dispense: 12 capsule; Refill: 0  2. Essential hypertension At goal. Medications: None.   Plan: Avoid buying foods that are: processed, frozen, or prepackaged to avoid excess salt. We will watch for signs of hypotension as she continues lifestyle modifications.  BP Readings from Last 3 Encounters:  10/22/20 112/74  08/14/20 111/76  06/06/20 115/78   Lab  Results  Component Value Date   CREATININE 0.79 11/30/2019   3. Mixed hyperlipidemia Course: At goal. Lipid-lowering medications: Lipitor 80 mg daily.   Plan: Dietary changes: Increase soluble fiber, decrease simple carbohydrates, decrease saturated fat. Exercise changes: Moderate to vigorous-intensity aerobic activity 150 minutes per week or as tolerated. We will continue to monitor along with PCP/specialists as it pertains to her weight loss journey.  Lab Results  Component Value Date   CHOL 139 06/27/2020   HDL 44 06/27/2020   LDLCALC 71 06/27/2020   TRIG 133 06/27/2020   Lab Results  Component Value Date   ALT 11 06/27/2020   AST 16 10/03/2019   ALKPHOS 115 10/03/2019   BILITOT 0.4 10/03/2019   The 10-year ASCVD risk score 10/05/2019 DC Jr., et al., 2013) is: 6.5%*   Values used to calculate the score:     Age: 65 years     Sex: Female     Is Non-Hispanic African American: No     Diabetic: No     Tobacco smoker: No     Systolic Blood Pressure: 112 mmHg     Is BP treated: Yes     HDL Cholesterol: 44 mg/dL*     Total Cholesterol: 139 mg/dL*     * - Cholesterol units were assumed for this score calculation  4. Obesity with current BMI of 40.7  Course: Lauren Huffman is currently in the action stage  of change. As such, her goal is to continue with weight loss efforts.   Nutrition goals: She has agreed to the Category 2 Plan.   Exercise goals:  PT.  Behavioral modification strategies: increasing lean protein intake, decreasing simple carbohydrates, increasing vegetables, increasing water intake, and decreasing liquid calories.  Lauren Huffman has agreed to follow-up with our clinic in 4 weeks. She was informed of the importance of frequent follow-up visits to maximize her success with intensive lifestyle modifications for her multiple health conditions.   Objective:   Blood pressure 112/74, pulse 66, temperature 98.1 F (36.7 C), temperature source Oral, height 5\' 8"  (1.727 m), weight 267 lb  (121.1 kg), SpO2 94 %. Body mass index is 40.6 kg/m.  General: Cooperative, alert, well developed, in no acute distress. HEENT: Conjunctivae and lids unremarkable. Cardiovascular: Regular rhythm.  Lungs: Normal work of breathing. Neurologic: No focal deficits.   Lab Results  Component Value Date   CREATININE 0.79 11/30/2019   BUN 20 11/30/2019   NA 141 11/30/2019   K 5.0 11/30/2019   CL 102 11/30/2019   CO2 26 11/30/2019   Lab Results  Component Value Date   ALT 11 06/27/2020   AST 16 10/03/2019   ALKPHOS 115 10/03/2019   BILITOT 0.4 10/03/2019   Lab Results  Component Value Date   HGBA1C 5.4 10/03/2019   Lab Results  Component Value Date   INSULIN 11.5 10/03/2019   Lab Results  Component Value Date   TSH 2.820 10/03/2019   Lab Results  Component Value Date   CHOL 139 06/27/2020   HDL 44 06/27/2020   LDLCALC 71 06/27/2020   TRIG 133 06/27/2020   Lab Results  Component Value Date   VD25OH 35.5 10/03/2019   Lab Results  Component Value Date   WBC 7.2 06/27/2020   HGB 15.9 10/03/2019   HCT 45 06/27/2020   MCV 89 10/03/2019   PLT 221 06/27/2020   Lab Results  Component Value Date   IRON 81 10/03/2019   TIBC 316 10/03/2019   FERRITIN 85 10/03/2019   Obesity Behavioral Intervention:   Approximately 15 minutes were spent on the discussion below.  ASK: We discussed the diagnosis of obesity with Lauren Huffman today and Lauren Huffman agreed to give Lauren Huffman permission to discuss obesity behavioral modification therapy today.  ASSESS: Lauren Huffman has the diagnosis of obesity and her BMI today is 40.7. Lauren Huffman is in the action stage of change.   ADVISE: Lauren Huffman was educated on the multiple health risks of obesity as well as the benefit of weight loss to improve her health. She was advised of the need for long term treatment and the importance of lifestyle modifications to improve her current health and to decrease her risk of future health problems.  AGREE: Multiple dietary modification  options and treatment options were discussed and Lauren Huffman agreed to follow the recommendations documented in the above note.  ARRANGE: Lauren Huffman was educated on the importance of frequent visits to treat obesity as outlined per CMS and USPSTF guidelines and agreed to schedule her next follow up appointment today.  Attestation Statements:   Reviewed by clinician on day of visit: allergies, medications, problem list, medical history, surgical history, family history, social history, and previous encounter notes.  I, Lauren Huffman, CMA, am acting as transcriptionist for Insurance claims handler, DO  I have reviewed the above documentation for accuracy and completeness, and I agree with the above. Helane Rima, DO

## 2020-11-19 ENCOUNTER — Encounter (INDEPENDENT_AMBULATORY_CARE_PROVIDER_SITE_OTHER): Payer: Self-pay | Admitting: Family Medicine

## 2020-11-20 ENCOUNTER — Ambulatory Visit (INDEPENDENT_AMBULATORY_CARE_PROVIDER_SITE_OTHER): Payer: Medicare Other | Admitting: Family Medicine

## 2020-11-22 ENCOUNTER — Other Ambulatory Visit: Payer: Self-pay

## 2020-11-22 ENCOUNTER — Ambulatory Visit
Admission: RE | Admit: 2020-11-22 | Discharge: 2020-11-22 | Disposition: A | Discharge status: Home / Self Care | Payer: Medicare Other | Source: Ambulatory Visit | Attending: Family Medicine | Admitting: Family Medicine

## 2020-11-22 DIAGNOSIS — Z1231 Encounter for screening mammogram for malignant neoplasm of breast: Secondary | ICD-10-CM | POA: Diagnosis not present

## 2020-11-25 DIAGNOSIS — R2681 Unsteadiness on feet: Secondary | ICD-10-CM | POA: Diagnosis not present

## 2020-11-25 DIAGNOSIS — R262 Difficulty in walking, not elsewhere classified: Secondary | ICD-10-CM | POA: Diagnosis not present

## 2020-11-25 DIAGNOSIS — R2689 Other abnormalities of gait and mobility: Secondary | ICD-10-CM | POA: Diagnosis not present

## 2020-11-27 DIAGNOSIS — R2681 Unsteadiness on feet: Secondary | ICD-10-CM | POA: Diagnosis not present

## 2020-11-27 DIAGNOSIS — R262 Difficulty in walking, not elsewhere classified: Secondary | ICD-10-CM | POA: Diagnosis not present

## 2020-11-27 DIAGNOSIS — R2689 Other abnormalities of gait and mobility: Secondary | ICD-10-CM | POA: Diagnosis not present

## 2020-12-02 DIAGNOSIS — R2689 Other abnormalities of gait and mobility: Secondary | ICD-10-CM | POA: Diagnosis not present

## 2020-12-02 DIAGNOSIS — R262 Difficulty in walking, not elsewhere classified: Secondary | ICD-10-CM | POA: Diagnosis not present

## 2020-12-02 DIAGNOSIS — R2681 Unsteadiness on feet: Secondary | ICD-10-CM | POA: Diagnosis not present

## 2020-12-18 ENCOUNTER — Encounter (INDEPENDENT_AMBULATORY_CARE_PROVIDER_SITE_OTHER): Payer: Self-pay | Admitting: Family Medicine

## 2020-12-18 ENCOUNTER — Other Ambulatory Visit: Payer: Self-pay

## 2020-12-18 ENCOUNTER — Ambulatory Visit (INDEPENDENT_AMBULATORY_CARE_PROVIDER_SITE_OTHER): Payer: Medicare Other | Admitting: Family Medicine

## 2020-12-18 VITALS — BP 123/78 | HR 109 | Temp 97.8°F | Ht 68.0 in | Wt 269.0 lb

## 2020-12-18 DIAGNOSIS — R262 Difficulty in walking, not elsewhere classified: Secondary | ICD-10-CM | POA: Diagnosis not present

## 2020-12-18 DIAGNOSIS — Z6841 Body Mass Index (BMI) 40.0 and over, adult: Secondary | ICD-10-CM | POA: Diagnosis not present

## 2020-12-18 DIAGNOSIS — E559 Vitamin D deficiency, unspecified: Secondary | ICD-10-CM

## 2020-12-18 DIAGNOSIS — F418 Other specified anxiety disorders: Secondary | ICD-10-CM

## 2020-12-18 MED ORDER — VITAMIN D (ERGOCALCIFEROL) 1.25 MG (50000 UNIT) PO CAPS: 50000.0000 [IU] | ORAL_CAPSULE | ORAL | 0 refills | Status: DC

## 2020-12-19 NOTE — Progress Notes (Signed)
Chief Complaint:   OBESITY Lauren Huffman is here to discuss her progress with her obesity treatment plan along with follow-up of her obesity related diagnoses.   Today's visit was #: 14 Starting weight: 268 lbs Starting date: 10/03/2019 Today's weight: 269 lbs Today's date: 12/18/2020 Weight change since last visit: +2 lbs Total lbs lost to date: +1 lb Body mass index is 40.9 kg/m.   Current Meal Plan: the Category 2 Plan for 20% of the time.  Current Exercise Plan: None.  Interim History:  Lauren Huffman says she is drinking half sweet/half unsweet tea.  She is still drinking water and zero sugar soda as well.  She says that her sleep is okay (8-10 hours per night).  She is seeing a therapist.  She will be going to [place] for a sister trip.  She is not currently exercising.  Assessment/Plan:   1. Trouble walking She will call [facility].  We discussed [facility].  She will MyChart if she wants to try.  2. Vitamin D deficiency Not optimized.  She is taking vitamin D 50,000 IU weekly.    Plan:  Will check labs at next visit.  Continue to take prescription Vitamin D @50 ,000 IU every week as prescribed.  Follow-up for routine testing of Vitamin D, at least 2-3 times per year to avoid over-replacement.  Lab Results  Component Value Date   VD25OH 35.5 10/03/2019   - Refill Vitamin D, Ergocalciferol, (DRISDOL) 1.25 MG (50000 UNIT) CAPS capsule; Take 1 capsule (50,000 Units total) by mouth every 7 (seven) days.  Dispense: 12 capsule; Refill: 0  3. Situational anxiety Still dealing with home and insurance issues.  4. Obesity with current BMI of 41  Course: Lauren Huffman is currently in the action stage of change. As such, her goal is to continue with weight loss efforts.   Nutrition goals: She has agreed to the Category 2 Plan.   Exercise goals:  Increase movement, chair exercises, PT.  Behavioral modification strategies: increasing lean protein intake, decreasing simple carbohydrates, and increasing  vegetables.  Lauren Huffman has agreed to follow-up with our clinic in 6 weeks. She was informed of the importance of frequent follow-up visits to maximize her success with intensive lifestyle modifications for her multiple health conditions.   Objective:   Blood pressure 123/78, pulse (!) 109, temperature 97.8 F (36.6 C), temperature source Oral, height 5\' 8"  (1.727 m), weight 269 lb (122 kg), SpO2 94 %. Body mass index is 40.9 kg/m.  General: Cooperative, alert, well developed, in no acute distress. HEENT: Conjunctivae and lids unremarkable. Cardiovascular: Regular rhythm.  Lungs: Normal work of breathing. Neurologic: No focal deficits.   Lab Results  Component Value Date   CREATININE 0.79 11/30/2019   BUN 20 11/30/2019   NA 141 11/30/2019   K 5.0 11/30/2019   CL 102 11/30/2019   CO2 26 11/30/2019   Lab Results  Component Value Date   ALT 11 06/27/2020   AST 16 10/03/2019   ALKPHOS 115 10/03/2019   BILITOT 0.4 10/03/2019   Lab Results  Component Value Date   HGBA1C 5.4 10/03/2019   Lab Results  Component Value Date   INSULIN 11.5 10/03/2019   Lab Results  Component Value Date   TSH 2.820 10/03/2019   Lab Results  Component Value Date   CHOL 139 06/27/2020   HDL 44 06/27/2020   LDLCALC 71 06/27/2020   TRIG 133 06/27/2020   Lab Results  Component Value Date   VD25OH 35.5 10/03/2019  Lab Results  Component Value Date   WBC 7.2 06/27/2020   HGB 15.9 10/03/2019   HCT 45 06/27/2020   MCV 89 10/03/2019   PLT 221 06/27/2020   Lab Results  Component Value Date   IRON 81 10/03/2019   TIBC 316 10/03/2019   FERRITIN 85 10/03/2019   Obesity Behavioral Intervention:   Approximately 15 minutes were spent on the discussion below.  ASK: We discussed the diagnosis of obesity with Lauren Huffman today and Lauren Huffman agreed to give [place] permission to discuss obesity behavioral modification therapy today.  ASSESS: Lauren Huffman has the diagnosis of obesity and her BMI today is 41.0. Lauren Huffman is in the  action stage of change.   ADVISE: Yasmen was educated on the multiple health risks of obesity as well as the benefit of weight loss to improve her health. She was advised of the need for long term treatment and the importance of lifestyle modifications to improve her current health and to decrease her risk of future health problems.  AGREE: Multiple dietary modification options and treatment options were discussed and Lauren Huffman agreed to follow the recommendations documented in the above note.  ARRANGE: Lauren Huffman was educated on the importance of frequent visits to treat obesity as outlined per CMS and USPSTF guidelines and agreed to schedule her next follow up appointment today.  Attestation Statements:   Reviewed by clinician on day of visit: allergies, medications, problem list, medical history, surgical history, family history, social history, and previous encounter notes.  I, Insurance claims handler, CMA, am acting as transcriptionist for Helane Rima, DO  I have reviewed the above documentation for accuracy and completeness, and I agree with the above. Helane Rima, DO

## 2021-01-21 ENCOUNTER — Encounter: Payer: Self-pay | Admitting: Cardiology

## 2021-01-21 ENCOUNTER — Other Ambulatory Visit: Payer: Self-pay

## 2021-01-21 ENCOUNTER — Ambulatory Visit: Payer: Medicare Other | Admitting: Cardiology

## 2021-01-21 VITALS — BP 134/88 | HR 86 | Ht 68.0 in | Wt 279.0 lb

## 2021-01-21 DIAGNOSIS — I471 Supraventricular tachycardia: Secondary | ICD-10-CM

## 2021-01-21 DIAGNOSIS — E785 Hyperlipidemia, unspecified: Secondary | ICD-10-CM | POA: Diagnosis not present

## 2021-01-21 DIAGNOSIS — I2510 Atherosclerotic heart disease of native coronary artery without angina pectoris: Secondary | ICD-10-CM | POA: Diagnosis not present

## 2021-01-21 DIAGNOSIS — Z6841 Body Mass Index (BMI) 40.0 and over, adult: Secondary | ICD-10-CM

## 2021-01-21 DIAGNOSIS — G4733 Obstructive sleep apnea (adult) (pediatric): Secondary | ICD-10-CM | POA: Diagnosis not present

## 2021-01-21 MED ORDER — ATORVASTATIN CALCIUM 80 MG PO TABS: 80.0000 mg | ORAL_TABLET | Freq: Every day | ORAL | 3 refills | Status: AC

## 2021-01-21 MED ORDER — ISOSORBIDE MONONITRATE ER 30 MG PO TB24
30.0000 mg | ORAL_TABLET | Freq: Every day | ORAL | 3 refills | Status: DC
Start: 2021-01-21 — End: 2022-02-06

## 2021-01-21 NOTE — Patient Instructions (Signed)

## 2021-01-21 NOTE — Progress Notes (Signed)
Date:  01/21/2021   ID:  Lauren Huffman, Lauren Huffman Mar 15, 1956, MRN [id number] The patient was identified using 2 identifiers. PCP:  Gweneth Dimitri, MD  Cardiologist:  Armanda Magic, MD  Electrophysiologist:  None   Evaluation Performed:  Follow-Up Visit  Chief Complaint: SVT, CAD, hyperlipidemia, hypertension  History of Present Illness:    Lauren Huffman is a 65 y.o. female with a  history of SVT,carpal tunnel, HTN, HLD, depression, anxiety, sleep apnea (unable to tolerate CPAP) & varicose veins s/p ablation. FH + for CAD.    She was in the ER October 2018 with symptomatic palpitations. Found to be in SVT - treated with Adenosine and converted - then started on CCB which she hasn't tolerated. She reported prior side effects with Metoprolol. She has not been able to tolerate CPAP. Echo was basically normal. She declined event monitor due to cost.    Lauren Carson, PA saw patient 11/14/2019 for dyspnea on exertion after she had canceled 3 appointments because of anxiety.  The echo 11/30/2019 normal LVEF 60 to 65% grade 1 DD.  Coronary CTA 12/04/2019 coronary calcium score 156 85th percentile for age and sex matched control moderate 50 to 69% proximal and greater than 70% mixed plaque in the LAD and diagonal.  Showed there is obstructive plaque in the mid diagonal branch but main epicardial arteries have normal flow and was started on aspirin 81 mg daily Imdur 30 mg daily and changed her to atorvastatin 40 mg daily.  She is here today for followup and is doing well.  She denies any chest pain or pressure, SOB, DOE, PND, orthopnea, LE edema, dizziness, palpitations or syncope. She  is compliant with her meds and is tolerating meds with no SE.    Past Medical History:  Diagnosis Date   Aneurysm, lower extremity (HCC)    Anxiety    Arthritis    Bipolar 2 disorder (HCC)    CAD (coronary artery disease)    a. by Cor CTA 11/2019.   Carpal tunnel syndrome    Right   Chronic pain    Depression    GERD  (gastroesophageal reflux disease)    Headache(784.0)    Hiatal hernia    History of colon polyps    History of iron deficiency    Hyperlipemia    Hypertension    Joint pain    Lactose intolerance    Left arm pain    Lower extremity edema    Memory difficulty    Mental disorder    Obesity    OSA (obstructive sleep apnea)    Osteoarthritis    Prediabetes    Pulmonary nodules    Sleep apnea    DOES NOT WEAR CPAP     Sleep disturbance    SOB (shortness of breath)    Status post ablation of incompetent vein using laser 2x in Nov. 2017   right   SVT (supraventricular tachycardia) (HCC)    TMJ (dislocation of temporomandibular joint)    Varicose veins    Vitamin D insufficiency    Past Surgical History:  Procedure Laterality Date   CYST REMOVAL HAND Left    JOINT REPLACEMENT Right    knee   KNEE ARTHROPLASTY  12/25/2011   Procedure: COMPUTER ASSISTED TOTAL KNEE ARTHROPLASTY;  Surgeon: [name], MD;  Location: [facility] OR;  Service: Orthopedics;  Laterality: Right;  Right total knee replacement, general anesthesia, femoral nerve block   KNEE ARTHROPLASTY Left 07/08/2012   Procedure:  COMPUTER ASSISTED TOTAL KNEE ARTHROPLASTY;  Surgeon: [name], MD;  Location: [facility] OR;  Service: Orthopedics;  Laterality: Left;  PRE OP FEMORAL NERVE BLOCK   lazer vein surgery  2017   right leg   TOTAL HIP ARTHROPLASTY Right 03/27/2016   TOTAL HIP ARTHROPLASTY Right 03/27/2016   Procedure: TOTAL HIP ARTHROPLASTY ANTERIOR APPROACH;  Surgeon: [name], MD;  Location: [facility] OR;  Service: Orthopedics;  Laterality: Right;   TOTAL KNEE ARTHROPLASTY Left 07/08/2012   [name]     Current Meds  Medication Sig   aspirin EC 81 MG tablet Take 1 tablet (81 mg total) by mouth daily. Swallow whole.   atorvastatin (LIPITOR) 80 MG tablet Take 80 mg by mouth daily.   buPROPion (WELLBUTRIN XL) 150 MG 24 hr tablet Take 300 mg by mouth daily.    Calcium-Phosphorus-Vitamin D (CALCIUM GUMMIES PO) Take 500 mg by  mouth.   Cyanocobalamin 3000 MCG CAPS Take 3,000 capsules by mouth every other day.   divalproex (DEPAKOTE ER) 500 MG 24 hr tablet 1/2 tab AM,- 1 tab PM   ELDERBERRY PO Take by mouth daily.   folic acid (FOLVITE) 1 MG tablet Take 2 mg by mouth daily.    isosorbide mononitrate (IMDUR) 30 MG 24 hr tablet Take 1 tablet (30 mg total) by mouth daily.   loratadine (CLARITIN) 10 MG tablet Take 10 mg by mouth daily.   Multiple Vitamins-Minerals (MULTIVITAMIN GUMMIES ADULT PO) Take 1 tablet by mouth daily.    omeprazole (PRILOSEC) 40 MG capsule Take 40 mg by mouth 2 (two) times daily.    Probiotic Product (ALIGN) CHEW Chew 1 tablet by mouth daily.   risperiDONE (RISPERDAL) 0.5 MG tablet Take 0.5 mg by mouth at bedtime.   TURMERIC PO Take 1,075 mg by mouth daily.   vitamin C (ASCORBIC ACID) 500 MG tablet Take 500 mg by mouth daily.   Vitamin D, Ergocalciferol, (DRISDOL) 1.25 MG (50000 UNIT) CAPS capsule Take 1 capsule (50,000 Units total) by mouth every 7 (seven) days.     Allergies:   Cartia xt [diltiazem hcl er beads], Gabapentin, Metoprolol, Sulfa antibiotics, and Trazodone   Social History   Tobacco Use   Smoking status: Never   Smokeless tobacco: Never  Vaping Use   Vaping Use: Never used  Substance Use Topics   Alcohol use: Not Currently   Drug use: No     Family Hx: The patient's family history includes CAD in her mother; Cancer in her father; Diabetes in her father; Heart disease in her father and mother; Hyperlipidemia in her mother and sister; Memory loss in her mother; Seizures in her mother; Stroke in her mother; Supraventricular tachycardia in her sister; Thyroid disease in her mother.  ROS:   Please see the history of present illness.     All other systems reviewed and are negative.   Prior CV studies:   The following studies were reviewed today: Coronary CTA and FFR 9/2021IMPRESSION: 1. Coronary calcium score of 156. This was 85th percentile for age and sex matched  control.   2. Normal coronary origin with right dominance.   3. Moderate (50-69%) proximal and severe (>70%) mixed plaque in the LAD and D1. CAD-RADS 4.   4.  Consider cardiac catheterization.   Chilton Si, MD     Electronically Signed   By: Chilton Si   On: 12/04/2019 17:39 FINDINGS: FFRct analysis was performed on the original cardiac CT angiogram dataset. Diagrammatic representation of the FFRct analysis is  provided in a separate PDF document in PACS. This dictation was created using the PDF document and an interactive 3D model of the results. 3D model is not available in the EMR/PACS. Normal FFR range is >0.80.   1. Left Main: No significant stenosis. LM FFR = 0.99.   2. LAD: No significant stenosis. Proximal FFR = 0.99, Mid FFR = 0.97, Distal FFR = 0.93.   3. LCX: No significant stenosis. Proximal FFR = 0.98, Mid FFR = 0.97, Distal FFR = 0.95   4. OM1: No significant stenosis. Proximal FFR = 0.98, Mid to distal FFR = 0.87.   5. RCA: No significant stenosis. Proximal FFR = 0.98, Mid FFR = 0.95, Distal FFR = 0.90.   IMPRESSION:: IMPRESSION: 1. Coronary CT FFR flow analysis demonstrates possible flow limiting lesion in the diagonal #1 branch (FFR 0.79 mid and 0.76 distal). Normal flow in RCA, LCX and LAD.   2.  Recommend medical management.   Tricha Ruggirello     Electronically Signed   By: Armanda Magic   On: 12/06/2019 12:46  2D echo 8/2021IMPRESSIONS     1. Left ventricular ejection fraction, by estimation, is 60 to 65%. The  left ventricle has normal function. The left ventricle has no regional  wall motion abnormalities. Left ventricular diastolic parameters are  consistent with Grade I diastolic  dysfunction (impaired relaxation). The average left ventricular global  longitudinal strain is -18.5 %. The global longitudinal strain is normal.   2. Right ventricular systolic function is normal. The right ventricular  size is normal. There is  normal pulmonary artery systolic pressure.   3. The mitral valve is normal in structure. No evidence of mitral valve  regurgitation. No evidence of mitral stenosis.   4. The aortic valve is normal in structure. Aortic valve regurgitation is  not visualized. No aortic stenosis is present.   5. The inferior vena cava is normal in size with greater than 50%  respiratory variability, suggesting right atrial pressure of 3 mmHg.   Comparison(s): 02/03/17 EF 55-60%.      Labs/Other Tests and Data Reviewed:    EKG:  No ECG reviewed.  Recent Labs: 06/27/2020: ALT 11; Platelets 221   Recent Lipid Panel Lab Results  Component Value Date/Time   CHOL 139 06/27/2020 12:00 AM   CHOL 276 (H) 10/03/2019 02:50 PM   TRIG 133 06/27/2020 12:00 AM   HDL 44 06/27/2020 12:00 AM   HDL 52 10/03/2019 02:50 PM   LDLCALC 71 06/27/2020 12:00 AM   LDLCALC 194 (H) 10/03/2019 02:50 PM    Wt Readings from Last 3 Encounters:  01/21/21 279 lb (126.6 kg)  12/18/20 269 lb (122 kg)  10/22/20 267 lb (121.1 kg)     Objective:    Vital Signs:  Ht 5\' 8"  (1.727 m)   Wt 279 lb (126.6 kg)   BMI 42.42 kg/m   GEN: Well nourished, well developed in no acute distress HEENT: Normal NECK: No JVD; No carotid bruits LYMPHATICS: No lymphadenopathy CARDIAC:RRR, no murmurs, rubs, gallops RESPIRATORY:  Clear to auscultation without rales, wheezing or rhonchi  ABDOMEN: Soft, non-tender, non-distended MUSCULOSKELETAL:  No edema; No deformity  SKIN: Warm and dry NEUROLOGIC:  Alert and oriented x 3 PSYCHIATRIC:  Normal affect    EKG was performed in the office today and demonstrates NSR with iRBBB  ASSESSMENT & PLAN:    ASCAD - Coronary CTA 12/04/2019 showed ronary calcium score 156 that was 85th percentile for age and sex matched control ,  moderate 50 to 69% proximal and > 70% mixed plaque in the LAD and diagonal.   -FFR c/w obstructive plaque in the mid diagonal branch but main epicardial arteries had normal  flow.with obstructive plaque in the mid diagonal branch on coronary CTA and FFR  -Medical management recommended  -She has not had any anginal chest pain  -Continue on aspirin, Imdur 30mg  daily and atorvastatin.    SVT -She was intolerant to metoprolol and Cardizem -She has not had any recent palpitations  History of OSA  -could not tolerate CPAP  Hyperlipidemia  -LDL goal less than 70  -I have personally reviewed and interpreted outside labs performed by patient's PCP which showed LDL 71, HDL 44, tags 133, ALT 11 in March  2022  -Continue prescription drug management with atorvastatin 80 mg daily> refilled  Obesity  -going to weight loss center.   -Recommend 150 minutes of exercise weekly.   Medication Adjustments/Labs and Tests Ordered: Current medicines are reviewed at length with the patient today.  Concerns regarding medicines are outlined above.   Tests Ordered: No orders of the defined types were placed in this encounter.   Medication Changes: No orders of the defined types were placed in this encounter.   Follow Up:  Virtual Visit  in 6 month(s) Dr. 11-19-1969  Signed, Mayford Knife, MD  01/21/2021 2:26 PM    [facility]

## 2021-01-21 NOTE — Addendum Note (Signed)
Addended by: Theresia Majors on: 01/21/2021 02:51 PM   Modules accepted: Orders

## 2021-01-29 ENCOUNTER — Encounter (INDEPENDENT_AMBULATORY_CARE_PROVIDER_SITE_OTHER): Payer: Self-pay | Admitting: Family Medicine

## 2021-01-29 ENCOUNTER — Ambulatory Visit (INDEPENDENT_AMBULATORY_CARE_PROVIDER_SITE_OTHER): Payer: Medicare Other | Admitting: Family Medicine

## 2021-01-29 ENCOUNTER — Other Ambulatory Visit: Payer: Self-pay

## 2021-01-29 VITALS — BP 135/88 | HR 91 | Temp 97.8°F | Ht 68.0 in | Wt 267.0 lb

## 2021-01-29 DIAGNOSIS — E559 Vitamin D deficiency, unspecified: Secondary | ICD-10-CM | POA: Diagnosis not present

## 2021-01-29 DIAGNOSIS — R5383 Other fatigue: Secondary | ICD-10-CM

## 2021-01-29 DIAGNOSIS — E782 Mixed hyperlipidemia: Secondary | ICD-10-CM | POA: Diagnosis not present

## 2021-01-29 DIAGNOSIS — E538 Deficiency of other specified B group vitamins: Secondary | ICD-10-CM | POA: Diagnosis not present

## 2021-01-29 DIAGNOSIS — R7303 Prediabetes: Secondary | ICD-10-CM | POA: Diagnosis not present

## 2021-01-29 DIAGNOSIS — I10 Essential (primary) hypertension: Secondary | ICD-10-CM

## 2021-01-29 DIAGNOSIS — R6889 Other general symptoms and signs: Secondary | ICD-10-CM | POA: Diagnosis not present

## 2021-01-29 DIAGNOSIS — Z6841 Body Mass Index (BMI) 40.0 and over, adult: Secondary | ICD-10-CM

## 2021-01-29 DIAGNOSIS — E66813 Obesity, class 3: Secondary | ICD-10-CM

## 2021-01-30 ENCOUNTER — Encounter (INDEPENDENT_AMBULATORY_CARE_PROVIDER_SITE_OTHER): Payer: Self-pay | Admitting: Family Medicine

## 2021-01-30 LAB — VITAMIN D 25 HYDROXY (VIT D DEFICIENCY, FRACTURES): Vit D, 25-Hydroxy: 54.5 ng/mL (ref 30.0–100.0)

## 2021-01-30 LAB — LIPID PANEL
Chol/HDL Ratio: 3.8 ratio (ref 0.0–4.4)
Cholesterol, Total: 164 mg/dL (ref 100–199)
HDL: 43 mg/dL (ref >39)
LDL Chol Calc (NIH): 99 mg/dL (ref 0–99)
Triglycerides: 125 mg/dL (ref 0–149)
VLDL Cholesterol Cal: 22 mg/dL (ref 5–40)

## 2021-01-30 LAB — COMPREHENSIVE METABOLIC PANEL
ALT: 15 IU/L (ref 0–32)
AST: 17 IU/L (ref 0–40)
Albumin/Globulin Ratio: 1.8 (ref 1.2–2.2)
Albumin: 4.4 g/dL (ref 3.8–4.8)
Alkaline Phosphatase: 116 IU/L (ref 44–121)
BUN/Creatinine Ratio: 22 (ref 12–28)
BUN: 16 mg/dL (ref 8–27)
Bilirubin Total: 0.5 mg/dL (ref 0.0–1.2)
CO2: 25 mmol/L (ref 20–29)
Calcium: 9.1 mg/dL (ref 8.7–10.3)
Chloride: 100 mmol/L (ref 96–106)
Creatinine, Ser: 0.72 mg/dL (ref 0.57–1.00)
Globulin, Total: 2.4 g/dL (ref 1.5–4.5)
Glucose: 96 mg/dL (ref 70–99)
Potassium: 4.4 mmol/L (ref 3.5–5.2)
Sodium: 142 mmol/L (ref 134–144)
Total Protein: 6.8 g/dL (ref 6.0–8.5)
eGFR: 92 mL/min/{1.73_m2} (ref >59)

## 2021-01-30 LAB — CBC WITH DIFFERENTIAL/PLATELET
Basophils Absolute: 0.1 10*3/uL (ref 0.0–0.2)
Basos: 1 %
EOS (ABSOLUTE): 0.2 10*3/uL (ref 0.0–0.4)
Eos: 3 %
Hematocrit: 44.2 % (ref 34.0–46.6)
Hemoglobin: 14.9 g/dL (ref 11.1–15.9)
Immature Grans (Abs): 0 10*3/uL (ref 0.0–0.1)
Immature Granulocytes: 1 %
Lymphocytes Absolute: 1.7 10*3/uL (ref 0.7–3.1)
Lymphs: 23 %
MCH: 29.7 pg (ref 26.6–33.0)
MCHC: 33.7 g/dL (ref 31.5–35.7)
MCV: 88 fL (ref 79–97)
Monocytes Absolute: 0.6 10*3/uL (ref 0.1–0.9)
Monocytes: 8 %
Neutrophils Absolute: 4.9 10*3/uL (ref 1.4–7.0)
Neutrophils: 64 %
Platelets: 273 10*3/uL (ref 150–450)
RBC: 5.02 x10E6/uL (ref 3.77–5.28)
RDW: 12.6 % (ref 11.7–15.4)
WBC: 7.4 10*3/uL (ref 3.4–10.8)

## 2021-01-30 LAB — TSH: TSH: 3.6 u[IU]/mL (ref 0.450–4.500)

## 2021-01-30 LAB — VITAMIN B12: Vitamin B-12: 1414 pg/mL — ABNORMAL HIGH (ref 232–1245)

## 2021-01-30 LAB — INSULIN, RANDOM: INSULIN: 15 u[IU]/mL (ref 2.6–24.9)

## 2021-01-30 LAB — HEMOGLOBIN A1C
Est. average glucose Bld gHb Est-mCnc: 120 mg/dL
Hgb A1c MFr Bld: 5.8 % — ABNORMAL HIGH (ref 4.8–5.6)

## 2021-02-03 NOTE — Progress Notes (Signed)
Chief Complaint:   OBESITY Lauren Huffman is here to discuss her progress with her obesity treatment plan along with follow-up of her obesity related diagnoses. See Medical Weight Management Flowsheet for complete bioelectrical impedance results.  Today's visit was #: 15 Starting weight: 268 lbs Starting date: 10/03/2019 Weight change since last visit: 2 lbs Total lbs lost to date: 1 lb Total weight loss percentage to date: -0.37%  Nutrition Plan: Category 2 Meal Plan for 25% of the time. Activity: None.  Interim History: Lauren Huffman provided the following food recall today: Breakfast:  1/2 - 3/4 cup [employer] Red Berry Cereal, Lactaid fat-free, English breakfast tea with Splenda - hot. 10 am - Silk yogurt - chocolate or strawberry.  Sometimes blueberries in cereal.  Lunch:  3 pieces salami, 4 slices cracker cut cheese, 5-6 cherry tomatoes, 10-15 Wheat Thins (tomato basil).  Sometimes apple. Dr. Marisue Ivan.  Dinner:  [employer] Zalad (3/4). Chinese (chicken/vegetables with rice, crab rangoon with sauce on it).  Homemade dinners:   Beef stew - creamy. Meat/rice/rice noodles/vegetable. Sausage/mashed potatoes/vegetable.  After dinner:  Sometimes Yasso.  No other snacks.  Assessment/Plan:   1. Vitamin D deficiency At goal.  She is taking vitamin D 50,000 IU weekly.  Plan: Continue to take prescription Vitamin D @50 ,000 IU every week as prescribed.  Will check vitamin D level today, as per below.  Lab Results  Component Value Date   VD25OH 54.5 01/29/2021   VD25OH 35.5 10/03/2019   - VITAMIN D 25 Hydroxy (Vit-D Deficiency, Fractures)  2. Mixed hyperlipidemia Course: Controlled. Lipid-lowering medications: Lipitor 80 mg daily.   Plan: Dietary changes: Increase soluble fiber, decrease simple carbohydrates, decrease saturated fat. Exercise changes: Moderate to vigorous-intensity aerobic activity 150 minutes per week or as tolerated. We will continue to monitor along with PCP/specialists  as it pertains to her weight loss journey.  Lab Results  Component Value Date   CHOL 164 01/29/2021   HDL 43 01/29/2021   LDLCALC 99 01/29/2021   TRIG 125 01/29/2021   CHOLHDL 3.8 01/29/2021   Lab Results  Component Value Date   ALT 15 01/29/2021   AST 17 01/29/2021   ALKPHOS 116 01/29/2021   BILITOT 0.5 01/29/2021   The 10-year ASCVD risk score (Arnett DK, et al., 2019) is: 10.1%   Values used to calculate the score:     Age: 65 years     Sex: Female     Is Non-Hispanic African American: No     Diabetic: No     Tobacco smoker: No     Systolic Blood Pressure: 135 mmHg     Is BP treated: Yes     HDL Cholesterol: 43 mg/dL     Total Cholesterol: 164 mg/dL  - Lipid panel  3. Essential hypertension At goal. Medications: Imdur 30 mg daily.   Plan: Avoid buying foods that are: processed, frozen, or prepackaged to avoid excess salt. We will watch for signs of hypotension as she continues lifestyle modifications.  BP Readings from Last 3 Encounters:  01/29/21 135/88  01/21/21 134/88  12/18/20 123/78   Lab Results  Component Value Date   CREATININE 0.72 01/29/2021   - Comprehensive metabolic panel  4. Prediabetes Improving, but not optimized. Goal is HgbA1c < 5.7.  Medication: None.    Plan: She will continue to focus on protein-rich, low simple carbohydrate foods. We reviewed the importance of hydration, regular exercise for stress reduction, and restorative sleep.  Will check A1c and insulin level  today.  Lab Results  Component Value Date   HGBA1C 5.8 (H) 01/29/2021   Lab Results  Component Value Date   INSULIN 15.0 01/29/2021   INSULIN 11.5 10/03/2019   - Hemoglobin A1c - Insulin, random  5. Other fatigue Check CBC w/diff and TSH today.  - CBC with Differential/Platelet - TSH  6. B12 deficiency Lab Results  Component Value Date   VITAMINB12 1,414 (H) 01/29/2021   Supplementation: OTC vitamin B12 3,000 mcg daily.   Plan:  Continue current  treatment.  Will check vitamin B12 level today.   - Vitamin B12  7. Obesity with current BMI of 40.6  Course: Lauren Huffman is currently in the action stage of change. As such, her goal is to continue with weight loss efforts.   Nutrition goals: She has agreed to the Category 2 Plan.   Exercise goals: Older adults should follow the adult guidelines. When older adults cannot meet the adult guidelines, they should be as physically active as their abilities and conditions will allow.  Older adults should do exercises that maintain or improve balance if they are at risk of falling.   Behavioral modification strategies: increasing lean protein intake, decreasing simple carbohydrates, increasing vegetables, increasing water intake, and decreasing liquid calories.  Lauren Huffman has agreed to follow-up with our clinic in 4 weeks. She was informed of the importance of frequent follow-up visits to maximize her success with intensive lifestyle modifications for her multiple health conditions.   Objective:   Blood pressure 135/88, pulse 91, temperature 97.8 F (36.6 C), temperature source Oral, height 5\' 8"  (1.727 m), weight 267 lb (121.1 kg), SpO2 94 %. Body mass index is 40.6 kg/m.  General: Cooperative, alert, well developed, in no acute distress. HEENT: Conjunctivae and lids unremarkable. Cardiovascular: Regular rhythm.  Lungs: Normal work of breathing. Neurologic: No focal deficits.   Lab Results  Component Value Date   CREATININE 0.72 01/29/2021   BUN 16 01/29/2021   NA 142 01/29/2021   K 4.4 01/29/2021   CL 100 01/29/2021   CO2 25 01/29/2021   Lab Results  Component Value Date   ALT 15 01/29/2021   AST 17 01/29/2021   ALKPHOS 116 01/29/2021   BILITOT 0.5 01/29/2021   Lab Results  Component Value Date   HGBA1C 5.8 (H) 01/29/2021   HGBA1C 5.4 10/03/2019   Lab Results  Component Value Date   INSULIN 15.0 01/29/2021   INSULIN 11.5 10/03/2019   Lab Results  Component Value Date   TSH  3.600 01/29/2021   Lab Results  Component Value Date   CHOL 164 01/29/2021   HDL 43 01/29/2021   LDLCALC 99 01/29/2021   TRIG 125 01/29/2021   CHOLHDL 3.8 01/29/2021   Lab Results  Component Value Date   VD25OH 54.5 01/29/2021   VD25OH 35.5 10/03/2019   Lab Results  Component Value Date   WBC 7.4 01/29/2021   HGB 14.9 01/29/2021   HCT 44.2 01/29/2021   MCV 88 01/29/2021   PLT 273 01/29/2021   Lab Results  Component Value Date   IRON 81 10/03/2019   TIBC 316 10/03/2019   FERRITIN 85 10/03/2019   Attestation Statements:   Reviewed by clinician on day of visit: allergies, medications, problem list, medical history, surgical history, family history, social history, and previous encounter notes.  Time spent on visit including pre-visit chart review and post-visit care and documentation was 46 minutes. Time was spent on: Food choices and timing of food intake reviewed today. I discussed  a personalized meal plan with the patient that will help her to lose weight and will improve her obesity-related conditions going forward. I performed a medically necessary appropriate examination and/or evaluation. I discussed the assessment and treatment plan with the patient. Motivational interviewing as well as evidence-based interventions for health behavior change were utilized today including the discussion of self monitoring techniques, problem-solving barriers and SMART goal setting techniques.  An exercise prescription was reviewed.  The patient was provided an opportunity to ask questions and all were answered. The patient agreed with the plan and demonstrated an understanding of the instructions. Clinical information was updated and documented in the EMR.  I, Insurance claims handler, CMA, am acting as transcriptionist for Helane Rima, DO  I have reviewed the above documentation for accuracy and completeness, and I agree with the above. -  Helane Rima, DO, MS, FAAFP, DABOM - Family and Bariatric  Medicine.

## 2021-02-03 NOTE — Telephone Encounter (Signed)
Dr.Wallace °

## 2021-02-04 ENCOUNTER — Other Ambulatory Visit (INDEPENDENT_AMBULATORY_CARE_PROVIDER_SITE_OTHER): Payer: Self-pay

## 2021-02-04 MED ORDER — CYANOCOBALAMIN 3000 MCG PO CAPS
3000.0000 | ORAL_CAPSULE | ORAL | Status: DC
Start: 2021-02-04 — End: 2023-12-11

## 2021-02-06 DIAGNOSIS — U071 COVID-19: Secondary | ICD-10-CM | POA: Diagnosis not present

## 2021-02-19 ENCOUNTER — Encounter (INDEPENDENT_AMBULATORY_CARE_PROVIDER_SITE_OTHER): Payer: Self-pay | Admitting: Family Medicine

## 2021-02-19 DIAGNOSIS — E559 Vitamin D deficiency, unspecified: Secondary | ICD-10-CM | POA: Diagnosis not present

## 2021-02-19 DIAGNOSIS — E785 Hyperlipidemia, unspecified: Secondary | ICD-10-CM | POA: Diagnosis not present

## 2021-02-19 DIAGNOSIS — Z79899 Other long term (current) drug therapy: Secondary | ICD-10-CM | POA: Diagnosis not present

## 2021-02-25 ENCOUNTER — Other Ambulatory Visit: Payer: Self-pay

## 2021-02-25 ENCOUNTER — Ambulatory Visit (INDEPENDENT_AMBULATORY_CARE_PROVIDER_SITE_OTHER): Payer: Medicare Other | Admitting: Family Medicine

## 2021-02-25 VITALS — BP 106/75 | HR 94 | Temp 97.4°F | Ht 68.0 in | Wt 266.0 lb

## 2021-02-25 DIAGNOSIS — F3289 Other specified depressive episodes: Secondary | ICD-10-CM | POA: Diagnosis not present

## 2021-02-25 DIAGNOSIS — Z6841 Body Mass Index (BMI) 40.0 and over, adult: Secondary | ICD-10-CM

## 2021-02-25 DIAGNOSIS — E559 Vitamin D deficiency, unspecified: Secondary | ICD-10-CM

## 2021-02-25 DIAGNOSIS — R7303 Prediabetes: Secondary | ICD-10-CM | POA: Diagnosis not present

## 2021-02-25 MED ORDER — VITAMIN D (ERGOCALCIFEROL) 1.25 MG (50000 UNIT) PO CAPS: 50000.0000 [IU] | ORAL_CAPSULE | ORAL | 0 refills | Status: DC

## 2021-02-25 MED ORDER — METFORMIN HCL ER (MOD) 500 MG PO TB24: 500.0000 mg | ORAL_TABLET | Freq: Every day | ORAL | 0 refills | Status: DC

## 2021-02-25 NOTE — Progress Notes (Signed)
Chief Complaint:   OBESITY Lauren Huffman is here to discuss her progress with her obesity treatment plan along with follow-up of her obesity related diagnoses. See Medical Weight Management Flowsheet for complete bioelectrical impedance results.  Today's visit was #: 16 Starting weight: 268 lbs Starting date: 10/03/2019 Weight change since last visit: 1 lb Total lbs lost to date: 2 lb Total weight loss percentage to date: -0.75%  Nutrition Plan: Category 2 Plan for 40% of the time.  Activity: None  Interim History: [name] says she has added vegetables every time she cooks.  She reports that she had COVID, but got through it well.  She recently saw Cardiology, and says the visit went well.  She will see Dr. Corliss Blacker in January.  Assessment/Plan:   1. Prediabetes Improving, but not optimized. Goal is HgbA1c < 5.7.  Medication: None.    Plan:  Start metformin 500 mg daily, as per below.  She will continue to focus on protein-rich, low simple carbohydrate foods. We reviewed the importance of hydration, regular exercise for stress reduction, and restorative sleep.   Lab Results  Component Value Date   HGBA1C 5.8 (H) 01/29/2021   Lab Results  Component Value Date   INSULIN 15.0 01/29/2021   INSULIN 11.5 10/03/2019   - Start metFORMIN (GLUMETZA) 500 MG (MOD) 24 hr tablet; Take 1 tablet (500 mg total) by mouth daily with breakfast.  Dispense: 30 tablet; Refill: 0  2. Vitamin D deficiency At goal. She is taking vitamin D 50,000 IU weekly.  Plan: Continue to take prescription Vitamin D @50 ,000 IU every week as prescribed.  Follow-up for routine testing of Vitamin D, at least 2-3 times per year to avoid over-replacement.  Lab Results  Component Value Date   VD25OH 54.5 01/29/2021   VD25OH 35.5 10/03/2019   - Refill Vitamin D, Ergocalciferol, (DRISDOL) 1.25 MG (50000 UNIT) CAPS capsule; Take 1 capsule (50,000 Units total) by mouth every 7 (seven) days.  Dispense: 12 capsule; Refill: 0  3.  Other depression, with emotional eating [name] is followed by Psychiatry.  She says she feels too flat and will discuss that today with her Psych NP.  4. Class 3 severe obesity with serious comorbidity and body mass index (BMI) of 40.0 to 44.9 in adult, unspecified obesity type [facility])  Course: [name] is currently in the action stage of change. As such, her goal is to continue with weight loss efforts.   Nutrition goals: She has agreed to the Category 2 Plan.   Exercise goals: All adults should avoid inactivity. Some physical activity is better than none, and adults who participate in any amount of physical activity gain some health benefits.  Behavioral modification strategies: increasing lean protein intake, decreasing simple carbohydrates, increasing vegetables, increasing water intake, and decreasing liquid calories.  Flonnie has agreed to follow-up with our clinic in 4 weeks. She was informed of the importance of frequent follow-up visits to maximize her success with intensive lifestyle modifications for her multiple health conditions.   Objective:   Blood pressure 106/75, pulse 94, temperature (!) 97.4 F (36.3 C), temperature source Oral, height 5\' 8"  (1.727 m), weight 266 lb (120.7 kg), SpO2 94 %. Body mass index is 40.45 kg/m.  General: Cooperative, alert, well developed, in no acute distress. HEENT: Conjunctivae and lids unremarkable. Cardiovascular: Regular rhythm.  Lungs: Normal work of breathing. Neurologic: No focal deficits.   Lab Results  Component Value Date   CREATININE 0.72 01/29/2021   BUN 16 01/29/2021  NA 142 01/29/2021   K 4.4 01/29/2021   CL 100 01/29/2021   CO2 25 01/29/2021   Lab Results  Component Value Date   ALT 15 01/29/2021   AST 17 01/29/2021   ALKPHOS 116 01/29/2021   BILITOT 0.5 01/29/2021   Lab Results  Component Value Date   HGBA1C 5.8 (H) 01/29/2021   HGBA1C 5.4 10/03/2019   Lab Results  Component Value Date   INSULIN 15.0 01/29/2021    INSULIN 11.5 10/03/2019   Lab Results  Component Value Date   TSH 3.600 01/29/2021   Lab Results  Component Value Date   CHOL 164 01/29/2021   HDL 43 01/29/2021   LDLCALC 99 01/29/2021   TRIG 125 01/29/2021   CHOLHDL 3.8 01/29/2021   Lab Results  Component Value Date   VD25OH 54.5 01/29/2021   VD25OH 35.5 10/03/2019   Lab Results  Component Value Date   WBC 7.4 01/29/2021   HGB 14.9 01/29/2021   HCT 44.2 01/29/2021   MCV 88 01/29/2021   PLT 273 01/29/2021   Lab Results  Component Value Date   IRON 81 10/03/2019   TIBC 316 10/03/2019   FERRITIN 85 10/03/2019   Attestation Statements:   Reviewed by clinician on day of visit: allergies, medications, problem list, medical history, surgical history, family history, social history, and previous encounter notes.  I, Insurance claims handler, CMA, am acting as transcriptionist for Helane Rima, DO  I have reviewed the above documentation for accuracy and completeness, and I agree with the above. -  Helane Rima, DO, MS, FAAFP, DABOM - Family and Bariatric Medicine.

## 2021-03-02 ENCOUNTER — Encounter (INDEPENDENT_AMBULATORY_CARE_PROVIDER_SITE_OTHER): Payer: Self-pay | Admitting: Family Medicine

## 2021-03-03 ENCOUNTER — Encounter (INDEPENDENT_AMBULATORY_CARE_PROVIDER_SITE_OTHER): Payer: Self-pay

## 2021-03-03 ENCOUNTER — Telehealth (INDEPENDENT_AMBULATORY_CARE_PROVIDER_SITE_OTHER): Payer: Self-pay | Admitting: Family Medicine

## 2021-03-03 ENCOUNTER — Encounter (INDEPENDENT_AMBULATORY_CARE_PROVIDER_SITE_OTHER): Payer: Self-pay | Admitting: Family Medicine

## 2021-03-03 DIAGNOSIS — R7303 Prediabetes: Secondary | ICD-10-CM

## 2021-03-03 NOTE — Telephone Encounter (Signed)
Prior authorization was started today for Metformin. We received prior authorization request while our office was closed for Thanksgiving.

## 2021-03-03 NOTE — Telephone Encounter (Signed)
Prior authorization for Metformin ER denied. Patient sent mychart message.

## 2021-03-05 MED ORDER — METFORMIN HCL ER 500 MG PO TB24: 500.0000 mg | ORAL_TABLET | Freq: Every day | ORAL | 0 refills | Status: DC

## 2021-03-11 DIAGNOSIS — R140 Abdominal distension (gaseous): Secondary | ICD-10-CM | POA: Diagnosis not present

## 2021-03-11 MED ORDER — METFORMIN HCL ER 500 MG PO TB24: 500.0000 mg | ORAL_TABLET | Freq: Every day | ORAL | 0 refills | Status: DC

## 2021-03-11 NOTE — Addendum Note (Signed)
Addended by: Scarlett Presto on: 03/11/2021 02:24 PM   Modules accepted: Orders

## 2021-03-12 ENCOUNTER — Emergency Department (HOSPITAL_COMMUNITY)
Admission: EM | Admit: 2021-03-12 | Discharge: 2021-03-12 | Disposition: A | Discharge status: Home / Self Care | Payer: Medicare Other | Attending: Medical | Admitting: Medical

## 2021-03-12 ENCOUNTER — Encounter (HOSPITAL_COMMUNITY): Payer: Self-pay

## 2021-03-12 ENCOUNTER — Emergency Department (HOSPITAL_COMMUNITY): Payer: Medicare Other

## 2021-03-12 DIAGNOSIS — Z5321 Procedure and treatment not carried out due to patient leaving prior to being seen by health care provider: Secondary | ICD-10-CM | POA: Insufficient documentation

## 2021-03-12 DIAGNOSIS — W01198A Fall on same level from slipping, tripping and stumbling with subsequent striking against other object, initial encounter: Secondary | ICD-10-CM | POA: Diagnosis not present

## 2021-03-12 DIAGNOSIS — S0990XA Unspecified injury of head, initial encounter: Secondary | ICD-10-CM | POA: Insufficient documentation

## 2021-03-12 MED ORDER — TETANUS-DIPHTH-ACELL PERTUSSIS 5-2.5-18.5 LF-MCG/0.5 IM SUSY: 0.5000 mL | PREFILLED_SYRINGE | Freq: Once | INTRAMUSCULAR | Status: DC

## 2021-03-12 NOTE — ED Provider Notes (Signed)
Emergency Medicine Provider Triage Evaluation Note  Lauren Huffman , a 65 y.o. female  was evaluated in triage.  Pt complains of mechanical fall that occurred earlier today.  Patient states that she was stepping onto a curb when she tripped and fell backwards striking her head on the concrete.  She denies loss of consciousness.  She does have a small amount of dried blood to the posterior aspect of her head.  She complains of a headache.  Daughter is at bedside and states she is acting at baseline.  She is unsure regarding tetanus status.  She is not anticoagulated.  Review of Systems  Positive: + head injury, wound Negative: - LOC  Physical Exam  BP (!) 150/85   Pulse 98   Temp 98.6 F (37 C) (Oral)   Resp 18   Ht 5\' 8"  (1.727 m)   Wt 120 kg   SpO2 97%   BMI 40.22 kg/m  Gen:   Awake, no distress   Resp:  Normal effort  MSK:   Moves extremities without difficulty  Other:  Dried blood noted to posterior aspect of head on left; unable to fully visualize if laceration is present. PERRL. EOMI. Normal finger to nose. Strength 5/5 to BUEs. Sensation intact throughout. No midline C spine TTP.   Medical Decision Making  Medically screening exam initiated at 8:21 PM.  Appropriate orders placed.  was informed that the remainder of the evaluation will be completed by another provider, this initial triage assessment does not replace that evaluation, and the importance of remaining in the ED until their evaluation is complete.     Lauren Falcon, PA-C 03/12/21 2022    2023, MD 03/12/21 2137

## 2021-03-12 NOTE — ED Triage Notes (Signed)
Pt arrives POV for eval of fall and striking head. Pt tripped getting up onto curb, fell backwards and struck head. No thinners, Denies LOC. Small abrasion w/ no visible lac in triage. Pt reports she just wanted to get checked out for possible concussion

## 2021-03-12 NOTE — ED Notes (Signed)
Pt decided to leave 

## 2021-03-17 ENCOUNTER — Telehealth (INDEPENDENT_AMBULATORY_CARE_PROVIDER_SITE_OTHER): Payer: Self-pay

## 2021-03-17 NOTE — Telephone Encounter (Signed)
Mychart msg addressed

## 2021-03-17 NOTE — Telephone Encounter (Signed)
Pt called in and stated that she sent a message on mc over the weekend. The pt is requesting a call back from the nurse. The pt stated that she started taken her metformin yesterday. The pt stated that it took a while for insurance to get her the Medicaine. The pt is wanting to know should she keep her appt or reschedule since this appt is for the follow up on the Medicaine.Please advise

## 2021-03-18 ENCOUNTER — Ambulatory Visit (INDEPENDENT_AMBULATORY_CARE_PROVIDER_SITE_OTHER): Payer: Medicare Other | Admitting: Family Medicine

## 2021-04-10 ENCOUNTER — Telehealth (INDEPENDENT_AMBULATORY_CARE_PROVIDER_SITE_OTHER): Payer: Medicare Other | Admitting: Family Medicine

## 2021-04-14 DIAGNOSIS — J019 Acute sinusitis, unspecified: Secondary | ICD-10-CM | POA: Diagnosis not present

## 2021-04-25 DIAGNOSIS — R197 Diarrhea, unspecified: Secondary | ICD-10-CM | POA: Diagnosis not present

## 2021-04-25 DIAGNOSIS — R058 Other specified cough: Secondary | ICD-10-CM | POA: Diagnosis not present

## 2021-04-25 DIAGNOSIS — Z23 Encounter for immunization: Secondary | ICD-10-CM | POA: Diagnosis not present

## 2021-04-25 DIAGNOSIS — E559 Vitamin D deficiency, unspecified: Secondary | ICD-10-CM | POA: Diagnosis not present

## 2021-04-25 DIAGNOSIS — E785 Hyperlipidemia, unspecified: Secondary | ICD-10-CM | POA: Diagnosis not present

## 2021-04-25 DIAGNOSIS — Z79899 Other long term (current) drug therapy: Secondary | ICD-10-CM | POA: Diagnosis not present

## 2021-04-25 DIAGNOSIS — K219 Gastro-esophageal reflux disease without esophagitis: Secondary | ICD-10-CM | POA: Diagnosis not present

## 2021-04-25 DIAGNOSIS — Z0000 Encounter for general adult medical examination without abnormal findings: Secondary | ICD-10-CM | POA: Diagnosis not present

## 2021-04-25 DIAGNOSIS — K5730 Diverticulosis of large intestine without perforation or abscess without bleeding: Secondary | ICD-10-CM | POA: Diagnosis not present

## 2021-04-30 ENCOUNTER — Ambulatory Visit (INDEPENDENT_AMBULATORY_CARE_PROVIDER_SITE_OTHER): Payer: Medicare Other | Admitting: Family Medicine

## 2021-05-01 ENCOUNTER — Ambulatory Visit (INDEPENDENT_AMBULATORY_CARE_PROVIDER_SITE_OTHER): Payer: Medicare Other | Admitting: Family Medicine

## 2021-05-13 ENCOUNTER — Ambulatory Visit (INDEPENDENT_AMBULATORY_CARE_PROVIDER_SITE_OTHER): Payer: Medicare Other | Admitting: Family Medicine

## 2021-05-13 ENCOUNTER — Ambulatory Visit (INDEPENDENT_AMBULATORY_CARE_PROVIDER_SITE_OTHER): Payer: Medicare Other | Admitting: Adult Health

## 2021-05-13 ENCOUNTER — Other Ambulatory Visit: Payer: Self-pay

## 2021-05-13 ENCOUNTER — Encounter (INDEPENDENT_AMBULATORY_CARE_PROVIDER_SITE_OTHER): Payer: Self-pay | Admitting: Adult Health

## 2021-05-13 ENCOUNTER — Other Ambulatory Visit (INDEPENDENT_AMBULATORY_CARE_PROVIDER_SITE_OTHER): Payer: Self-pay | Admitting: Adult Health

## 2021-05-13 VITALS — BP 136/80 | HR 80 | Temp 98.1°F | Ht 68.0 in | Wt 270.0 lb

## 2021-05-13 DIAGNOSIS — E668 Other obesity: Secondary | ICD-10-CM | POA: Diagnosis not present

## 2021-05-13 DIAGNOSIS — Z6841 Body Mass Index (BMI) 40.0 and over, adult: Secondary | ICD-10-CM | POA: Diagnosis not present

## 2021-05-13 DIAGNOSIS — R7303 Prediabetes: Secondary | ICD-10-CM

## 2021-05-13 MED ORDER — METFORMIN HCL ER 500 MG PO TB24: 500.0000 mg | ORAL_TABLET | Freq: Every day | ORAL | 0 refills | Status: DC

## 2021-05-13 NOTE — Progress Notes (Signed)
Chief Complaint:   OBESITY Lauren Huffman is here to discuss her progress with her obesity treatment plan along with follow-up of her obesity related diagnoses. Lauren Huffman is on the Category 2 Plan and states she is following her eating plan approximately 30% of the time. Lauren Huffman states she is not exercising regularly.  Today's visit was #: 17 Starting weight: 268 lbs Starting date: 10/03/2019 Today's weight: 270 lbs Today's date: 05/13/2021 Total lbs lost to date: 0 Total lbs lost since last in-office visit: 0  Interim History:  Lauren Huffman was started on metformin XR 500 mg daily on 02/25/2021. This is her first office visit since then.  Her birthday is on 05/16/2023- has been celebrating all week.  On 02/21/2018 - weight 190. COVID-19 infection - 02/2021. Fluctuating between 265-270 pounds.  Typical day of intake- Breakfast:  Grape Nuts with yogurt. Lunch:  Salami/cheese, Wheat Thins, cherry tomatoes. 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 Snack:  Yasso bar.  Subjective:   1. Prediabetes On 01/29/2021, CMP - GFR 92, A1c 5.8. Lauren Huffman was started on metformin XR 500 mg daily on 02/25/2021. She reports tolerating Metformin. She denies polyphagia.  Assessment/Plan:   1. Prediabetes Refill metformin XR 500 mg daily.  - Refill metFORMIN (GLUCOPHAGE-XR) 500 MG 24 hr tablet; Take 1 tablet (500 mg total) by mouth daily with breakfast.  Dispense: 30 tablet; Refill: 0  2. Class 3 severe obesity with serious comorbidity and body mass index (BMI) of 40.0 to 44.9 in adult, unspecified obesity type Lauren Huffman)  Lauren Huffman is currently in the action stage of change. As such, her goal is to continue with weight loss efforts. She has agreed to the Category 2 Plan.   Check fasting labs at next office visit.  Exercise goals:  As is.  Behavioral modification strategies: increasing lean protein intake, decreasing simple carbohydrates, meal planning and cooking strategies, keeping healthy foods in the home, and planning for  success.  Lauren Huffman has agreed to follow-up with our clinic in 3 weeks, fasting. She was informed of the importance of frequent follow-up visits to maximize her success with intensive lifestyle modifications for her multiple health conditions.   Objective:   Blood pressure 136/80, pulse 80, temperature 98.1 F (36.7 C), height 5\' 8"  (1.727 m), weight 270 lb (122.5 kg), SpO2 98 %. Body mass index is 41.05 kg/m.  General: Cooperative, alert, well developed, in no acute distress. HEENT: Conjunctivae and lids unremarkable. Cardiovascular: Regular rhythm.  Lungs: Normal work of breathing. Neurologic: No focal deficits.   Lab Results  Component Value Date   CREATININE 0.72 01/29/2021   BUN 16 01/29/2021   NA 142 01/29/2021   K 4.4 01/29/2021   CL 100 01/29/2021   CO2 25 01/29/2021   Lab Results  Component Value Date   ALT 15 01/29/2021   AST 17 01/29/2021   ALKPHOS 116 01/29/2021   BILITOT 0.5 01/29/2021   Lab Results  Component Value Date   HGBA1C 5.8 (H) 01/29/2021   HGBA1C 5.4 10/03/2019   Lab Results  Component Value Date   INSULIN 15.0 01/29/2021   INSULIN 11.5 10/03/2019   Lab Results  Component Value Date   TSH 3.600 01/29/2021   Lab Results  Component Value Date   CHOL 164 01/29/2021   HDL 43 01/29/2021   LDLCALC 99 01/29/2021   TRIG 125 01/29/2021   CHOLHDL 3.8 01/29/2021   Lab Results  Component Value Date   VD25OH 54.5 01/29/2021   VD25OH 35.5 10/03/2019   Lab Results  Component Value Date   WBC 7.4 01/29/2021   HGB 14.9 01/29/2021   HCT 44.2 01/29/2021   MCV 88 01/29/2021   PLT 273 01/29/2021   Lab Results  Component Value Date   IRON 81 10/03/2019   TIBC 316 10/03/2019   FERRITIN 85 10/03/2019   Obesity Behavioral Intervention:   Approximately 15 minutes were spent on the discussion below.  ASK: We discussed the diagnosis of obesity with Lauren Huffman today and Lauren Huffman agreed to give [place] permission to discuss obesity behavioral modification therapy  today.  ASSESS: Lauren Huffman has the diagnosis of obesity and her BMI today is 41.1. Lauren Huffman is in the action stage of change.   ADVISE: Lauren Huffman was educated on the multiple health risks of obesity as well as the benefit of weight loss to improve her health. She was advised of the need for long term treatment and the importance of lifestyle modifications to improve her current health and to decrease her risk of future health problems.  AGREE: Multiple dietary modification options and treatment options were discussed and Lauren Huffman agreed to follow the recommendations documented in the above note.  ARRANGE: Lauren Huffman was educated on the importance of frequent visits to treat obesity as outlined per CMS and USPSTF guidelines and agreed to schedule her next follow up appointment today.  Attestation Statements:   Reviewed by clinician on day of visit: allergies, medications, problem list, medical history, surgical history, family history, social history, and previous encounter notes.  I, Insurance claims handler, CMA, am acting as Energy manager for William Hamburger, NP.  I have reviewed the above documentation for accuracy and completeness, and I agree with the above. -  Lauren Hodes d. Orvil Faraone, NP-C

## 2021-06-06 ENCOUNTER — Encounter (INDEPENDENT_AMBULATORY_CARE_PROVIDER_SITE_OTHER): Payer: Self-pay | Admitting: Family Medicine

## 2021-06-11 ENCOUNTER — Encounter (INDEPENDENT_AMBULATORY_CARE_PROVIDER_SITE_OTHER): Payer: Self-pay | Admitting: Family Medicine

## 2021-06-16 ENCOUNTER — Other Ambulatory Visit: Payer: Self-pay

## 2021-06-16 ENCOUNTER — Ambulatory Visit (INDEPENDENT_AMBULATORY_CARE_PROVIDER_SITE_OTHER): Payer: Medicare Other | Admitting: Family Medicine

## 2021-06-16 ENCOUNTER — Encounter (INDEPENDENT_AMBULATORY_CARE_PROVIDER_SITE_OTHER): Payer: Self-pay | Admitting: Family Medicine

## 2021-06-16 VITALS — BP 148/87 | HR 88 | Temp 97.8°F | Ht 68.0 in | Wt 270.0 lb

## 2021-06-16 DIAGNOSIS — E559 Vitamin D deficiency, unspecified: Secondary | ICD-10-CM

## 2021-06-16 DIAGNOSIS — E782 Mixed hyperlipidemia: Secondary | ICD-10-CM | POA: Diagnosis not present

## 2021-06-16 DIAGNOSIS — R7303 Prediabetes: Secondary | ICD-10-CM

## 2021-06-16 DIAGNOSIS — I10 Essential (primary) hypertension: Secondary | ICD-10-CM

## 2021-06-16 DIAGNOSIS — Z6841 Body Mass Index (BMI) 40.0 and over, adult: Secondary | ICD-10-CM

## 2021-06-16 DIAGNOSIS — E669 Obesity, unspecified: Secondary | ICD-10-CM

## 2021-06-16 DIAGNOSIS — F3289 Other specified depressive episodes: Secondary | ICD-10-CM

## 2021-06-17 LAB — COMPREHENSIVE METABOLIC PANEL
ALT: 15 IU/L (ref 0–32)
AST: 20 IU/L (ref 0–40)
Albumin/Globulin Ratio: 1.8 (ref 1.2–2.2)
Albumin: 4.4 g/dL (ref 3.8–4.8)
Alkaline Phosphatase: 117 IU/L (ref 44–121)
BUN/Creatinine Ratio: 23 (ref 12–28)
BUN: 17 mg/dL (ref 8–27)
Bilirubin Total: 0.4 mg/dL (ref 0.0–1.2)
CO2: 25 mmol/L (ref 20–29)
Calcium: 9 mg/dL (ref 8.7–10.3)
Chloride: 101 mmol/L (ref 96–106)
Creatinine, Ser: 0.73 mg/dL (ref 0.57–1.00)
Globulin, Total: 2.4 g/dL (ref 1.5–4.5)
Glucose: 81 mg/dL (ref 70–99)
Potassium: 4.5 mmol/L (ref 3.5–5.2)
Sodium: 142 mmol/L (ref 134–144)
Total Protein: 6.8 g/dL (ref 6.0–8.5)
eGFR: 90 mL/min/{1.73_m2} (ref >59)

## 2021-06-17 LAB — VITAMIN D 25 HYDROXY (VIT D DEFICIENCY, FRACTURES): Vit D, 25-Hydroxy: 56.9 ng/mL (ref 30.0–100.0)

## 2021-06-17 LAB — CBC WITH DIFFERENTIAL/PLATELET
Basophils Absolute: 0.1 10*3/uL (ref 0.0–0.2)
Basos: 1 %
EOS (ABSOLUTE): 0.2 10*3/uL (ref 0.0–0.4)
Eos: 2 %
Hematocrit: 45.3 % (ref 34.0–46.6)
Hemoglobin: 15.4 g/dL (ref 11.1–15.9)
Immature Grans (Abs): 0 10*3/uL (ref 0.0–0.1)
Immature Granulocytes: 0 %
Lymphocytes Absolute: 2.1 10*3/uL (ref 0.7–3.1)
Lymphs: 26 %
MCH: 29.2 pg (ref 26.6–33.0)
MCHC: 34 g/dL (ref 31.5–35.7)
MCV: 86 fL (ref 79–97)
Monocytes Absolute: 0.7 10*3/uL (ref 0.1–0.9)
Monocytes: 9 %
Neutrophils Absolute: 4.9 10*3/uL (ref 1.4–7.0)
Neutrophils: 62 %
Platelets: 254 10*3/uL (ref 150–450)
RBC: 5.28 x10E6/uL (ref 3.77–5.28)
RDW: 13.8 % (ref 11.7–15.4)
WBC: 8 10*3/uL (ref 3.4–10.8)

## 2021-06-17 LAB — LIPID PANEL WITH LDL/HDL RATIO
Cholesterol, Total: 180 mg/dL (ref 100–199)
HDL: 53 mg/dL (ref >39)
LDL Chol Calc (NIH): 103 mg/dL — ABNORMAL HIGH (ref 0–99)
LDL/HDL Ratio: 1.9 ratio (ref 0.0–3.2)
Triglycerides: 138 mg/dL (ref 0–149)
VLDL Cholesterol Cal: 24 mg/dL (ref 5–40)

## 2021-06-17 LAB — HEMOGLOBIN A1C
Est. average glucose Bld gHb Est-mCnc: 117 mg/dL
Hgb A1c MFr Bld: 5.7 % — ABNORMAL HIGH (ref 4.8–5.6)

## 2021-06-17 LAB — TSH: TSH: 2.46 u[IU]/mL (ref 0.450–4.500)

## 2021-06-17 NOTE — Progress Notes (Signed)
Chief Complaint:   OBESITY Lauren Huffman is here to discuss her progress with her obesity treatment plan along with follow-up of her obesity related diagnoses. See Medical Weight Management Flowsheet for complete bioelectrical impedance results.  Today's visit was #: 18 Starting weight: 268 lbs Starting date: 10/03/2019 Weight change since last visit: 0 Total lbs lost to date: +2 lbs  Nutrition Plan: Category 2 Plan for 30% of the time.  Activity: None.  Interim History: Lauren Huffman endorses emotional eating.  She sees [name], NP, at the [facility].     Assessment/Plan:   1. Prediabetes Improving. Goal is HgbA1c < 5.7.  Medication: metformin XR 500 mg daily.    Plan:  Continue metformin XR 500 mg daily.  Will check labs today.  She will continue to focus on protein-rich, low simple carbohydrate foods. We reviewed the importance of hydration, regular exercise for stress reduction, and restorative sleep.   Lab Results  Component Value Date   INSULIN 15.0 01/29/2021   INSULIN 11.5 10/03/2019   - Comprehensive metabolic panel - Hemoglobin A1c  2. Vitamin D deficiency At goal. She is taking vitamin D 50,000 IU weekly.  Plan: Continue to take prescription Vitamin D @50 ,000 IU every week as prescribed.  Will check vitamin D level today.  Lab Results  Component Value Date   VD25OH 54.5 01/29/2021   VD25OH 35.5 10/03/2019   - VITAMIN D 25 Hydroxy (Vit-D Deficiency, Fractures)  3. Mixed hyperlipidemia Lipid-lowering medications: Lipitor 80 mg daily.   Plan: Continue Lipitor.  Will check lipid panel today.  Dietary changes: Increase soluble fiber, decrease simple carbohydrates, decrease saturated fat. Exercise changes: Moderate to vigorous-intensity aerobic activity 150 minutes per week or as tolerated. We will continue to monitor along with PCP/specialists as it pertains to her weight loss journey.  The 10-year ASCVD risk score (Arnett DK, et al., 2019) is:  13.1%   Values used to calculate the score:     Age: 66 years     Sex: Female     Is Non-Hispanic African American: No     Diabetic: No     Tobacco smoker: No     Systolic Blood Pressure: 148 mmHg     Is BP treated: Yes     HDL Cholesterol: 53 mg/dL     Total Cholesterol: 180 mg/dL  - Lipid Panel With LDL/HDL Ratio  4. Essential hypertension Elevated today. Medications: Imdur 30 mg daily.   Plan:  Avoid buying foods that are: processed, frozen, or prepackaged to avoid excess salt. We will watch for signs of hypotension as she continues lifestyle modifications.  Will check labs today.  BP Readings from Last 3 Encounters:  06/16/21 (!) 148/87  05/13/21 136/80  03/12/21 (!) 150/85   - CBC with Differential/Platelet - TSH  5. Other depression, with emotional eating Medication: Wellbutrin XL 300 mg daily.  She is followed by Psychiatry.   Plan: Discussed cues and consequences, how thoughts affect eating, model of thoughts, feelings, and behaviors, and strategies for change by focusing on the cue. Discussed cognitive distortions, coping thoughts, and how to change your thoughts.  6. Obesity, current BMI 41.1  Course: Lauren Huffman is currently in the action stage of change. As such, her goal is to continue with weight loss efforts.   Nutrition goals: She has agreed to the Category 2 Plan.   Exercise goals:  As is.  Behavioral modification strategies: increasing lean protein intake, decreasing simple carbohydrates, increasing vegetables, and increasing water  intake.  Lauren Huffman has agreed to follow-up with our clinic in 4 weeks. She was informed of the importance of frequent follow-up visits to maximize her success with intensive lifestyle modifications for her multiple health conditions.   Lauren Huffman was informed we would discuss her lab results at her next visit unless there is a critical issue that needs to be addressed sooner. Lauren Huffman agreed to keep her next visit at the agreed upon time to discuss  these results.  Objective:   Blood pressure (!) 148/87, pulse 88, temperature 97.8 F (36.6 C), temperature source Oral, height 5\' 8"  (1.727 m), weight 270 lb (122.5 kg), SpO2 93 %. Body mass index is 41.05 kg/m.  General: Cooperative, alert, well developed, in no acute distress. HEENT: Conjunctivae and lids unremarkable. Cardiovascular: Regular rhythm.  Lungs: Normal work of breathing. Neurologic: No focal deficits.   Lab Results  Component Value Date   HGBA1C 5.8 (H) 01/29/2021   HGBA1C 5.4 10/03/2019   Lab Results  Component Value Date   INSULIN 15.0 01/29/2021   INSULIN 11.5 10/03/2019   Lab Results  Component Value Date   VD25OH 54.5 01/29/2021   VD25OH 35.5 10/03/2019   Lab Results  Component Value Date   IRON 81 10/03/2019   TIBC 316 10/03/2019   FERRITIN 85 10/03/2019   Attestation Statements:   Reviewed by clinician on day of visit: allergies, medications, problem list, medical history, surgical history, family history, social history, and previous encounter notes.  I, Water quality scientist, CMA, am acting as transcriptionist for Briscoe Deutscher, DO  I have reviewed the above documentation for accuracy and completeness, and I agree with the above. -  Briscoe Deutscher, DO, MS, FAAFP, DABOM - Family and Bariatric Medicine.

## 2021-06-26 ENCOUNTER — Other Ambulatory Visit (INDEPENDENT_AMBULATORY_CARE_PROVIDER_SITE_OTHER): Payer: Self-pay | Admitting: Adult Health

## 2021-06-26 DIAGNOSIS — R7303 Prediabetes: Secondary | ICD-10-CM

## 2021-06-30 ENCOUNTER — Other Ambulatory Visit (INDEPENDENT_AMBULATORY_CARE_PROVIDER_SITE_OTHER): Payer: Self-pay | Admitting: Family Medicine

## 2021-06-30 DIAGNOSIS — R7303 Prediabetes: Secondary | ICD-10-CM

## 2021-06-30 MED ORDER — METFORMIN HCL ER 500 MG PO TB24: 500.0000 mg | ORAL_TABLET | Freq: Every day | ORAL | 0 refills | Status: DC

## 2021-07-07 ENCOUNTER — Encounter (INDEPENDENT_AMBULATORY_CARE_PROVIDER_SITE_OTHER): Payer: Self-pay | Admitting: Family Medicine

## 2021-07-07 ENCOUNTER — Ambulatory Visit (INDEPENDENT_AMBULATORY_CARE_PROVIDER_SITE_OTHER): Payer: Medicare Other | Admitting: Family Medicine

## 2021-07-07 ENCOUNTER — Ambulatory Visit (INDEPENDENT_AMBULATORY_CARE_PROVIDER_SITE_OTHER): Payer: Medicare Other | Admitting: Adult Health

## 2021-07-07 VITALS — BP 149/94 | HR 94 | Temp 97.9°F | Ht 68.0 in | Wt 277.0 lb

## 2021-07-07 DIAGNOSIS — E782 Mixed hyperlipidemia: Secondary | ICD-10-CM

## 2021-07-07 DIAGNOSIS — R7303 Prediabetes: Secondary | ICD-10-CM

## 2021-07-07 DIAGNOSIS — Z6841 Body Mass Index (BMI) 40.0 and over, adult: Secondary | ICD-10-CM

## 2021-07-07 DIAGNOSIS — F3289 Other specified depressive episodes: Secondary | ICD-10-CM

## 2021-07-07 DIAGNOSIS — E669 Obesity, unspecified: Secondary | ICD-10-CM

## 2021-07-07 NOTE — Telephone Encounter (Signed)
Dr.Wallace °

## 2021-07-08 ENCOUNTER — Ambulatory Visit (INDEPENDENT_AMBULATORY_CARE_PROVIDER_SITE_OTHER): Payer: Medicare Other | Admitting: Family Medicine

## 2021-07-14 NOTE — Progress Notes (Signed)
Chief Complaint:   OBESITY Lauren Huffman is here to discuss her progress with her obesity treatment plan along with follow-up of her obesity related diagnoses. See Medical Weight Management Flowsheet for complete bioelectrical impedance results.  Today's visit was #: 77 Starting weight: 268 lbs Starting date: 10/03/2019 Weight change since last visit: +7 lbs Total lbs lost to date: +9 lbs  Nutrition Plan: Category 2 Plan for 10% of the time.  Activity: None.  Interim History: [name] is the POA for a friend who has been in and out of the hospital.  She says she has been doing more eating out recently.  Assessment/Plan:   1. Prediabetes At goal. Goal is HgbA1c < 5.7.  Medication: metformin XR 500 mg daily.    Plan: She will continue to focus on protein-rich, low simple carbohydrate foods. We reviewed the importance of hydration, regular exercise for stress reduction, and restorative sleep.   Lab Results  Component Value Date   HGBA1C 5.7 (H) 06/16/2021   Lab Results  Component Value Date   INSULIN 15.0 01/29/2021   INSULIN 11.5 10/03/2019   2. Mixed hyperlipidemia Course: Improving, but not optimized. Lipid-lowering medications: Lipitor 80 mg daily.   Plan: Dietary changes: Increase soluble fiber, decrease simple carbohydrates, decrease saturated fat. Exercise changes: Moderate to vigorous-intensity aerobic activity 150 minutes per week or as tolerated. We will continue to monitor along with PCP/specialists as it pertains to her weight loss journey.  Lab Results  Component Value Date   CHOL 180 06/16/2021   HDL 53 06/16/2021   LDLCALC 103 (H) 06/16/2021   TRIG 138 06/16/2021   CHOLHDL 3.8 01/29/2021   Lab Results  Component Value Date   ALT 15 06/16/2021   AST 20 06/16/2021   ALKPHOS 117 06/16/2021   BILITOT 0.4 06/16/2021   The 10-year ASCVD risk score (Arnett DK, et al., 2019) is: 13.2%   Values used to calculate the score:     Age: 66 years     Sex: Female     Is  Non-Hispanic African American: No     Diabetic: No     Tobacco smoker: No     Systolic Blood Pressure: 123456 mmHg     Is BP treated: Yes     HDL Cholesterol: 53 mg/dL     Total Cholesterol: 180 mg/dL  3. Other depression, with emotional eating Controlled. Medication: Wellbutrin XL 300 mg daily.   Plan: Discussed cues and consequences, how thoughts affect eating, model of thoughts, feelings, and behaviors, and strategies for change by focusing on the cue. Discussed cognitive distortions, coping thoughts, and how to change your thoughts.  4. Obesity, current BMI 42.2  Course: [name] is currently in the action stage of change. As such, her goal is to continue with weight loss efforts.   Nutrition goals: She has agreed to the Category 2 Plan.   Exercise goals:  Increased NEAT.  Behavioral modification strategies: increasing lean protein intake, decreasing simple carbohydrates, increasing vegetables, and increasing water intake.  [name] has agreed to follow-up with our clinic in 6 weeks. She was informed of the importance of frequent follow-up visits to maximize her success with intensive lifestyle modifications for her multiple health conditions.   Objective:   Blood pressure (!) 149/94, pulse 94, temperature 97.9 F (36.6 C), temperature source Oral, height 5\' 8"  (1.727 m), weight 277 lb (125.6 kg), SpO2 94 %. Body mass index is 42.12 kg/m.  General: Cooperative, alert, well developed, in no acute distress.  HEENT: Conjunctivae and lids unremarkable. Cardiovascular: Regular rhythm.  Lungs: Normal work of breathing. Neurologic: No focal deficits.   Lab Results  Component Value Date   CREATININE 0.73 06/16/2021   BUN 17 06/16/2021   NA 142 06/16/2021   K 4.5 06/16/2021   CL 101 06/16/2021   CO2 25 06/16/2021   Lab Results  Component Value Date   ALT 15 06/16/2021   AST 20 06/16/2021   ALKPHOS 117 06/16/2021   BILITOT 0.4 06/16/2021   Lab Results  Component Value Date    HGBA1C 5.7 (H) 06/16/2021   HGBA1C 5.8 (H) 01/29/2021   HGBA1C 5.4 10/03/2019   Lab Results  Component Value Date   INSULIN 15.0 01/29/2021   INSULIN 11.5 10/03/2019   Lab Results  Component Value Date   TSH 2.460 06/16/2021   Lab Results  Component Value Date   CHOL 180 06/16/2021   HDL 53 06/16/2021   LDLCALC 103 (H) 06/16/2021   TRIG 138 06/16/2021   CHOLHDL 3.8 01/29/2021   Lab Results  Component Value Date   VD25OH 56.9 06/16/2021   VD25OH 54.5 01/29/2021   VD25OH 35.5 10/03/2019   Lab Results  Component Value Date   WBC 8.0 06/16/2021   HGB 15.4 06/16/2021   HCT 45.3 06/16/2021   MCV 86 06/16/2021   PLT 254 06/16/2021   Lab Results  Component Value Date   IRON 81 10/03/2019   TIBC 316 10/03/2019   FERRITIN 85 10/03/2019   Attestation Statements:   Reviewed by clinician on day of visit: allergies, medications, problem list, medical history, surgical history, family history, social history, and previous encounter notes.  I, Water quality scientist, CMA, am acting as transcriptionist for Briscoe Deutscher, DO  I have reviewed the above documentation for accuracy and completeness, and I agree with the above. -  Briscoe Deutscher, DO, MS, FAAFP, DABOM - Family and Bariatric Medicine.

## 2021-07-30 DIAGNOSIS — J069 Acute upper respiratory infection, unspecified: Secondary | ICD-10-CM | POA: Diagnosis not present

## 2021-07-30 DIAGNOSIS — R0989 Other specified symptoms and signs involving the circulatory and respiratory systems: Secondary | ICD-10-CM | POA: Diagnosis not present

## 2021-07-30 DIAGNOSIS — R0981 Nasal congestion: Secondary | ICD-10-CM | POA: Diagnosis not present

## 2021-08-06 ENCOUNTER — Encounter (INDEPENDENT_AMBULATORY_CARE_PROVIDER_SITE_OTHER): Payer: Medicare Other | Admitting: Cardiology

## 2021-08-06 DIAGNOSIS — I10 Essential (primary) hypertension: Secondary | ICD-10-CM

## 2021-08-13 MED ORDER — AMLODIPINE BESYLATE 2.5 MG PO TABS: 2.5000 mg | ORAL_TABLET | Freq: Every day | ORAL | 3 refills | Status: DC

## 2021-08-13 NOTE — Addendum Note (Signed)
Addended by: Theresia Majors on: 08/13/2021 12:09 PM ? ? Modules accepted: Orders ? ?

## 2021-08-13 NOTE — Telephone Encounter (Signed)
Please see the MyChart message reply(ies) for my assessment and plan.  ?  ?This patient gave consent for this Medical Advice Message and is aware that it may result in a bill to [employer], as well as the possibility of receiving a bill for a co-payment or deductible. They are an established patient, but are not seeking medical advice exclusively about a problem treated during an in person or video visit in the last seven days. I did not recommend an in person or video visit within seven days of my reply.  ?  ?I spent a total of 5 minutes cumulative time within 7 days through [employer]. ? ?Armanda Magic, MD   ?

## 2021-08-13 NOTE — Telephone Encounter (Signed)
?  Thank you for your message seeking medical advice.* My assessment and recommendation are as follows: ?Yeng  ?  ?I have reviewed your current medications that you are on.  You are not on a blood pressure drug at this time.  Your bottom number is too high.  I would like to see your BP on average < 130/53mmHg.  I have recommended that you cut out all added salt in your diet and avoid processed meats, frozen dinners and try to eat more fresh fruits and veggies.  Recommend adding Amlodipine 2.5mg  daily.  Please check your BP daily for a week at lunch and send me the results.   ? ?Sincerely,  ?Fransico Him, MD  ? ? ?*This exchange required the expertise of a doctor, nurse practitioner, physician assistant, optometrist or certified nurse midwife and qualifies as a Medical Advice Message, please visit [email or web address] for more details. [facility] will bill your insurance on your behalf; copays and deductibles may apply. Questions? Reply to this message.   ?

## 2021-08-14 DIAGNOSIS — I2510 Atherosclerotic heart disease of native coronary artery without angina pectoris: Secondary | ICD-10-CM | POA: Diagnosis not present

## 2021-08-14 DIAGNOSIS — G4733 Obstructive sleep apnea (adult) (pediatric): Secondary | ICD-10-CM | POA: Diagnosis not present

## 2021-08-14 DIAGNOSIS — R7303 Prediabetes: Secondary | ICD-10-CM | POA: Diagnosis not present

## 2021-08-14 DIAGNOSIS — G8929 Other chronic pain: Secondary | ICD-10-CM | POA: Diagnosis not present

## 2021-08-14 DIAGNOSIS — Z79899 Other long term (current) drug therapy: Secondary | ICD-10-CM | POA: Diagnosis not present

## 2021-09-22 ENCOUNTER — Encounter: Payer: Self-pay | Admitting: Cardiology

## 2021-09-25 DIAGNOSIS — I10 Essential (primary) hypertension: Secondary | ICD-10-CM | POA: Diagnosis not present

## 2021-09-25 DIAGNOSIS — R600 Localized edema: Secondary | ICD-10-CM | POA: Diagnosis not present

## 2021-09-25 DIAGNOSIS — R7303 Prediabetes: Secondary | ICD-10-CM | POA: Diagnosis not present

## 2021-10-09 ENCOUNTER — Other Ambulatory Visit: Payer: Self-pay | Admitting: Family Medicine

## 2021-10-09 DIAGNOSIS — E785 Hyperlipidemia, unspecified: Secondary | ICD-10-CM | POA: Diagnosis not present

## 2021-10-09 DIAGNOSIS — Z1231 Encounter for screening mammogram for malignant neoplasm of breast: Secondary | ICD-10-CM

## 2021-10-09 DIAGNOSIS — R2689 Other abnormalities of gait and mobility: Secondary | ICD-10-CM | POA: Diagnosis not present

## 2021-10-09 DIAGNOSIS — R600 Localized edema: Secondary | ICD-10-CM | POA: Diagnosis not present

## 2021-10-09 DIAGNOSIS — R7303 Prediabetes: Secondary | ICD-10-CM | POA: Diagnosis not present

## 2021-10-09 DIAGNOSIS — I10 Essential (primary) hypertension: Secondary | ICD-10-CM | POA: Diagnosis not present

## 2021-10-29 DIAGNOSIS — R0981 Nasal congestion: Secondary | ICD-10-CM | POA: Diagnosis not present

## 2021-10-29 DIAGNOSIS — U071 COVID-19: Secondary | ICD-10-CM | POA: Diagnosis not present

## 2021-10-29 DIAGNOSIS — H6691 Otitis media, unspecified, right ear: Secondary | ICD-10-CM | POA: Diagnosis not present

## 2021-11-12 ENCOUNTER — Encounter (INDEPENDENT_AMBULATORY_CARE_PROVIDER_SITE_OTHER): Payer: Self-pay

## 2021-11-12 DIAGNOSIS — I10 Essential (primary) hypertension: Secondary | ICD-10-CM | POA: Diagnosis not present

## 2021-11-12 DIAGNOSIS — E65 Localized adiposity: Secondary | ICD-10-CM | POA: Diagnosis not present

## 2021-11-12 DIAGNOSIS — R7303 Prediabetes: Secondary | ICD-10-CM | POA: Diagnosis not present

## 2021-11-12 DIAGNOSIS — E559 Vitamin D deficiency, unspecified: Secondary | ICD-10-CM | POA: Diagnosis not present

## 2021-11-24 ENCOUNTER — Ambulatory Visit: Payer: Medicare Other

## 2021-12-16 DIAGNOSIS — R7303 Prediabetes: Secondary | ICD-10-CM | POA: Diagnosis not present

## 2021-12-16 DIAGNOSIS — Z79899 Other long term (current) drug therapy: Secondary | ICD-10-CM | POA: Diagnosis not present

## 2021-12-16 DIAGNOSIS — R632 Polyphagia: Secondary | ICD-10-CM | POA: Diagnosis not present

## 2021-12-16 DIAGNOSIS — I2510 Atherosclerotic heart disease of native coronary artery without angina pectoris: Secondary | ICD-10-CM | POA: Diagnosis not present

## 2021-12-16 DIAGNOSIS — R0602 Shortness of breath: Secondary | ICD-10-CM | POA: Diagnosis not present

## 2022-01-13 DIAGNOSIS — R7303 Prediabetes: Secondary | ICD-10-CM | POA: Diagnosis not present

## 2022-01-13 DIAGNOSIS — E65 Localized adiposity: Secondary | ICD-10-CM | POA: Diagnosis not present

## 2022-01-30 ENCOUNTER — Ambulatory Visit
Admission: RE | Admit: 2022-01-30 | Discharge: 2022-01-30 | Disposition: A | Discharge status: Home / Self Care | Payer: Medicare Other | Source: Ambulatory Visit | Attending: Family Medicine | Admitting: Family Medicine

## 2022-01-30 DIAGNOSIS — H43393 Other vitreous opacities, bilateral: Secondary | ICD-10-CM | POA: Diagnosis not present

## 2022-01-30 DIAGNOSIS — Z1231 Encounter for screening mammogram for malignant neoplasm of breast: Secondary | ICD-10-CM

## 2022-02-06 ENCOUNTER — Other Ambulatory Visit: Payer: Self-pay | Admitting: Cardiology

## 2022-02-18 DIAGNOSIS — I152 Hypertension secondary to endocrine disorders: Secondary | ICD-10-CM | POA: Diagnosis not present

## 2022-02-18 DIAGNOSIS — E1169 Type 2 diabetes mellitus with other specified complication: Secondary | ICD-10-CM | POA: Diagnosis not present

## 2022-02-18 DIAGNOSIS — N3281 Overactive bladder: Secondary | ICD-10-CM | POA: Diagnosis not present

## 2022-02-18 DIAGNOSIS — G4733 Obstructive sleep apnea (adult) (pediatric): Secondary | ICD-10-CM | POA: Diagnosis not present

## 2022-02-25 NOTE — Progress Notes (Signed)
Cardiology Office Note:    Date:  03/11/2022   ID:  Lauren Huffman, Lauren Huffman [birth year], MRN [id number]  PCP:  Gweneth Dimitri, MD  [facility] Providers Cardiologist:  Armanda Magic, MD     Referring MD: Gweneth Dimitri, MD   Chief Complaint:  No chief complaint on file.     History of Present Illness:   Lauren Huffman is a 66 y.o. female with a history of SVT,carpal tunnel, HTN, HLD, depression, anxiety, sleep apnea (unable to tolerate CPAP) & varicose veins s/p ablation. FH + for CAD.    She was in the ER October 2018 with symptomatic palpitations. Found to be in SVT - treated with Adenosine and converted - then started on CCB which she hasn't tolerated. She reported prior side effects with Metoprolol. She has not been able to tolerate CPAP. Echo was basically normal. She declined event monitor due to cost.    I saw the patient 11/14/2019 for dyspnea on exertion after she had canceled 3 appointments because of anxiety.  The echo 11/30/2019 normal LVEF 60 to 65% grade 1 DD.  Coronary CTA 12/04/2019 coronary calcium score 156 85th percentile for age and sex matched control moderate 50 to 69% proximal and greater than 70% mixed plaque in the LAD and diagonal.  Showed there is obstructive plaque in the mid diagonal branch but main epicardial arteries have normal flow.  Dr. Mayford Knife reviewed recommended aspirin 81 mg daily Imdur 30 mg daily and changed her to atorvastatin 40 mg daily.  Patient last saw Dr. Mayford Knife 01/2021 and was doing well.  Patient comes in for f/u. Doing better now. BP doing well. No chest pain, dyspnea, palpitations. Does have some DOE carrying something up stairs she attributes to her weight. No regular exercise. She walks with a cane. Would like to go to a pool but hasn't done it. Takes care of 2 1/2 yr old grandchild on occasion.           Past Medical History:  Diagnosis Date   Aneurysm, lower extremity (HCC)    Anxiety    Arthritis    Bipolar 2 disorder (HCC)    CAD  (coronary artery disease)    a. by Cor CTA 11/2019.   Carpal tunnel syndrome    Right   Chronic pain    Depression    GERD (gastroesophageal reflux disease)    Headache(784.0)    Hiatal hernia    History of colon polyps    History of iron deficiency    Hyperlipemia    Hypertension    Joint pain    Lactose intolerance    Left arm pain    Lower extremity edema    Memory difficulty    Mental disorder    Obesity    OSA (obstructive sleep apnea)    Osteoarthritis    Prediabetes    Pulmonary nodules    Sleep apnea    DOES NOT WEAR CPAP     Sleep disturbance    SOB (shortness of breath)    Status post ablation of incompetent vein using laser 2x in Nov. 2017   right   SVT (supraventricular tachycardia)    TMJ (dislocation of temporomandibular joint)    Varicose veins    Vitamin D insufficiency    Current Medications: Current Meds  Medication Sig   aspirin EC 81 MG tablet Take 1 tablet (81 mg total) by mouth daily. Swallow whole.   atorvastatin (LIPITOR) 80 MG tablet Take 1 tablet (  80 mg total) by mouth daily.   buPROPion (WELLBUTRIN XL) 150 MG 24 hr tablet Take 300 mg by mouth daily.    Calcium-Phosphorus-Vitamin D (CALCIUM GUMMIES PO) Take 500 mg by mouth.   Cyanocobalamin 3000 MCG CAPS Take 3,000 capsules by mouth once a week.   divalproex (DEPAKOTE ER) 500 MG 24 hr tablet 1/2 tab AM,- 1 tab PM   ELDERBERRY PO Take by mouth daily.   folic acid (FOLVITE) 1 MG tablet Take 2 mg by mouth daily.    isosorbide mononitrate (IMDUR) 30 MG 24 hr tablet TAKE 1 TABLET(30 MG) BY MOUTH DAILY   loratadine (CLARITIN) 10 MG tablet Take 10 mg by mouth daily.   Multiple Vitamins-Minerals (MULTIVITAMIN GUMMIES ADULT PO) Take 1 tablet by mouth daily.    omeprazole (PRILOSEC) 40 MG capsule Take 40 mg by mouth 2 (two) times daily.    Probiotic Product (ALIGN) CHEW Chew 1 tablet by mouth daily.   risperiDONE (RISPERDAL) 0.5 MG tablet Take 0.5 mg by mouth at bedtime.   Semaglutide,0.25 or  0.5MG /DOS, (OZEMPIC, 0.25 OR 0.5 MG/DOSE,) 2 MG/1.5ML SOPN    TURMERIC PO Take 1,075 mg by mouth daily.   vitamin C (ASCORBIC ACID) 500 MG tablet Take 500 mg by mouth daily.   Vitamin D, Ergocalciferol, (DRISDOL) 1.25 MG (50000 UNIT) CAPS capsule Take 1 capsule (50,000 Units total) by mouth every 7 (seven) days.    Allergies:   Cartia xt [diltiazem hcl er beads], Gabapentin, Metoprolol, Sulfa antibiotics, and Trazodone   Social History   Tobacco Use   Smoking status: Never   Smokeless tobacco: Never  Vaping Use   Vaping Use: Never used  Substance Use Topics   Alcohol use: Not Currently   Drug use: No    Family Hx: The patient's family history includes CAD in her mother; Cancer in her father; Diabetes in her father; Heart disease in her father and mother; Hyperlipidemia in her mother and sister; Memory loss in her mother; Seizures in her mother; Stroke in her mother; Supraventricular tachycardia in her sister; Thyroid disease in her mother.  ROS     Physical Exam:    VS:  BP 122/86 (BP Location: Right Leg, Patient Position: Sitting, Cuff Size: Large)   Pulse 89   Ht 5\' 8"  (1.727 m)   Wt 291 lb 6 oz (132.2 kg)   SpO2 91%   BMI 44.30 kg/m     Wt Readings from Last 3 Encounters:  03/11/22 291 lb 6 oz (132.2 kg)  07/07/21 277 lb (125.6 kg)  06/16/21 270 lb (122.5 kg)    Physical Exam  GEN: Obese, in no acute distress  Neck: no JVD, carotid bruits, or masses Cardiac:RRR; no murmurs, rubs, or gallops  Respiratory:  clear to auscultation bilaterally, normal work of breathing GI: soft, nontender, nondistended, + BS Ext: without cyanosis, clubbing, or edema, Good distal pulses bilaterally Neuro:  Alert and Oriented x 3,  Psych: euthymic mood, full affect        EKGs/Labs/Other Test Reviewed:    EKG:  EKG is  ordered today.  The ekg ordered today demonstrates NSR normal EKG  Recent Labs: 06/16/2021: ALT 15; BUN 17; Creatinine, Ser 0.73; Hemoglobin 15.4; Platelets 254;  Potassium 4.5; Sodium 142; TSH 2.460   Recent Lipid Panel Recent Labs    06/16/21 1255  CHOL 180  TRIG 138  HDL 53  LDLCALC 103*     Prior CV Studies:   Coronary CTA and FFR 9/2021IMPRESSION: 1. Coronary calcium  score of 156. This was 85th percentile for age and sex matched control.   2. Normal coronary origin with right dominance.   3. Moderate (50-69%) proximal and severe (>70%) mixed plaque in the LAD and D1. CAD-RADS 4.   4.  Consider cardiac catheterization.   Chilton Siiffany [place], MD     Electronically Signed   By: Chilton Siiffany     On: 12/04/2019 17:39 FINDINGS: FFRct analysis was performed on the original cardiac CT angiogram dataset. Diagrammatic representation of the FFRct analysis is provided in a separate PDF document in PACS. This dictation was created using the PDF document and an interactive 3D model of the results. 3D model is not available in the EMR/PACS. Normal FFR range is >0.80.   1. Left Main: No significant stenosis. LM FFR = 0.99.   2. LAD: No significant stenosis. Proximal FFR = 0.99, Mid FFR = 0.97, Distal FFR = 0.93.   3. LCX: No significant stenosis. Proximal FFR = 0.98, Mid FFR = 0.97, Distal FFR = 0.95   4. OM1: No significant stenosis. Proximal FFR = 0.98, Mid to distal FFR = 0.87.   5. RCA: No significant stenosis. Proximal FFR = 0.98, Mid FFR = 0.95, Distal FFR = 0.90.   IMPRESSION:: IMPRESSION: 1. Coronary CT FFR flow analysis demonstrates possible flow limiting lesion in the diagonal #1 branch (FFR 0.79 mid and 0.76 distal). Normal flow in RCA, LCX and LAD.   2.  Recommend medical management.   Traci Turner     Electronically Signed   By: Armanda Magicraci  Turner   On: 12/06/2019 12:46   2D echo 8/2021IMPRESSIONS     1. Left ventricular ejection fraction, by estimation, is 60 to 65%. The  left ventricle has normal function. The left ventricle has no regional  wall motion abnormalities. Left ventricular diastolic parameters  are  consistent with Grade I diastolic  dysfunction (impaired relaxation). The average left ventricular global  longitudinal strain is -18.5 %. The global longitudinal strain is normal.   2. Right ventricular systolic function is normal. The right ventricular  size is normal. There is normal pulmonary artery systolic pressure.   3. The mitral valve is normal in structure. No evidence of mitral valve  regurgitation. No evidence of mitral stenosis.   4. The aortic valve is normal in structure. Aortic valve regurgitation is  not visualized. No aortic stenosis is present.   5. The inferior vena cava is normal in size with greater than 50%  respiratory variability, suggesting right atrial pressure of 3 mmHg.   Comparison(s): 02/03/17 EF 55-60%.          Risk Assessment/Calculations/Metrics:              ASSESSMENT & PLAN:   No problem-specific Assessment & Plan notes found for this encounter.   ASCAD - Coronary CTA 12/04/2019 showed ronary calcium score 156 that was 85th percentile for age and sex matched control ,moderate 50 to 69% proximal and > 70% mixed plaque in the LAD and diagonal.   -FFR c/w obstructive plaque in the mid diagonal branch but main epicardial arteries had normal flow.with obstructive plaque in the mid diagonal branch on coronary CTA and FFR  -Medical management recommended  -She has not had any anginal chest pain  -Continue on aspirin, Imdur 30mg  daily and atorvastatin.   -LDL 103 06/2021 will repeat and most likely add zetia  HTN  -BP now controlled, didn't tolerate amlodipine-swelling   SVT -She was intolerant to metoprolol and Cardizem -She  has not had any recent palpitations   History of OSA  -could not tolerate CPAP   Hyperlipidemia  -LDL goal less than 70  -LDL 103 06/2021. Will repeat FLP and if still up will add zetia 10 mg daily   Obesity  -going to weight loss center.  Just started ozempic -Recommend 150 minutes of exercise weekly.                 Dispo:  No follow-ups on file.   Medication Adjustments/Labs and Tests Ordered: Current medicines are reviewed at length with the patient today.  Concerns regarding medicines are outlined above.  Tests Ordered: Orders Placed This Encounter  Procedures   ALT   Lipid panel   EKG 12-Lead   Medication Changes: No orders of the defined types were placed in this encounter.  Signed, Jacolyn Reedy, PA-C  03/11/2022 11:22 AM    [facility] [address] [place], [place], [place]  [address] Phone: [phone]; Fax: [phone]

## 2022-03-08 ENCOUNTER — Other Ambulatory Visit: Payer: Self-pay | Admitting: Cardiology

## 2022-03-10 ENCOUNTER — Other Ambulatory Visit: Payer: Self-pay | Admitting: Cardiology

## 2022-03-11 ENCOUNTER — Ambulatory Visit: Payer: Medicare Other | Attending: Physician Assistant | Admitting: Physician Assistant

## 2022-03-11 ENCOUNTER — Encounter: Payer: Self-pay | Admitting: Physician Assistant

## 2022-03-11 VITALS — BP 122/86 | HR 89 | Ht 68.0 in | Wt 291.4 lb

## 2022-03-11 DIAGNOSIS — Z6841 Body Mass Index (BMI) 40.0 and over, adult: Secondary | ICD-10-CM

## 2022-03-11 DIAGNOSIS — E65 Localized adiposity: Secondary | ICD-10-CM | POA: Diagnosis not present

## 2022-03-11 DIAGNOSIS — I10 Essential (primary) hypertension: Secondary | ICD-10-CM | POA: Diagnosis not present

## 2022-03-11 DIAGNOSIS — I152 Hypertension secondary to endocrine disorders: Secondary | ICD-10-CM | POA: Diagnosis not present

## 2022-03-11 DIAGNOSIS — I2510 Atherosclerotic heart disease of native coronary artery without angina pectoris: Secondary | ICD-10-CM

## 2022-03-11 DIAGNOSIS — G4733 Obstructive sleep apnea (adult) (pediatric): Secondary | ICD-10-CM

## 2022-03-11 DIAGNOSIS — I471 Supraventricular tachycardia, unspecified: Secondary | ICD-10-CM | POA: Diagnosis not present

## 2022-03-11 DIAGNOSIS — E1169 Type 2 diabetes mellitus with other specified complication: Secondary | ICD-10-CM | POA: Diagnosis not present

## 2022-03-11 DIAGNOSIS — E785 Hyperlipidemia, unspecified: Secondary | ICD-10-CM | POA: Diagnosis not present

## 2022-03-11 DIAGNOSIS — N3281 Overactive bladder: Secondary | ICD-10-CM | POA: Diagnosis not present

## 2022-03-11 NOTE — Patient Instructions (Signed)
Medication Instructions:  Your physician recommends that you continue on your current medications as directed. Please refer to the Current Medication list given to you today.  *If you need a refill on your cardiac medications before your next appointment, please call your pharmacy*  Lab Work: Fasting lipids and ALT on 12/8 anytime between 7:15am and 5:00pm If you have labs (blood work) drawn today and your tests are completely normal, you will receive your results only by: MyChart Message (if you have MyChart) OR A paper copy in the mail If you have any lab test that is abnormal or we need to change your treatment, we will call you to review the results.  Follow-Up: At [facility], you and your health needs are our priority.  As part of our continuing mission to provide you with exceptional heart care, we have created designated Provider Care Teams.  These Care Teams include your primary Cardiologist (physician) and Advanced Practice Providers (APPs -  Physician Assistants and Nurse Practitioners) who all work together to provide you with the care you need, when you need it.  Your next appointment:   1 year(s)  The format for your next appointment:   In Person  Provider:   Armanda Magic, MD     Other Instructions Your physician discussed the importance of regular exercise and recommended that you start or continue a regular exercise program for good health. It is recommended that you get 150 minutes of exercise per week.     Important Information About Sugar

## 2022-03-13 ENCOUNTER — Other Ambulatory Visit: Payer: Medicare Other

## 2022-03-16 ENCOUNTER — Other Ambulatory Visit: Payer: Medicare Other

## 2022-03-17 ENCOUNTER — Other Ambulatory Visit: Payer: Medicare Other

## 2022-03-18 ENCOUNTER — Other Ambulatory Visit: Payer: Medicare Other

## 2022-03-20 ENCOUNTER — Other Ambulatory Visit: Payer: Medicare Other

## 2022-03-24 ENCOUNTER — Ambulatory Visit: Payer: Medicare Other | Attending: Physician Assistant

## 2022-03-24 DIAGNOSIS — I2510 Atherosclerotic heart disease of native coronary artery without angina pectoris: Secondary | ICD-10-CM

## 2022-03-24 DIAGNOSIS — I10 Essential (primary) hypertension: Secondary | ICD-10-CM | POA: Diagnosis not present

## 2022-03-24 LAB — LIPID PANEL
Chol/HDL Ratio: 3.1 ratio (ref 0.0–4.4)
Cholesterol, Total: 145 mg/dL (ref 100–199)
HDL: 47 mg/dL (ref >39)
LDL Chol Calc (NIH): 75 mg/dL (ref 0–99)
Triglycerides: 127 mg/dL (ref 0–149)
VLDL Cholesterol Cal: 23 mg/dL (ref 5–40)

## 2022-03-24 LAB — ALT: ALT: 14 IU/L (ref 0–32)

## 2022-04-08 DIAGNOSIS — R600 Localized edema: Secondary | ICD-10-CM | POA: Diagnosis not present

## 2022-04-08 DIAGNOSIS — N3281 Overactive bladder: Secondary | ICD-10-CM | POA: Diagnosis not present

## 2022-04-08 DIAGNOSIS — R7303 Prediabetes: Secondary | ICD-10-CM | POA: Diagnosis not present

## 2022-05-18 DIAGNOSIS — E559 Vitamin D deficiency, unspecified: Secondary | ICD-10-CM | POA: Diagnosis not present

## 2022-05-18 DIAGNOSIS — K219 Gastro-esophageal reflux disease without esophagitis: Secondary | ICD-10-CM | POA: Diagnosis not present

## 2022-05-18 DIAGNOSIS — I25119 Atherosclerotic heart disease of native coronary artery with unspecified angina pectoris: Secondary | ICD-10-CM | POA: Diagnosis not present

## 2022-05-18 DIAGNOSIS — Z0000 Encounter for general adult medical examination without abnormal findings: Secondary | ICD-10-CM | POA: Diagnosis not present

## 2022-05-18 DIAGNOSIS — G4733 Obstructive sleep apnea (adult) (pediatric): Secondary | ICD-10-CM | POA: Diagnosis not present

## 2022-05-18 DIAGNOSIS — Z79899 Other long term (current) drug therapy: Secondary | ICD-10-CM | POA: Diagnosis not present

## 2022-06-06 ENCOUNTER — Other Ambulatory Visit: Payer: Self-pay | Admitting: Cardiology

## 2022-06-24 DIAGNOSIS — K529 Noninfective gastroenteritis and colitis, unspecified: Secondary | ICD-10-CM | POA: Diagnosis not present

## 2022-06-24 DIAGNOSIS — K219 Gastro-esophageal reflux disease without esophagitis: Secondary | ICD-10-CM | POA: Diagnosis not present

## 2022-06-24 DIAGNOSIS — E1169 Type 2 diabetes mellitus with other specified complication: Secondary | ICD-10-CM | POA: Diagnosis not present

## 2022-08-04 DIAGNOSIS — E1169 Type 2 diabetes mellitus with other specified complication: Secondary | ICD-10-CM | POA: Diagnosis not present

## 2022-08-04 DIAGNOSIS — R600 Localized edema: Secondary | ICD-10-CM | POA: Diagnosis not present

## 2022-08-17 DIAGNOSIS — M76822 Posterior tibial tendinitis, left leg: Secondary | ICD-10-CM | POA: Diagnosis not present

## 2022-08-17 DIAGNOSIS — M2141 Flat foot [pes planus] (acquired), right foot: Secondary | ICD-10-CM | POA: Diagnosis not present

## 2022-08-17 DIAGNOSIS — M79675 Pain in left toe(s): Secondary | ICD-10-CM | POA: Diagnosis not present

## 2022-08-17 DIAGNOSIS — M2142 Flat foot [pes planus] (acquired), left foot: Secondary | ICD-10-CM | POA: Diagnosis not present

## 2022-08-20 DIAGNOSIS — Z79899 Other long term (current) drug therapy: Secondary | ICD-10-CM | POA: Diagnosis not present

## 2022-08-20 DIAGNOSIS — E559 Vitamin D deficiency, unspecified: Secondary | ICD-10-CM | POA: Diagnosis not present

## 2022-08-21 IMAGING — CT CT HEAD W/O CM
3 series · 17 of 37 positions shown, 19 images · non-contrast
Comparison: None.

CLINICAL DATA: Blunt trauma to the posterior aspect of the head,
initial encounter

EXAM:
CT HEAD WITHOUT CONTRAST
TECHNIQUE: Contiguous axial images were obtained from the base of the skull
through the vertex without intravenous contrast.

[Series 3: head without · axial · non-contrast · 0.48mm/px · z∈[-68,+52]mm · 7 of 34 slices shown, 9 images]
[im 5/34  brain]
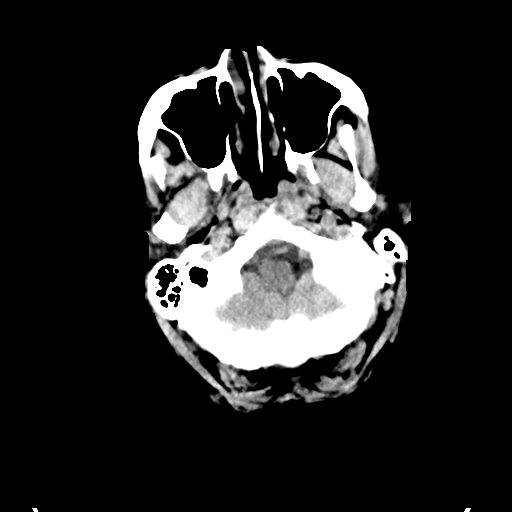
[im 5/34  bone]
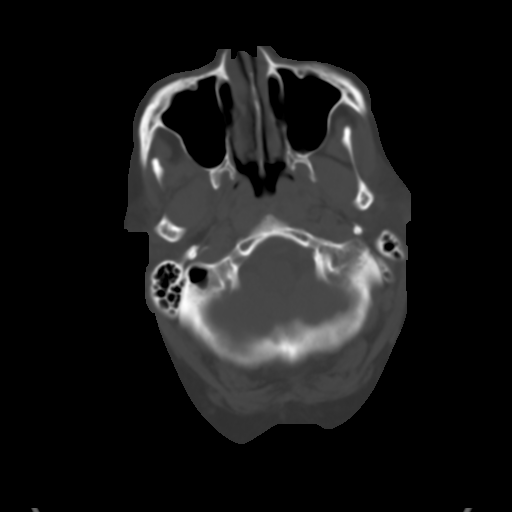
[im 9/34  brain]
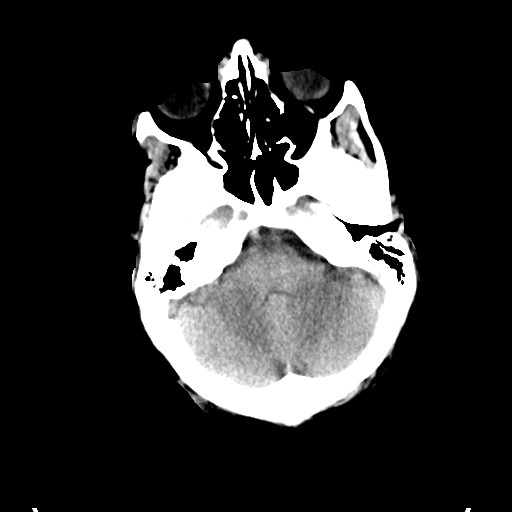
[im 13/34  brain]
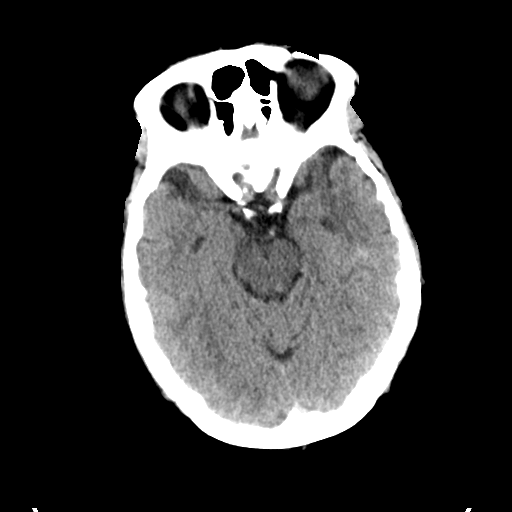
[im 17/34  brain]
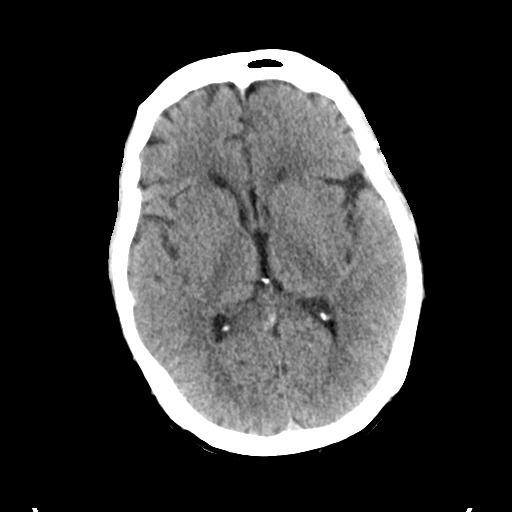
[im 21/34  brain]
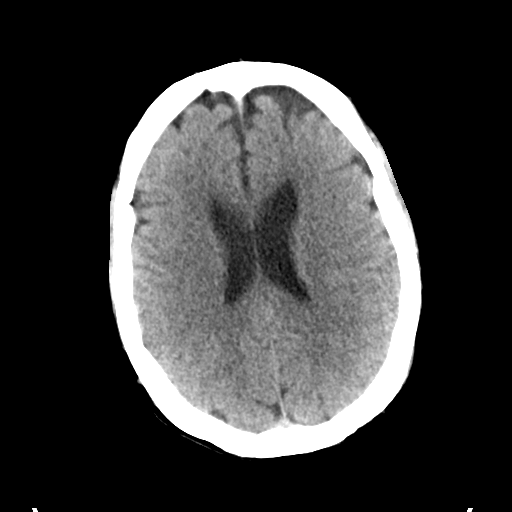
[im 21/34  bone]
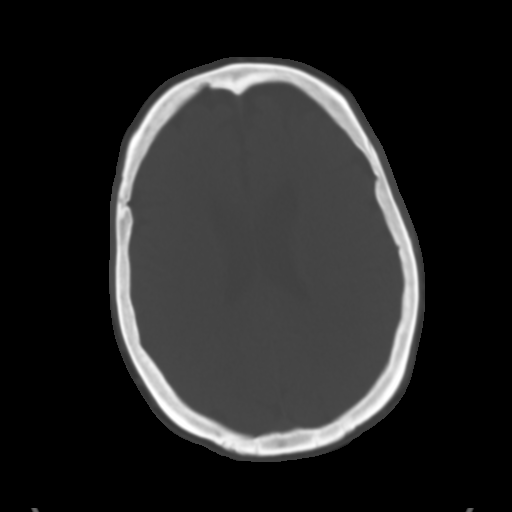
[im 25/34  brain]
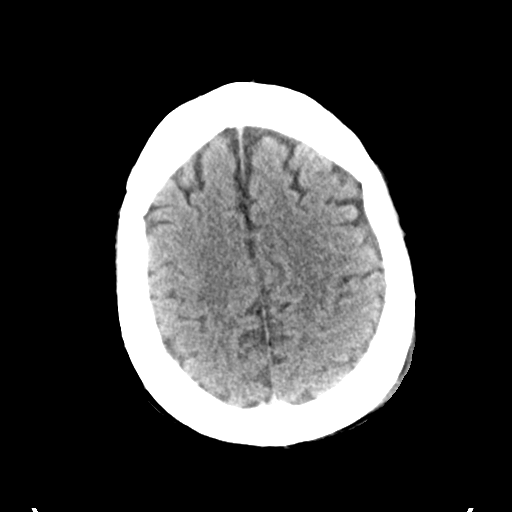
[im 29/34  brain]
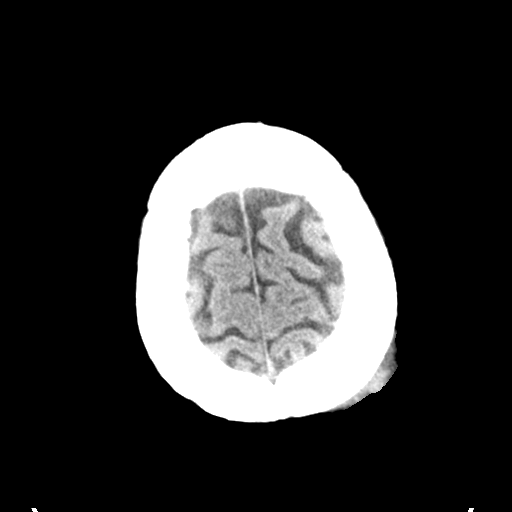

[Series 4: head bone · axial · 0.48mm/px · z∈[-72,+42]mm · 7 of 83 slices shown]
[im 9/83  bone]
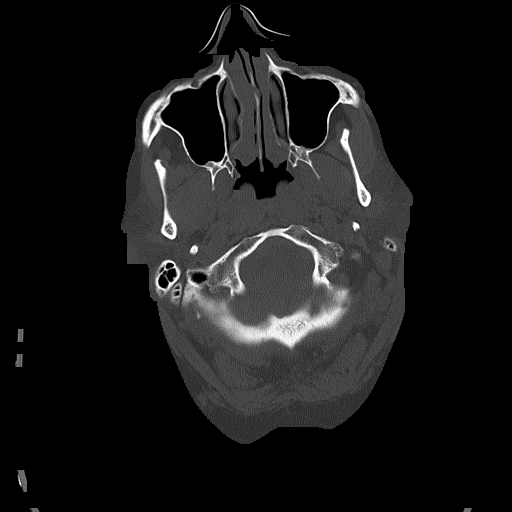
[im 17/83  bone]
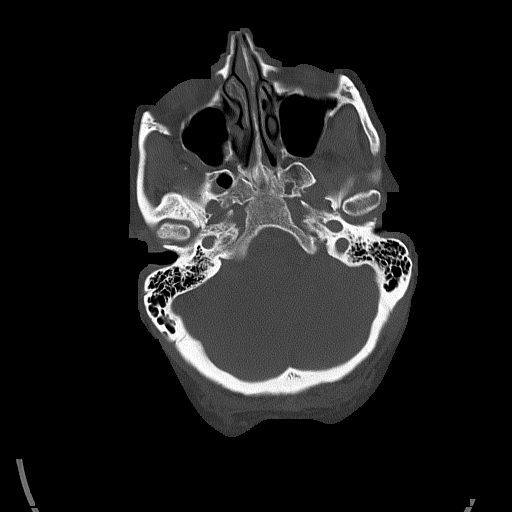
[im 25/83  bone]
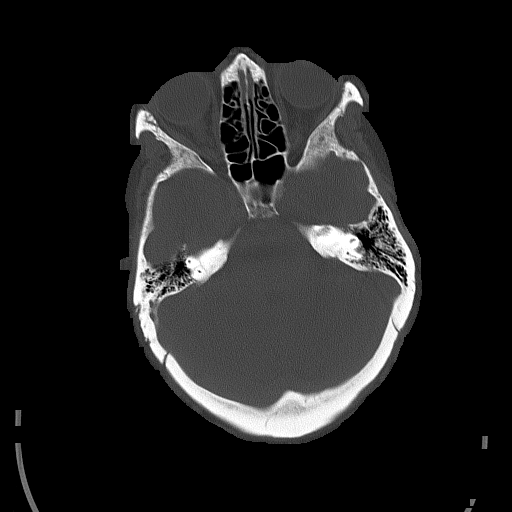
[im 37/83  bone]
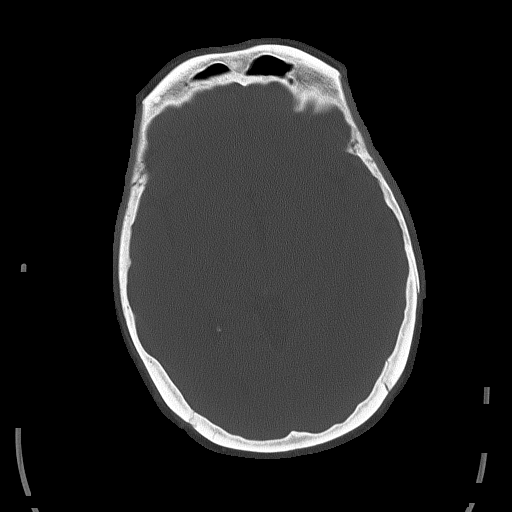
[im 46/83  bone]
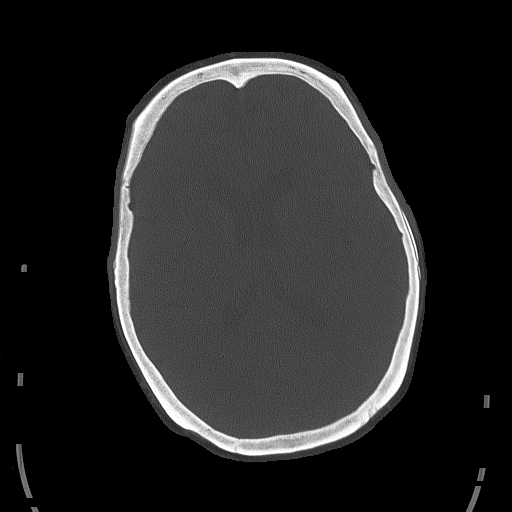
[im 58/83  bone]
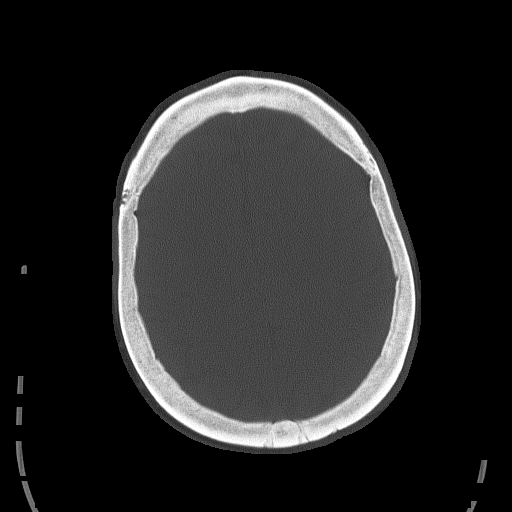
[im 66/83  bone]
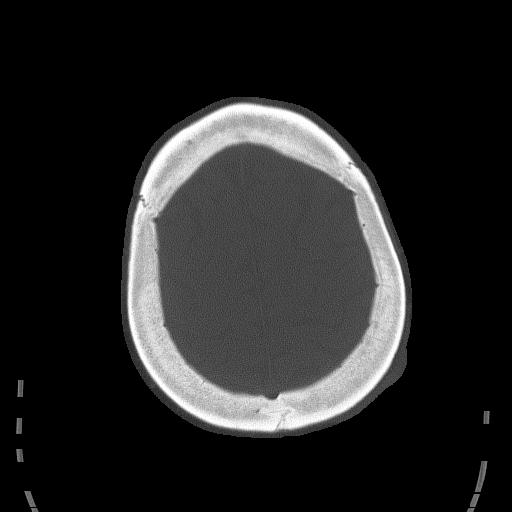

[Series 6: head without sag · sagittal · non-contrast · 0.37mm/px · 3 of 67 slices shown]
[im 23/67  brain]
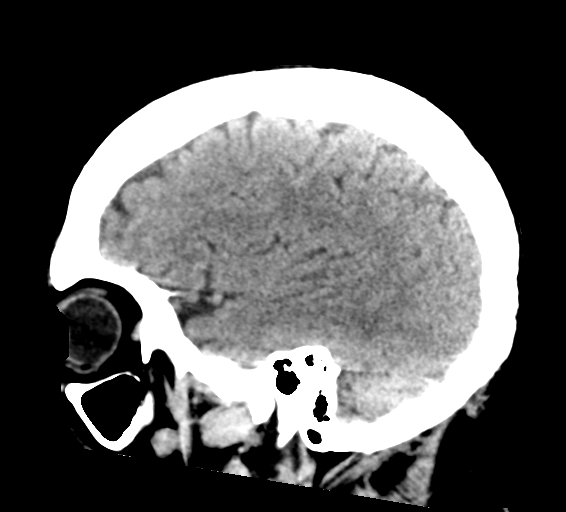
[im 34/67  brain]
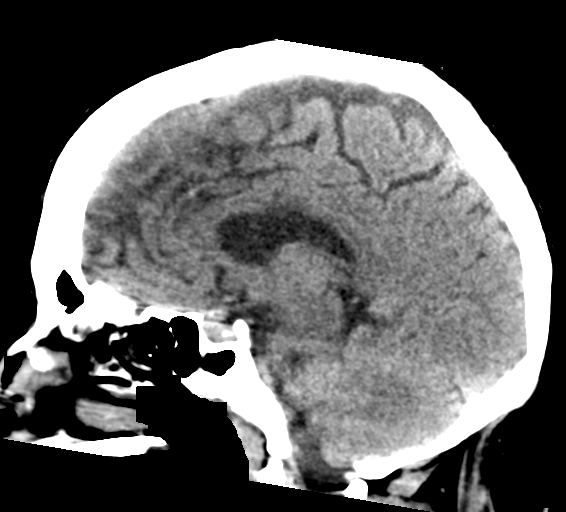
[im 45/67  brain]
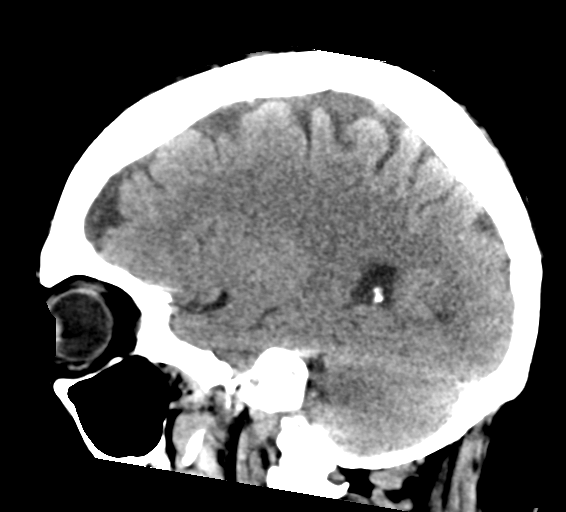

[17 of 37 positions shown; findings below may reference images not displayed]

FINDINGS: Brain: No evidence of acute infarction, hemorrhage, hydrocephalus,
extra-axial collection or mass lesion/mass effect.

Vascular: No hyperdense vessel or unexpected calcification.

Skull: Normal. Negative for fracture or focal lesion.

Sinuses/Orbits: No acute finding.

Other: Scalp swelling is noted in the left posterior parietal region
consistent with the given clinical history.
IMPRESSION: Scalp injury in the left posterior parietal region. No acute
intracranial abnormality noted.

## 2022-08-26 DIAGNOSIS — M19071 Primary osteoarthritis, right ankle and foot: Secondary | ICD-10-CM | POA: Diagnosis not present

## 2022-08-26 DIAGNOSIS — M19072 Primary osteoarthritis, left ankle and foot: Secondary | ICD-10-CM | POA: Diagnosis not present

## 2022-09-09 DIAGNOSIS — B351 Tinea unguium: Secondary | ICD-10-CM | POA: Diagnosis not present

## 2022-09-09 DIAGNOSIS — M79675 Pain in left toe(s): Secondary | ICD-10-CM | POA: Diagnosis not present

## 2022-09-09 DIAGNOSIS — L84 Corns and callosities: Secondary | ICD-10-CM | POA: Diagnosis not present

## 2022-09-09 DIAGNOSIS — M76822 Posterior tibial tendinitis, left leg: Secondary | ICD-10-CM | POA: Diagnosis not present

## 2022-09-16 DIAGNOSIS — E559 Vitamin D deficiency, unspecified: Secondary | ICD-10-CM | POA: Diagnosis not present

## 2022-09-16 DIAGNOSIS — E1169 Type 2 diabetes mellitus with other specified complication: Secondary | ICD-10-CM | POA: Diagnosis not present

## 2022-10-07 DIAGNOSIS — E1169 Type 2 diabetes mellitus with other specified complication: Secondary | ICD-10-CM | POA: Diagnosis not present

## 2022-10-07 DIAGNOSIS — R110 Nausea: Secondary | ICD-10-CM | POA: Diagnosis not present

## 2022-10-20 DIAGNOSIS — D2239 Melanocytic nevi of other parts of face: Secondary | ICD-10-CM | POA: Diagnosis not present

## 2022-10-20 DIAGNOSIS — L814 Other melanin hyperpigmentation: Secondary | ICD-10-CM | POA: Diagnosis not present

## 2022-10-20 DIAGNOSIS — L218 Other seborrheic dermatitis: Secondary | ICD-10-CM | POA: Diagnosis not present

## 2022-10-20 DIAGNOSIS — B078 Other viral warts: Secondary | ICD-10-CM | POA: Diagnosis not present

## 2022-10-20 DIAGNOSIS — D2262 Melanocytic nevi of left upper limb, including shoulder: Secondary | ICD-10-CM | POA: Diagnosis not present

## 2022-10-20 DIAGNOSIS — D2271 Melanocytic nevi of right lower limb, including hip: Secondary | ICD-10-CM | POA: Diagnosis not present

## 2022-10-20 DIAGNOSIS — D1801 Hemangioma of skin and subcutaneous tissue: Secondary | ICD-10-CM | POA: Diagnosis not present

## 2022-10-20 DIAGNOSIS — D2261 Melanocytic nevi of right upper limb, including shoulder: Secondary | ICD-10-CM | POA: Diagnosis not present

## 2022-10-20 DIAGNOSIS — D2371 Other benign neoplasm of skin of right lower limb, including hip: Secondary | ICD-10-CM | POA: Diagnosis not present

## 2022-10-20 DIAGNOSIS — D225 Melanocytic nevi of trunk: Secondary | ICD-10-CM | POA: Diagnosis not present

## 2022-10-20 DIAGNOSIS — L821 Other seborrheic keratosis: Secondary | ICD-10-CM | POA: Diagnosis not present

## 2022-10-20 DIAGNOSIS — D2272 Melanocytic nevi of left lower limb, including hip: Secondary | ICD-10-CM | POA: Diagnosis not present

## 2022-11-04 DIAGNOSIS — B351 Tinea unguium: Secondary | ICD-10-CM | POA: Diagnosis not present

## 2022-11-04 DIAGNOSIS — L84 Corns and callosities: Secondary | ICD-10-CM | POA: Diagnosis not present

## 2022-11-04 DIAGNOSIS — M76822 Posterior tibial tendinitis, left leg: Secondary | ICD-10-CM | POA: Diagnosis not present

## 2022-11-09 DIAGNOSIS — B372 Candidiasis of skin and nail: Secondary | ICD-10-CM | POA: Diagnosis not present

## 2022-12-23 DIAGNOSIS — R600 Localized edema: Secondary | ICD-10-CM | POA: Diagnosis not present

## 2022-12-23 DIAGNOSIS — E1169 Type 2 diabetes mellitus with other specified complication: Secondary | ICD-10-CM | POA: Diagnosis not present

## 2023-01-04 ENCOUNTER — Other Ambulatory Visit: Payer: Self-pay | Admitting: Family Medicine

## 2023-01-04 DIAGNOSIS — Z1231 Encounter for screening mammogram for malignant neoplasm of breast: Secondary | ICD-10-CM

## 2023-01-20 DIAGNOSIS — R262 Difficulty in walking, not elsewhere classified: Secondary | ICD-10-CM | POA: Diagnosis not present

## 2023-01-20 DIAGNOSIS — E1169 Type 2 diabetes mellitus with other specified complication: Secondary | ICD-10-CM | POA: Diagnosis not present

## 2023-02-01 ENCOUNTER — Ambulatory Visit
Admission: RE | Admit: 2023-02-01 | Discharge: 2023-02-01 | Disposition: A | Discharge status: Home / Self Care | Payer: Medicare Other | Source: Ambulatory Visit | Attending: Family Medicine | Admitting: Family Medicine

## 2023-02-01 DIAGNOSIS — Z1231 Encounter for screening mammogram for malignant neoplasm of breast: Secondary | ICD-10-CM | POA: Diagnosis not present

## 2023-02-18 DIAGNOSIS — R7303 Prediabetes: Secondary | ICD-10-CM | POA: Diagnosis not present

## 2023-02-18 DIAGNOSIS — R600 Localized edema: Secondary | ICD-10-CM | POA: Diagnosis not present

## 2023-05-17 DIAGNOSIS — R600 Localized edema: Secondary | ICD-10-CM | POA: Diagnosis not present

## 2023-05-17 DIAGNOSIS — E559 Vitamin D deficiency, unspecified: Secondary | ICD-10-CM | POA: Diagnosis not present

## 2023-05-17 DIAGNOSIS — R7303 Prediabetes: Secondary | ICD-10-CM | POA: Diagnosis not present

## 2023-05-20 DIAGNOSIS — E559 Vitamin D deficiency, unspecified: Secondary | ICD-10-CM | POA: Diagnosis not present

## 2023-05-20 DIAGNOSIS — Z79899 Other long term (current) drug therapy: Secondary | ICD-10-CM | POA: Diagnosis not present

## 2023-05-20 DIAGNOSIS — H43393 Other vitreous opacities, bilateral: Secondary | ICD-10-CM | POA: Diagnosis not present

## 2023-05-24 DIAGNOSIS — I25118 Atherosclerotic heart disease of native coronary artery with other forms of angina pectoris: Secondary | ICD-10-CM | POA: Diagnosis not present

## 2023-05-24 DIAGNOSIS — K219 Gastro-esophageal reflux disease without esophagitis: Secondary | ICD-10-CM | POA: Diagnosis not present

## 2023-05-24 DIAGNOSIS — Z79899 Other long term (current) drug therapy: Secondary | ICD-10-CM | POA: Diagnosis not present

## 2023-05-24 DIAGNOSIS — R7303 Prediabetes: Secondary | ICD-10-CM | POA: Diagnosis not present

## 2023-05-24 DIAGNOSIS — I10 Essential (primary) hypertension: Secondary | ICD-10-CM | POA: Diagnosis not present

## 2023-05-24 DIAGNOSIS — Z0000 Encounter for general adult medical examination without abnormal findings: Secondary | ICD-10-CM | POA: Diagnosis not present

## 2023-05-24 DIAGNOSIS — E785 Hyperlipidemia, unspecified: Secondary | ICD-10-CM | POA: Diagnosis not present

## 2023-05-24 DIAGNOSIS — E559 Vitamin D deficiency, unspecified: Secondary | ICD-10-CM | POA: Diagnosis not present

## 2023-05-24 LAB — LAB REPORT - SCANNED
A1c: 5.7
Creatinine, POC: 35 mg/dL
EGFR: 70

## 2023-05-25 ENCOUNTER — Encounter (HOSPITAL_BASED_OUTPATIENT_CLINIC_OR_DEPARTMENT_OTHER): Payer: Self-pay | Admitting: Cardiology

## 2023-06-03 DIAGNOSIS — U071 COVID-19: Secondary | ICD-10-CM | POA: Diagnosis not present

## 2023-07-02 DIAGNOSIS — Z79899 Other long term (current) drug therapy: Secondary | ICD-10-CM | POA: Diagnosis not present

## 2023-07-02 DIAGNOSIS — G4733 Obstructive sleep apnea (adult) (pediatric): Secondary | ICD-10-CM | POA: Diagnosis not present

## 2023-07-05 NOTE — Progress Notes (Deleted)
 Cardiology Office Note:  .   Date:  07/05/2023  ID:  Colette, Dicamillo 1955/06/03, MRN [id number] PCP: Gweneth Dimitri, MD  [facility] Providers Cardiologist:  Armanda Magic, MD { Click to update primary MD,subspecialty MD or APP then REFRESH:1}   History of Present Illness: .   Lauren Huffman is a 68 y.o. female  with a history of SVT,carpal tunnel, HTN, HLD, depression, anxiety, sleep apnea (unable to tolerate CPAP) & varicose veins s/p ablation. FH + for CAD.    She was in the ER October 2018 with symptomatic palpitations. Found to be in SVT - treated with Adenosine and converted - then started on CCB which she hasn't tolerated. She reported prior side effects with Metoprolol. She has not been able to tolerate CPAP. Echo was basically normal. She declined event monitor due to cost.     Echo 11/30/2019 normal LVEF 60 to 65% grade 1 DD.  Coronary CTA 12/04/2019 coronary calcium score 156 85th percentile for age and sex matched control moderate 50 to 69% proximal and greater than 70% mixed plaque in the LAD and diagonal.  Showed there is obstructive plaque in the mid diagonal branch but main epicardial arteries have normal flow.  Dr. Mayford Knife reviewed recommended aspirin 81 mg daily Imdur 30 mg daily and changed her to atorvastatin 40 mg daily.    ROS: ***  Studies Reviewed: Marland Kitchen         Prior CV Studies: {Select studies to display:26339}  Coronary CTA and FFR 9/2021IMPRESSION: 1. Coronary calcium score of 156. This was 85th percentile for age and sex matched control.   2. Normal coronary origin with right dominance.   3. Moderate (50-69%) proximal and severe (>70%) mixed plaque in the LAD and D1. CAD-RADS 4.   4.  Consider cardiac catheterization.   Chilton Si, MD     Electronically Signed   By: Chilton Si   On: 12/04/2019 17:39 FINDINGS: FFRct analysis was performed on the original cardiac CT angiogram dataset. Diagrammatic representation of the FFRct analysis  is provided in a separate PDF document in PACS. This dictation was created using the PDF document and an interactive 3D model of the results. 3D model is not available in the EMR/PACS. Normal FFR range is >0.80.   1. Left Main: No significant stenosis. LM FFR = 0.99.   2. LAD: No significant stenosis. Proximal FFR = 0.99, Mid FFR = 0.97, Distal FFR = 0.93.   3. LCX: No significant stenosis. Proximal FFR = 0.98, Mid FFR = 0.97, Distal FFR = 0.95   4. OM1: No significant stenosis. Proximal FFR = 0.98, Mid to distal FFR = 0.87.   5. RCA: No significant stenosis. Proximal FFR = 0.98, Mid FFR = 0.95, Distal FFR = 0.90.   IMPRESSION:: IMPRESSION: 1. Coronary CT FFR flow analysis demonstrates possible flow limiting lesion in the diagonal #1 branch (FFR 0.79 mid and 0.76 distal). Normal flow in RCA, LCX and LAD.   2.  Recommend medical management.   Traci Turner     Electronically Signed   By: Armanda Magic   On: 12/06/2019 12:46   2D echo 8/2021IMPRESSIONS     1. Left ventricular ejection fraction, by estimation, is 60 to 65%. The  left ventricle has normal function. The left ventricle has no regional  wall motion abnormalities. Left ventricular diastolic parameters are  consistent with Grade I diastolic  dysfunction (impaired relaxation). The average left ventricular global  longitudinal strain is -18.5 %.  The global longitudinal strain is normal.   2. Right ventricular systolic function is normal. The right ventricular  size is normal. There is normal pulmonary artery systolic pressure.   3. The mitral valve is normal in structure. No evidence of mitral valve  regurgitation. No evidence of mitral stenosis.   4. The aortic valve is normal in structure. Aortic valve regurgitation is  not visualized. No aortic stenosis is present.   5. The inferior vena cava is normal in size with greater than 50%  respiratory variability, suggesting right atrial pressure of 3 mmHg.    Comparison(s): 02/03/17 EF 55-60%.         Risk Assessment/Calculations:   {Does this patient have ATRIAL FIBRILLATION?:[id number]} No BP recorded.  {Refresh Note OR Click here to enter BP  :1}***       Physical Exam:   VS:  There were no vitals taken for this visit.   Wt Readings from Last 3 Encounters:  03/11/22 291 lb 6 oz (132.2 kg)  07/07/21 277 lb (125.6 kg)  06/16/21 270 lb (122.5 kg)    GEN: Well nourished, well developed in no acute distress NECK: No JVD; No carotid bruits CARDIAC: ***RRR, no murmurs, rubs, gallops RESPIRATORY:  Clear to auscultation without rales, wheezing or rhonchi  ABDOMEN: Soft, non-tender, non-distended EXTREMITIES:  No edema; No deformity   ASSESSMENT AND PLAN: .    ASCAD - Coronary CTA 12/04/2019 showed ronary calcium score 156,moderate 50 to 69% proximal and > 70% mixed plaque in the LAD and diagonal.   -FFR c/w obstructive plaque in the mid diagonal branch but main epicardial arteries had normal flow.with obstructive plaque in the mid diagonal branch on coronary CTA and FFR  -Medical management recommended  -She has not had any anginal chest pain  -Continue on aspirin, Imdur 30mg  daily and atorvastatin.   -LDL 103 06/2021 will repeat and most likely add zetia   HTN  -BP now controlled, didn't tolerate amlodipine-swelling   SVT -She was intolerant to metoprolol and Cardizem -She has not had any recent palpitations   History of OSA  -could not tolerate CPAP   Hyperlipidemia  -LDL goal less than 70  -LDL 103 06/2021. Will repeat FLP and if still up will add zetia 10 mg daily   Obesity  -going to weight loss center.  Just started ozempic -Recommend 150 minutes of exercise weekly.     {Are you ordering a CV Procedure (e.g. stress test, cath, DCCV, TEE, etc)?   Press F2        :[id number]}  Dispo: ***  Signed, Jacolyn Reedy, PA-C

## 2023-07-09 ENCOUNTER — Other Ambulatory Visit: Payer: Self-pay | Admitting: Cardiology

## 2023-07-13 ENCOUNTER — Ambulatory Visit: Payer: Medicare Other | Admitting: Physician Assistant

## 2023-07-14 ENCOUNTER — Encounter: Payer: Self-pay | Admitting: Family Medicine

## 2023-07-29 DIAGNOSIS — I25118 Atherosclerotic heart disease of native coronary artery with other forms of angina pectoris: Secondary | ICD-10-CM | POA: Diagnosis not present

## 2023-07-29 DIAGNOSIS — G4733 Obstructive sleep apnea (adult) (pediatric): Secondary | ICD-10-CM | POA: Diagnosis not present

## 2023-07-29 DIAGNOSIS — R7303 Prediabetes: Secondary | ICD-10-CM | POA: Diagnosis not present

## 2023-08-18 DIAGNOSIS — Z79899 Other long term (current) drug therapy: Secondary | ICD-10-CM | POA: Diagnosis not present

## 2023-08-18 DIAGNOSIS — E559 Vitamin D deficiency, unspecified: Secondary | ICD-10-CM | POA: Diagnosis not present

## 2023-08-23 DIAGNOSIS — R7303 Prediabetes: Secondary | ICD-10-CM | POA: Diagnosis not present

## 2023-08-23 DIAGNOSIS — R600 Localized edema: Secondary | ICD-10-CM | POA: Diagnosis not present

## 2023-08-23 DIAGNOSIS — I25118 Atherosclerotic heart disease of native coronary artery with other forms of angina pectoris: Secondary | ICD-10-CM | POA: Diagnosis not present

## 2023-09-03 ENCOUNTER — Ambulatory Visit: Admitting: Cardiology

## 2023-09-10 DIAGNOSIS — R059 Cough, unspecified: Secondary | ICD-10-CM | POA: Diagnosis not present

## 2023-09-20 DIAGNOSIS — E559 Vitamin D deficiency, unspecified: Secondary | ICD-10-CM | POA: Diagnosis not present

## 2023-09-20 DIAGNOSIS — K219 Gastro-esophageal reflux disease without esophagitis: Secondary | ICD-10-CM | POA: Diagnosis not present

## 2023-10-11 DIAGNOSIS — R350 Frequency of micturition: Secondary | ICD-10-CM | POA: Diagnosis not present

## 2023-10-14 ENCOUNTER — Ambulatory Visit: Admitting: Cardiology

## 2023-11-30 ENCOUNTER — Ambulatory Visit: Admitting: Cardiology

## 2023-12-10 ENCOUNTER — Encounter (HOSPITAL_COMMUNITY): Payer: Self-pay

## 2023-12-10 ENCOUNTER — Inpatient Hospital Stay (HOSPITAL_COMMUNITY)
Admission: EM | Admit: 2023-12-10 | Discharge: 2023-12-12 | DRG: 193 | Disposition: A | Discharge status: Home / Self Care | Attending: Internal Medicine | Admitting: Internal Medicine

## 2023-12-10 ENCOUNTER — Emergency Department (HOSPITAL_COMMUNITY)

## 2023-12-10 ENCOUNTER — Other Ambulatory Visit: Payer: Self-pay

## 2023-12-10 DIAGNOSIS — G4733 Obstructive sleep apnea (adult) (pediatric): Secondary | ICD-10-CM | POA: Diagnosis present

## 2023-12-10 DIAGNOSIS — Z885 Allergy status to narcotic agent status: Secondary | ICD-10-CM

## 2023-12-10 DIAGNOSIS — Z6841 Body Mass Index (BMI) 40.0 and over, adult: Secondary | ICD-10-CM

## 2023-12-10 DIAGNOSIS — I2510 Atherosclerotic heart disease of native coronary artery without angina pectoris: Secondary | ICD-10-CM | POA: Diagnosis present

## 2023-12-10 DIAGNOSIS — Z882 Allergy status to sulfonamides status: Secondary | ICD-10-CM

## 2023-12-10 DIAGNOSIS — E860 Dehydration: Secondary | ICD-10-CM | POA: Diagnosis not present

## 2023-12-10 DIAGNOSIS — Z7985 Long-term (current) use of injectable non-insulin antidiabetic drugs: Secondary | ICD-10-CM | POA: Diagnosis not present

## 2023-12-10 DIAGNOSIS — Z96641 Presence of right artificial hip joint: Secondary | ICD-10-CM | POA: Diagnosis present

## 2023-12-10 DIAGNOSIS — I471 Supraventricular tachycardia, unspecified: Secondary | ICD-10-CM | POA: Diagnosis present

## 2023-12-10 DIAGNOSIS — K219 Gastro-esophageal reflux disease without esophagitis: Secondary | ICD-10-CM | POA: Diagnosis not present

## 2023-12-10 DIAGNOSIS — R7303 Prediabetes: Secondary | ICD-10-CM | POA: Diagnosis not present

## 2023-12-10 DIAGNOSIS — F3181 Bipolar II disorder: Secondary | ICD-10-CM | POA: Diagnosis present

## 2023-12-10 DIAGNOSIS — E785 Hyperlipidemia, unspecified: Secondary | ICD-10-CM | POA: Diagnosis not present

## 2023-12-10 DIAGNOSIS — J159 Unspecified bacterial pneumonia: Secondary | ICD-10-CM | POA: Diagnosis not present

## 2023-12-10 DIAGNOSIS — R231 Pallor: Secondary | ICD-10-CM | POA: Diagnosis not present

## 2023-12-10 DIAGNOSIS — R55 Syncope and collapse: Secondary | ICD-10-CM | POA: Diagnosis not present

## 2023-12-10 DIAGNOSIS — R0902 Hypoxemia: Secondary | ICD-10-CM | POA: Diagnosis not present

## 2023-12-10 DIAGNOSIS — Z8249 Family history of ischemic heart disease and other diseases of the circulatory system: Secondary | ICD-10-CM | POA: Diagnosis not present

## 2023-12-10 DIAGNOSIS — E559 Vitamin D deficiency, unspecified: Secondary | ICD-10-CM | POA: Diagnosis present

## 2023-12-10 DIAGNOSIS — Z1152 Encounter for screening for COVID-19: Secondary | ICD-10-CM

## 2023-12-10 DIAGNOSIS — R911 Solitary pulmonary nodule: Secondary | ICD-10-CM

## 2023-12-10 DIAGNOSIS — Z7982 Long term (current) use of aspirin: Secondary | ICD-10-CM

## 2023-12-10 DIAGNOSIS — J189 Pneumonia, unspecified organism: Secondary | ICD-10-CM

## 2023-12-10 DIAGNOSIS — J9601 Acute respiratory failure with hypoxia: Principal | ICD-10-CM | POA: Diagnosis present

## 2023-12-10 DIAGNOSIS — Z83438 Family history of other disorder of lipoprotein metabolism and other lipidemia: Secondary | ICD-10-CM

## 2023-12-10 DIAGNOSIS — E861 Hypovolemia: Secondary | ICD-10-CM | POA: Diagnosis not present

## 2023-12-10 DIAGNOSIS — Z79899 Other long term (current) drug therapy: Secondary | ICD-10-CM | POA: Diagnosis not present

## 2023-12-10 DIAGNOSIS — Z888 Allergy status to other drugs, medicaments and biological substances status: Secondary | ICD-10-CM

## 2023-12-10 DIAGNOSIS — Z833 Family history of diabetes mellitus: Secondary | ICD-10-CM | POA: Diagnosis not present

## 2023-12-10 DIAGNOSIS — I872 Venous insufficiency (chronic) (peripheral): Secondary | ICD-10-CM | POA: Diagnosis present

## 2023-12-10 DIAGNOSIS — R0602 Shortness of breath: Secondary | ICD-10-CM | POA: Diagnosis not present

## 2023-12-10 DIAGNOSIS — R000 Tachycardia, unspecified: Secondary | ICD-10-CM | POA: Diagnosis not present

## 2023-12-10 DIAGNOSIS — G8929 Other chronic pain: Secondary | ICD-10-CM | POA: Diagnosis present

## 2023-12-10 DIAGNOSIS — R42 Dizziness and giddiness: Secondary | ICD-10-CM | POA: Diagnosis not present

## 2023-12-10 DIAGNOSIS — F419 Anxiety disorder, unspecified: Secondary | ICD-10-CM | POA: Diagnosis present

## 2023-12-10 DIAGNOSIS — E66813 Obesity, class 3: Secondary | ICD-10-CM | POA: Diagnosis present

## 2023-12-10 DIAGNOSIS — Z96653 Presence of artificial knee joint, bilateral: Secondary | ICD-10-CM | POA: Diagnosis present

## 2023-12-10 DIAGNOSIS — K449 Diaphragmatic hernia without obstruction or gangrene: Secondary | ICD-10-CM | POA: Diagnosis not present

## 2023-12-10 DIAGNOSIS — I10 Essential (primary) hypertension: Secondary | ICD-10-CM | POA: Diagnosis present

## 2023-12-10 DIAGNOSIS — R918 Other nonspecific abnormal finding of lung field: Secondary | ICD-10-CM | POA: Diagnosis not present

## 2023-12-10 LAB — RESP PANEL BY RT-PCR (RSV, FLU A&B, COVID)  RVPGX2
Influenza A by PCR: NEGATIVE
Influenza B by PCR: NEGATIVE
Resp Syncytial Virus by PCR: NEGATIVE
SARS Coronavirus 2 by RT PCR: NEGATIVE

## 2023-12-10 LAB — COMPREHENSIVE METABOLIC PANEL WITH GFR
ALT: 19 U/L (ref 0–44)
AST: 22 U/L (ref 15–41)
Albumin: 3.1 g/dL — ABNORMAL LOW (ref 3.5–5.0)
Alkaline Phosphatase: 59 U/L (ref 38–126)
Anion gap: 15 (ref 5–15)
BUN: 14 mg/dL (ref 8–23)
CO2: 22 mmol/L (ref 22–32)
Calcium: 8.3 mg/dL — ABNORMAL LOW (ref 8.9–10.3)
Chloride: 108 mmol/L (ref 98–111)
Creatinine, Ser: 0.99 mg/dL (ref 0.44–1.00)
GFR, Estimated: >60 mL/min (ref >60)
Glucose, Bld: 75 mg/dL (ref 70–99)
Potassium: 4 mmol/L (ref 3.5–5.1)
Sodium: 145 mmol/L (ref 135–145)
Total Bilirubin: 0.7 mg/dL (ref 0.0–1.2)
Total Protein: 5.2 g/dL — ABNORMAL LOW (ref 6.5–8.1)

## 2023-12-10 LAB — URINALYSIS, ROUTINE W REFLEX MICROSCOPIC
Bilirubin Urine: NEGATIVE
Glucose, UA: NEGATIVE mg/dL
Hgb urine dipstick: NEGATIVE
Ketones, ur: NEGATIVE mg/dL
Nitrite: NEGATIVE
Protein, ur: NEGATIVE mg/dL
Specific Gravity, Urine: 1.006 (ref 1.005–1.030)
pH: 7 (ref 5.0–8.0)

## 2023-12-10 LAB — CBC WITH DIFFERENTIAL/PLATELET
Abs Immature Granulocytes: 0.04 K/uL (ref 0.00–0.07)
Basophils Absolute: 0.1 K/uL (ref 0.0–0.1)
Basophils Relative: 1 %
Eosinophils Absolute: 0.2 K/uL (ref 0.0–0.5)
Eosinophils Relative: 2 %
HCT: 46.2 % — ABNORMAL HIGH (ref 36.0–46.0)
Hemoglobin: 14.7 g/dL (ref 12.0–15.0)
Immature Granulocytes: 0 %
Lymphocytes Relative: 29 %
Lymphs Abs: 2.7 K/uL (ref 0.7–4.0)
MCH: 30 pg (ref 26.0–34.0)
MCHC: 31.8 g/dL (ref 30.0–36.0)
MCV: 94.3 fL (ref 80.0–100.0)
Monocytes Absolute: 1.2 K/uL — ABNORMAL HIGH (ref 0.1–1.0)
Monocytes Relative: 13 %
Neutro Abs: 5.1 K/uL (ref 1.7–7.7)
Neutrophils Relative %: 55 %
Platelets: 240 K/uL (ref 150–400)
RBC: 4.9 MIL/uL (ref 3.87–5.11)
RDW: 14.2 % (ref 11.5–15.5)
WBC: 9.3 K/uL (ref 4.0–10.5)
nRBC: 0 % (ref 0.0–0.2)

## 2023-12-10 LAB — MAGNESIUM: Magnesium: 2 mg/dL (ref 1.7–2.4)

## 2023-12-10 LAB — TROPONIN I (HIGH SENSITIVITY)
Troponin I (High Sensitivity): 7 ng/L (ref <18)
Troponin I (High Sensitivity): 9 ng/L (ref <18)

## 2023-12-10 LAB — T4, FREE: Free T4: 0.99 ng/dL (ref 0.61–1.12)

## 2023-12-10 LAB — TSH: TSH: 4.386 u[IU]/mL (ref 0.350–4.500)

## 2023-12-10 MED ORDER — SODIUM CHLORIDE 0.9 % IV SOLN
1.0000 g | Freq: Once | INTRAVENOUS | Status: AC
  Administered 2023-12-10: 1 g via INTRAVENOUS
  Filled 2023-12-10: qty 10

## 2023-12-10 MED ORDER — SODIUM CHLORIDE 0.9 % IV SOLN
500.0000 mg | Freq: Once | INTRAVENOUS | Status: AC
  Administered 2023-12-10: 500 mg via INTRAVENOUS
  Filled 2023-12-10: qty 5

## 2023-12-10 MED ORDER — IOHEXOL 350 MG/ML SOLN
75.0000 mL | Freq: Once | INTRAVENOUS | Status: AC | PRN
  Administered 2023-12-10: 75 mL via INTRAVENOUS

## 2023-12-10 MED ORDER — SODIUM CHLORIDE 0.9 % IV BOLUS
1000.0000 mL | Freq: Once | INTRAVENOUS | Status: AC
  Administered 2023-12-10: 1000 mL via INTRAVENOUS

## 2023-12-10 NOTE — ED Provider Notes (Signed)
 [facility] AT [facility] Provider Note  CSN: [id number] Arrival date & time: 12/10/23 1653  Chief Complaint(s) No chief complaint on file.  HPI Lauren Huffman is a 68 y.o. female with past medical history as below, significant for bipolar 2 disorder, CAD, hyperlipidemia, hypertension, SVT who presents to the ED with complaint of elevated heart rate, lightheaded  Patient reports since around 1:00 today she has been feeling lightheaded, feeling overly hot.  Symptoms provoked by ambulation or standing, improved at rest.  Sensation of feeling lightheaded, no chest pain or palpitations, no dyspnea.  No syncope or near syncope.  No nausea or vomiting or belly pain, change in bowel or bladder function.  She has not had episode of SVT in many years and patient reports her symptoms today do not feel similar to prior bouts of SVT.  She was given IV fluids by EMS and heart rate did somewhat improved.  She is not symptomatic while sitting in the room but when she attempts to get up and move around she does feel lightheaded.  No chest pain or palpitations.  Past Medical History Past Medical History:  Diagnosis Date   Aneurysm, lower extremity (HCC)    Anxiety    Arthritis    Bipolar 2 disorder (HCC)    CAD (coronary artery disease)    a. by Cor CTA 11/2019.   Carpal tunnel syndrome    Right   Chronic pain    Depression    GERD (gastroesophageal reflux disease)    Headache(784.0)    Hiatal hernia    History of colon polyps    History of iron deficiency    Hyperlipemia    Hypertension    Joint pain    Lactose intolerance    Left arm pain    Lower extremity edema    Memory difficulty    Mental disorder    Obesity    OSA (obstructive sleep apnea)    Osteoarthritis    Prediabetes    Pulmonary nodules    Sleep apnea    DOES NOT WEAR CPAP     Sleep disturbance    SOB (shortness of breath)    Status post ablation of incompetent vein using laser 2x in Nov. 2017    right   SVT (supraventricular tachycardia) (HCC)    TMJ (dislocation of temporomandibular joint)    Varicose veins    Vitamin D  insufficiency    Patient Active Problem List   Diagnosis Date Noted   Prediabetes 05/13/2021   Insomnia 03/14/2018   Memory loss 01/07/2018   Primary osteoarthritis of right hip 03/27/2016   CTS (carpal tunnel syndrome) 09/17/2015   Left elbow pain 06/20/2015   Paresthesia 06/20/2015   Osteoarthritis of left knee 07/08/2012   Obesity 12/28/2011   Osteoarthritis of right knee 12/25/2011   Home Medication(s) Prior to Admission medications   Medication Sig Start Date End Date Taking? Authorizing Provider  aspirin  EC 81 MG tablet Take 1 tablet (81 mg total) by mouth daily. Swallow whole. 12/06/19   Shlomo Wilbert SAUNDERS, MD  atorvastatin  (LIPITOR) 80 MG tablet Take 1 tablet (80 mg total) by mouth daily. 01/21/21   Shlomo Wilbert SAUNDERS, MD  buPROPion  (WELLBUTRIN  XL) 150 MG 24 hr tablet Take 300 mg by mouth daily.     [provider]  Calcium -Phosphorus-Vitamin D  (CALCIUM  GUMMIES PO) Take 500 mg by mouth.    [provider]  Cyanocobalamin  3000 MCG CAPS Take 3,000 capsules by mouth once  a week. 02/04/21   Prentiss Frieze, DO  divalproex (DEPAKOTE ER) 500 MG 24 hr tablet 1/2 tab AM,- 1 tab PM 05/11/18   [provider]  ELDERBERRY PO Take by mouth daily.    [provider]  folic acid  (FOLVITE ) 1 MG tablet Take 2 mg by mouth daily.     [provider]  isosorbide  mononitrate (IMDUR ) 30 MG 24 hr tablet Take 1 tablet (30 mg total) by mouth daily. 07/09/23   Shlomo Wilbert SAUNDERS, MD  loratadine  (CLARITIN ) 10 MG tablet Take 10 mg by mouth daily.    [provider]  Multiple Vitamins-Minerals (MULTIVITAMIN GUMMIES ADULT PO) Take 1 tablet by mouth daily.     [provider]  omeprazole (PRILOSEC) 40 MG capsule Take 40 mg by mouth 2 (two) times daily.  02/26/15   [provider]  Probiotic Product (ALIGN) CHEW Chew 1 tablet  by mouth daily.    [provider]  risperiDONE  (RISPERDAL ) 0.5 MG tablet Take 0.5 mg by mouth at bedtime.    [provider]  Semaglutide,0.25 or 0.5MG /DOS, (OZEMPIC, 0.25 OR 0.5 MG/DOSE,) 2 MG/1.5ML SOPN  02/23/22   [provider]  TURMERIC PO Take 1,075 mg by mouth daily.    [provider]  vitamin C  (ASCORBIC ACID ) 500 MG tablet Take 500 mg by mouth daily.    [provider]  Vitamin D , Ergocalciferol , (DRISDOL ) 1.25 MG (50000 UNIT) CAPS capsule Take 1 capsule (50,000 Units total) by mouth every 7 (seven) days. 02/25/21   Prentiss Frieze, DO                                                                                                                                    Past Surgical History Past Surgical History:  Procedure Laterality Date   CYST REMOVAL HAND Left    JOINT REPLACEMENT Right    knee   KNEE ARTHROPLASTY  12/25/2011   Procedure: COMPUTER ASSISTED TOTAL KNEE ARTHROPLASTY;  Surgeon: Norleen LITTIE Gavel, MD;  Location: [facility] OR;  Service: Orthopedics;  Laterality: Right;  Right total knee replacement, general anesthesia, femoral nerve block   KNEE ARTHROPLASTY Left 07/08/2012   Procedure: COMPUTER ASSISTED TOTAL KNEE ARTHROPLASTY;  Surgeon: Norleen LITTIE Gavel, MD;  Location: [facility] OR;  Service: Orthopedics;  Laterality: Left;  PRE OP FEMORAL NERVE BLOCK   lazer vein surgery  2017   right leg   TOTAL HIP ARTHROPLASTY Right 03/27/2016   TOTAL HIP ARTHROPLASTY Right 03/27/2016   Procedure: TOTAL HIP ARTHROPLASTY ANTERIOR APPROACH;  Surgeon: Norleen Gavel, MD;  Location: [facility] OR;  Service: Orthopedics;  Laterality: Right;   TOTAL KNEE ARTHROPLASTY Left 07/08/2012   Dr Gavel   Family History Family History  Problem Relation Age of Onset   Stroke Mother    Seizures Mother    Memory loss Mother    CAD Mother        had a  stent   Hyperlipidemia Mother    Heart disease Mother    Thyroid  disease Mother    Cancer Father        Salivary Gland    Heart  disease Father        2 silent MI's   Diabetes Father    Supraventricular tachycardia Sister    Hyperlipidemia Sister     Social History Social History   Tobacco Use   Smoking status: Never   Smokeless tobacco: Never  Vaping Use   Vaping status: Never Used  Substance Use Topics   Alcohol use: Not Currently   Drug use: No   Allergies Cartia  xt [diltiazem  hcl er beads], Gabapentin , Metoprolol , Sulfa antibiotics, and Trazodone   Review of Systems A thorough review of systems was obtained and all systems are negative except as noted in the HPI and PMH.   Physical Exam Vital Signs  I have reviewed the triage vital signs BP (!) 143/91   Pulse (!) 107   Temp 98.1 F (36.7 C) (Oral)   Resp 13   Ht 5' 8 (1.727 m)   Wt 121.1 kg   SpO2 94%   BMI 40.60 kg/m  Physical Exam Vitals and nursing note reviewed.  Constitutional:      General: She is not in acute distress.    Appearance: Normal appearance.  HENT:     Head: Normocephalic and atraumatic.     Right Ear: External ear normal.     Left Ear: External ear normal.     Nose: Nose normal.     Mouth/Throat:     Mouth: Mucous membranes are moist.  Eyes:     General: No scleral icterus.       Right eye: No discharge.        Left eye: No discharge.     Extraocular Movements: Extraocular movements intact.     Pupils: Pupils are equal, round, and reactive to light.  Cardiovascular:     Rate and Rhythm: Regular rhythm. Tachycardia present.     Pulses: Normal pulses.     Heart sounds: Normal heart sounds.  Pulmonary:     Effort: Pulmonary effort is normal. No respiratory distress.     Breath sounds: Normal breath sounds. No stridor.  Abdominal:     General: Abdomen is flat. There is no distension.     Palpations: Abdomen is soft.     Tenderness: There is no abdominal tenderness.  Musculoskeletal:     Cervical back: No rigidity.     Right lower leg: No edema.     Left lower leg: No edema.  Skin:    General: Skin is  warm and dry.     Capillary Refill: Capillary refill takes less than 2 seconds.  Neurological:     Mental Status: She is alert and oriented to person, place, and time.     GCS: GCS eye subscore is 4. GCS verbal subscore is 5. GCS motor subscore is 6.     Cranial Nerves: Cranial nerves 2-12 are intact.     Sensory: Sensation is intact.     Motor: Motor function is intact.     Coordination: Coordination is intact.     Comments: Strength 5/5 to BLUE/BLLE, equal and symmetric    Psychiatric:        Mood and Affect: Mood normal.        Behavior: Behavior normal. Behavior is cooperative.     ED Results and Treatments Labs (all labs ordered are  listed, but only abnormal results are displayed) Labs Reviewed  COMPREHENSIVE METABOLIC PANEL WITH GFR - Abnormal; Notable for the following components:      Result Value   Calcium  8.3 (*)    Total Protein 5.2 (*)    Albumin 3.1 (*)    All other components within normal limits  CBC WITH DIFFERENTIAL/PLATELET - Abnormal; Notable for the following components:   HCT 46.2 (*)    Monocytes Absolute 1.2 (*)    All other components within normal limits  URINALYSIS, ROUTINE W REFLEX MICROSCOPIC - Abnormal; Notable for the following components:   Leukocytes,Ua SMALL (*)    Bacteria, UA RARE (*)    All other components within normal limits  RESP PANEL BY RT-PCR (RSV, FLU A&B, COVID)  RVPGX2  TSH  T4, FREE  MAGNESIUM   T3, FREE  TROPONIN I (HIGH SENSITIVITY)  TROPONIN I (HIGH SENSITIVITY)                                                                                                                          Radiology CT Angio Chest PE W and/or Wo Contrast Result Date: 12/10/2023 EXAM: CTA of the Chest with contrast for PE 12/10/2023 08:23:54 PM TECHNIQUE: CTA of the chest was performed after the administration of intravenous contrast. Multiplanar reformatted images are provided for review. MIP images are provided for review. Automated exposure  control, iterative reconstruction, and/or weight based adjustment of the mA/kV was utilized to reduce the radiation dose to as low as reasonably achievable. 75mL of iohexol  (OMNIPAQUE ) 350 MG/ML injection was used. COMPARISON: None available. CLINICAL HISTORY: Pulmonary embolism (PE) suspected, high probability, near syncope and tachycardia. Ran errands in car without A/C. Pt felt she got too hot and had too much caffeine today. Pale and clammy per EMS. HR 140 initially, BP 100 palpated. FINDINGS: PULMONARY ARTERIES: Pulmonary arteries are adequately opacified for evaluation. No pulmonary embolism. MEDIASTINUM: The heart and pericardium demonstrate no acute abnormality. There is no acute abnormality of the thoracic aorta. LYMPH NODES: No mediastinal, hilar or axillary lymphadenopathy. LUNGS AND PLEURA: The lungs are without acute process. A linear ground glass opacity in the right upper lobe is favored to represent scarring. An 8 mm groundglass nodule in the right upper lobe (series 6, image 48) is noted. This is stable compared to CT December 04, 2019. According to the Fleischner Society pulmonary nodule recommendations follow-up should be continued until 5 years of stability as documented. An additional stable 4 mm subpleural nodule in the left lower lobe is seen on series 6, image 87. Patchy ground glass opacities in the subpleural lingula are also present. Chronic scarring or atelectasis in the right lower lobe is noted. No pleural effusion or pneumothorax. UPPER ABDOMEN: Moderate hiatal hernia is present. SOFT TISSUES AND BONES: No acute bone or soft tissue abnormality. IMPRESSION: 1. No pulmonary embolism. 2. Scattered ground-glass opacities are favored to be infectious or inflammatory. 3. 8 mm ground-glass nodule in the right upper lobe.  Per Fleischner Society guidelines, follow-up every 2 years until 5 years of stability has been documented is recommended. 4. Moderate hiatal hernia. Electronically signed by:  Norman Gatlin MD 12/10/2023 08:39 PM EDT RP Workstation: [id number]   DG Chest Port 1 View Result Date: 12/10/2023 CLINICAL DATA:  Near syncope, tachycardia EXAM: PORTABLE CHEST 1 VIEW COMPARISON:  07/28/2019 FINDINGS: Single frontal view of the chest demonstrates a stable cardiac silhouette. No airspace disease, effusion, or pneumothorax. No acute fracture. IMPRESSION: 1. No acute intrathoracic process. Electronically Signed   By: Ozell Daring M.D.   On: 12/10/2023 17:40    Pertinent labs & imaging results that were available during my care of the patient were reviewed by me and considered in my medical decision making (see MDM for details).  Medications Ordered in ED Medications  azithromycin  (ZITHROMAX ) 500 mg in sodium chloride  0.9 % 250 mL IVPB (500 mg Intravenous New Bag/Given 12/10/23 2223)  sodium chloride  0.9 % bolus 1,000 mL (0 mLs Intravenous Stopped 12/10/23 1824)  iohexol  (OMNIPAQUE ) 350 MG/ML injection 75 mL (75 mLs Intravenous Contrast Given 12/10/23 2025)  cefTRIAXone  (ROCEPHIN ) 1 g in sodium chloride  0.9 % 100 mL IVPB (1 g Intravenous New Bag/Given 12/10/23 2201)                                                                                                                                     Procedures .Critical Care  Performed by: Elnor Jayson LABOR, DO Authorized by: Elnor Jayson LABOR, DO   Critical care provider statement:    Critical care time (minutes):  30   Critical care time was exclusive of:  Separately billable procedures and treating other patients   Critical care was necessary to treat or prevent imminent or life-threatening deterioration of the following conditions:  Respiratory failure   Critical care was time spent personally by me on the following activities:  Development of treatment plan with patient or surrogate, discussions with consultants, evaluation of patient's response to treatment, examination of patient, ordering and review of laboratory studies, ordering and  review of radiographic studies, ordering and performing treatments and interventions, pulse oximetry, re-evaluation of patient's condition, review of old charts and obtaining history from patient or surrogate   Care discussed with: admitting provider     (including critical care time)  Medical Decision Making / ED Course    Medical Decision Making:    ZARA WENDT is a 68 y.o. female with past medical history as below, significant for bipolar 2 disorder, CAD, hyperlipidemia, hypertension, SVT who presents to the ED with complaint of elevated heart rate, lightheaded. The complaint involves an extensive differential diagnosis and also carries with it a high risk of complications and morbidity.  Serious etiology was considered. Ddx includes but is not limited to: SVT, A-flutter, sinus tachycardia, thyroid  disturbance, VTE, infection, dehydration, heat exposure, ACS, etc.  Complete initial physical exam performed, notably the patient was in  NAD, tachycardia noted on telemetry, bp stable.    Reviewed and confirmed nursing documentation for past medical history, family history, social history.  Vital signs reviewed.      Hypoxia CAP > - She has history of SVT, has not had an incidence for some time now.  Likely SVT noted on EKG, did improve with fluids.  She has no palpitations or chest pain - Hypoxia on room air, worse with exertion, is not typically wear home oxygen. Light headed w/ exertion, no exertional CP - Likely pneumonia on imaging, she does not appear to be septic, no leukocytosis or fever.  Start Rocephin /azithromycin  - Unable to titrate off oxygen, ambu pulse ox with desaturation to 88%, increased work of breathing >> continue 2LNC - Heart rate has improved with fluids, troponin negative  SVT > - hx in the past, no meds, improved w/ fluids - f/w dr shlomo cardiology     Patient with pneumonia, new oxygen requirement, recommend admission, she is agreeable.  Admit hospitalist  Dr  Keturah                Additional history obtained: -Additional history obtained from family -External records from outside source obtained and reviewed including: Chart review including previous notes, labs, imaging, consultation notes including  Primary care documentation, cardiology    Lab Tests: -I ordered, reviewed, and interpreted labs.   The pertinent results include:   Labs Reviewed  COMPREHENSIVE METABOLIC PANEL WITH GFR - Abnormal; Notable for the following components:      Result Value   Calcium  8.3 (*)    Total Protein 5.2 (*)    Albumin 3.1 (*)    All other components within normal limits  CBC WITH DIFFERENTIAL/PLATELET - Abnormal; Notable for the following components:   HCT 46.2 (*)    Monocytes Absolute 1.2 (*)    All other components within normal limits  URINALYSIS, ROUTINE W REFLEX MICROSCOPIC - Abnormal; Notable for the following components:   Leukocytes,Ua SMALL (*)    Bacteria, UA RARE (*)    All other components within normal limits  RESP PANEL BY RT-PCR (RSV, FLU A&B, COVID)  RVPGX2  TSH  T4, FREE  MAGNESIUM   T3, FREE  TROPONIN I (HIGH SENSITIVITY)  TROPONIN I (HIGH SENSITIVITY)    Notable for as above   EKG   EKG Interpretation Date/Time:  Friday December 10 2023 18:34:02 EDT Ventricular Rate:  107 PR Interval:  191 QRS Duration:  111 QT Interval:  362 QTC Calculation: 483 R Axis:   210  Text Interpretation: Sinus tachycardia Low voltage, precordial leads Probable right ventricular hypertrophy Borderline T abnormalities, anterior leads Interpretation limited secondary to artifact Confirmed by Elnor Savant ([id number]) on 12/10/2023 7:46:05 PM         Imaging Studies ordered: I ordered imaging studies including CTPE CXR I independently visualized the following imaging with scope of interpretation limited to determining acute life threatening conditions related to emergency care; findings noted above I agree with the radiologist  interpretation If any imaging was obtained with contrast I closely monitored patient for any possible adverse reaction a/w contrast administration in the emergency department   Medicines ordered and prescription drug management: Meds ordered this encounter  Medications   sodium chloride  0.9 % bolus 1,000 mL   iohexol  (OMNIPAQUE ) 350 MG/ML injection 75 mL   cefTRIAXone  (ROCEPHIN ) 1 g in sodium chloride  0.9 % 100 mL IVPB    Antibiotic Indication::   CAP   azithromycin  (ZITHROMAX ) 500 mg in  sodium chloride  0.9 % 250 mL IVPB    Antibiotic Indication::   CAP    -I have reviewed the patients home medicines and have made adjustments as needed   Consultations Obtained: I requested consultation with the hospitalist,  and discussed lab and imaging findings as well as pertinent plan   Cardiac Monitoring: The patient was maintained on a cardiac monitor.  I personally viewed and interpreted the cardiac monitored which showed an underlying rhythm of: SVT > sinus tachy > improved Continuous pulse oximetry interpreted by myself, 88% on RA   Social Determinants of Health:  Diagnosis or treatment significantly limited by social determinants of health: obesity   Reevaluation: After the interventions noted above, I reevaluated the patient and found that they have improved  Co morbidities that complicate the patient evaluation  Past Medical History:  Diagnosis Date   Aneurysm, lower extremity (HCC)    Anxiety    Arthritis    Bipolar 2 disorder (HCC)    CAD (coronary artery disease)    a. by Cor CTA 11/2019.   Carpal tunnel syndrome    Right   Chronic pain    Depression    GERD (gastroesophageal reflux disease)    Headache(784.0)    Hiatal hernia    History of colon polyps    History of iron deficiency    Hyperlipemia    Hypertension    Joint pain    Lactose intolerance    Left arm pain    Lower extremity edema    Memory difficulty    Mental disorder    Obesity    OSA  (obstructive sleep apnea)    Osteoarthritis    Prediabetes    Pulmonary nodules    Sleep apnea    DOES NOT WEAR CPAP     Sleep disturbance    SOB (shortness of breath)    Status post ablation of incompetent vein using laser 2x in Nov. 2017   right   SVT (supraventricular tachycardia) (HCC)    TMJ (dislocation of temporomandibular joint)    Varicose veins    Vitamin D  insufficiency       Dispostion: Disposition decision including need for hospitalization was considered, and patient admitted to the hospital.    Final Clinical Impression(s) / ED Diagnoses Final diagnoses:  SVT (supraventricular tachycardia) (HCC)  Community acquired pneumonia, unspecified laterality  Hypoxia  Pulmonary nodule        Elnor Jayson LABOR, DO 12/10/23 2242

## 2023-12-10 NOTE — ED Triage Notes (Signed)
 Pt BIB [facility] from home for near syncope and tachycardia. Ran errands in car without A/C. Pt felt she got too hot and had too much caffeine today. Pale and clammy per EMS. HR 140 initially, BP 100 palpated. 1000 mL NS, latest EMS BP 144/94, HR down to 130. Vagal unsuccessful. 12 lead unremarkable. 2 L [place] for comfort. Took of 243 mg of Aspirin  at home.   18 G R AC

## 2023-12-10 NOTE — ED Notes (Signed)
 Pt ambulated, o2 stat ranged from 90-88%, hr range from 110-120 bpm

## 2023-12-11 DIAGNOSIS — E785 Hyperlipidemia, unspecified: Secondary | ICD-10-CM | POA: Diagnosis present

## 2023-12-11 DIAGNOSIS — R7303 Prediabetes: Secondary | ICD-10-CM | POA: Diagnosis present

## 2023-12-11 DIAGNOSIS — Z833 Family history of diabetes mellitus: Secondary | ICD-10-CM | POA: Diagnosis not present

## 2023-12-11 DIAGNOSIS — G4733 Obstructive sleep apnea (adult) (pediatric): Secondary | ICD-10-CM | POA: Diagnosis present

## 2023-12-11 DIAGNOSIS — I2510 Atherosclerotic heart disease of native coronary artery without angina pectoris: Secondary | ICD-10-CM | POA: Diagnosis present

## 2023-12-11 DIAGNOSIS — Z6841 Body Mass Index (BMI) 40.0 and over, adult: Secondary | ICD-10-CM | POA: Diagnosis not present

## 2023-12-11 DIAGNOSIS — I872 Venous insufficiency (chronic) (peripheral): Secondary | ICD-10-CM | POA: Diagnosis present

## 2023-12-11 DIAGNOSIS — I10 Essential (primary) hypertension: Secondary | ICD-10-CM | POA: Diagnosis present

## 2023-12-11 DIAGNOSIS — E860 Dehydration: Secondary | ICD-10-CM | POA: Diagnosis present

## 2023-12-11 DIAGNOSIS — F419 Anxiety disorder, unspecified: Secondary | ICD-10-CM | POA: Diagnosis present

## 2023-12-11 DIAGNOSIS — I471 Supraventricular tachycardia, unspecified: Secondary | ICD-10-CM | POA: Diagnosis present

## 2023-12-11 DIAGNOSIS — R918 Other nonspecific abnormal finding of lung field: Secondary | ICD-10-CM | POA: Diagnosis present

## 2023-12-11 DIAGNOSIS — F3181 Bipolar II disorder: Secondary | ICD-10-CM | POA: Diagnosis present

## 2023-12-11 DIAGNOSIS — J189 Pneumonia, unspecified organism: Secondary | ICD-10-CM

## 2023-12-11 DIAGNOSIS — Z79899 Other long term (current) drug therapy: Secondary | ICD-10-CM | POA: Diagnosis not present

## 2023-12-11 DIAGNOSIS — J159 Unspecified bacterial pneumonia: Secondary | ICD-10-CM | POA: Diagnosis present

## 2023-12-11 DIAGNOSIS — Z8249 Family history of ischemic heart disease and other diseases of the circulatory system: Secondary | ICD-10-CM | POA: Diagnosis not present

## 2023-12-11 DIAGNOSIS — K219 Gastro-esophageal reflux disease without esophagitis: Secondary | ICD-10-CM | POA: Diagnosis present

## 2023-12-11 DIAGNOSIS — Z1152 Encounter for screening for COVID-19: Secondary | ICD-10-CM | POA: Diagnosis not present

## 2023-12-11 DIAGNOSIS — E66813 Obesity, class 3: Secondary | ICD-10-CM | POA: Diagnosis present

## 2023-12-11 DIAGNOSIS — Z7985 Long-term (current) use of injectable non-insulin antidiabetic drugs: Secondary | ICD-10-CM | POA: Diagnosis not present

## 2023-12-11 DIAGNOSIS — E861 Hypovolemia: Secondary | ICD-10-CM | POA: Diagnosis present

## 2023-12-11 DIAGNOSIS — E559 Vitamin D deficiency, unspecified: Secondary | ICD-10-CM | POA: Diagnosis present

## 2023-12-11 DIAGNOSIS — J9601 Acute respiratory failure with hypoxia: Secondary | ICD-10-CM | POA: Diagnosis present

## 2023-12-11 DIAGNOSIS — G8929 Other chronic pain: Secondary | ICD-10-CM | POA: Diagnosis present

## 2023-12-11 LAB — RESPIRATORY PANEL BY PCR

## 2023-12-11 MED ORDER — RISPERIDONE 0.25 MG PO TABS
0.2500 mg | ORAL_TABLET | Freq: Every day | ORAL | Status: DC
  Administered 2023-12-11: 0.25 mg via ORAL
  Filled 2023-12-11: qty 1

## 2023-12-11 MED ORDER — CITALOPRAM HYDROBROMIDE 20 MG PO TABS
30.0000 mg | ORAL_TABLET | Freq: Every day | ORAL | Status: DC
  Administered 2023-12-11 – 2023-12-12 (×2): 30 mg via ORAL
  Filled 2023-12-11 (×2): qty 1

## 2023-12-11 MED ORDER — SODIUM CHLORIDE 0.9 % IV SOLN
1.0000 g | INTRAVENOUS | Status: DC
  Administered 2023-12-11: 1 g via INTRAVENOUS
  Filled 2023-12-11: qty 10

## 2023-12-11 MED ORDER — DOXYCYCLINE HYCLATE 100 MG PO TABS: 100.0000 mg | ORAL_TABLET | Freq: Two times a day (BID) | ORAL | Status: DC

## 2023-12-11 MED ORDER — ENOXAPARIN SODIUM 60 MG/0.6ML IJ SOSY
0.5000 mg/kg | PREFILLED_SYRINGE | INTRAMUSCULAR | Status: DC
  Filled 2023-12-11: qty 0.6

## 2023-12-11 MED ORDER — ASPIRIN 81 MG PO TBEC
81.0000 mg | DELAYED_RELEASE_TABLET | Freq: Every day | ORAL | Status: DC
  Administered 2023-12-11 – 2023-12-12 (×2): 81 mg via ORAL
  Filled 2023-12-11 (×2): qty 1

## 2023-12-11 MED ORDER — DIVALPROEX SODIUM ER 250 MG PO TB24
250.0000 mg | ORAL_TABLET | Freq: Every day | ORAL | Status: DC
  Administered 2023-12-11 – 2023-12-12 (×2): 250 mg via ORAL
  Filled 2023-12-11 (×2): qty 1

## 2023-12-11 MED ORDER — ATORVASTATIN CALCIUM 80 MG PO TABS
80.0000 mg | ORAL_TABLET | Freq: Every day | ORAL | Status: DC
  Administered 2023-12-11 – 2023-12-12 (×2): 80 mg via ORAL
  Filled 2023-12-11 (×2): qty 1

## 2023-12-11 MED ORDER — ACETAMINOPHEN 325 MG PO TABS
650.0000 mg | ORAL_TABLET | Freq: Four times a day (QID) | ORAL | Status: DC | PRN
  Administered 2023-12-11 (×2): 650 mg via ORAL
  Filled 2023-12-11 (×2): qty 2

## 2023-12-11 MED ORDER — BUPROPION HCL ER (XL) 150 MG PO TB24
450.0000 mg | ORAL_TABLET | Freq: Every day | ORAL | Status: DC
  Administered 2023-12-11 – 2023-12-12 (×2): 450 mg via ORAL
  Filled 2023-12-11 (×2): qty 3

## 2023-12-11 MED ORDER — DOXYCYCLINE HYCLATE 100 MG PO TABS
100.0000 mg | ORAL_TABLET | Freq: Two times a day (BID) | ORAL | Status: DC
  Administered 2023-12-11 – 2023-12-12 (×2): 100 mg via ORAL
  Filled 2023-12-11 (×2): qty 1

## 2023-12-11 MED ORDER — DIVALPROEX SODIUM ER 250 MG PO TB24: 250.0000 mg | ORAL_TABLET | Freq: Two times a day (BID) | ORAL | Status: DC

## 2023-12-11 MED ORDER — DIVALPROEX SODIUM ER 500 MG PO TB24
500.0000 mg | ORAL_TABLET | Freq: Every day | ORAL | Status: DC
  Administered 2023-12-11: 500 mg via ORAL
  Filled 2023-12-11: qty 1

## 2023-12-11 MED ORDER — ALBUTEROL SULFATE (2.5 MG/3ML) 0.083% IN NEBU: 2.5000 mg | INHALATION_SOLUTION | RESPIRATORY_TRACT | Status: DC | PRN

## 2023-12-11 MED ORDER — SODIUM CHLORIDE 0.9 % IV BOLUS
500.0000 mL | Freq: Once | INTRAVENOUS | Status: AC
  Administered 2023-12-11: 500 mL via INTRAVENOUS

## 2023-12-11 MED ORDER — ISOSORBIDE MONONITRATE ER 30 MG PO TB24
30.0000 mg | ORAL_TABLET | Freq: Every day | ORAL | Status: DC
  Administered 2023-12-11 – 2023-12-12 (×2): 30 mg via ORAL
  Filled 2023-12-11 (×2): qty 1

## 2023-12-11 NOTE — H&P (Addendum)
 History and Physical    Lauren Huffman FMW:[id number] DOB: 22-Jan-1956 DOA: 12/10/2023  PCP: Aisha Harvey, MD   Patient coming from: Home   Chief Complaint: Lightheadedness / SOB   HPI:  Lauren Huffman is a 68 y.o. 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 Reports that over the past month both her and her husband have had a cough over the past month which has been minimally productive.  Otherwise not having significant other respiratory symptoms, no shortness of breath until today.  Reports that today she started to feel flushed and hot while in line at [employer], diaphoretic.  And later when walking around [facility] she got nauseous, lightheaded and had a fleeting episode of right jaw pain.  Noted that she was very short of breath after walking around the store.  Attributed some of the symptoms to dehydration, but family convinced her to call EMS.  Denies recent fever, chills, URI symptoms, change in cough.  Denies exertional chest pain or pressure, jaw pain with activity previously.   Review of Systems:  ROS complete and negative except as marked above   Allergies  Allergen Reactions   Cartia  Xt [Diltiazem  Hcl Er Beads] Other (See Comments)    Increases depression   Gabapentin  Other (See Comments)    Weakness and muscle contractions   Metoprolol  Other (See Comments)    INCREASED DEPRESSION    Promethazine -Dm Other (See Comments)    Other Reaction(s): nausea, dizziness   Trazodone  Other (See Comments)    Exacerbates depression   Diltiazem  Other (See Comments)    Other Reaction(s): depression   Sulfa Antibiotics Other (See Comments)    Family History; unknown reaction per patient.    Prior to Admission medications   Medication Sig Start Date End Date Taking? Authorizing Provider  aspirin  EC 81 MG tablet Take 1 tablet (81 mg total) by mouth  daily. Swallow whole. 12/06/19  Yes Turner, Wilbert SAUNDERS, MD  atorvastatin  (LIPITOR ) 80 MG tablet Take 1 tablet (80 mg total) by mouth daily. 01/21/21  Yes Turner, Wilbert SAUNDERS, MD  buPROPion  (WELLBUTRIN  XL) 150 MG 24 hr tablet Take 450 mg by mouth daily. 04/08/23  Yes [provider]  Calcium -Phosphorus-Vitamin D  (CALCIUM  GUMMIES PO) Take 500 mg by mouth in the morning and at bedtime.   Yes [provider]  cholecalciferol  (VITAMIN D3) 25 MCG (1000 UNIT) tablet Take 1,000 Units by mouth daily.   Yes [provider]  citalopram  (CELEXA ) 20 MG tablet Take 30 mg by mouth daily. 10/11/23  Yes [provider]  divalproex  (DEPAKOTE  ER) 250 MG 24 hr tablet Take 250-500 mg by mouth in the morning and at bedtime. Take one tablet (250mg ) by mouth in the morning, and take two tablets (500mg ) by mouth at bedtime. 10/19/23  Yes [provider]  ELDERBERRY PO Take 1 each by mouth at bedtime.   Yes [provider]  fexofenadine (ALLEGRA ALLERGY) 180 MG tablet Take 180 mg by mouth at bedtime. 05/12/23  Yes [provider]  folic acid  (FOLVITE ) 1 MG tablet Take 2 mg by mouth daily.    Yes [provider]  furosemide (LASIX) 20 MG tablet Take 20 mg by mouth daily. 08/24/23  Yes [provider]  isosorbide  mononitrate (IMDUR ) 30 MG 24 hr tablet Take 1 tablet (30 mg total) by mouth daily. 07/09/23  Yes Turner, Wilbert SAUNDERS,  MD  Loperamide-Simethicone  2-125 MG TABS Take 1 tablet by mouth daily as needed.   Yes [provider]  Multiple Vitamins-Minerals (MULTIVITAMIN GUMMIES ADULT PO) Take 2 each by mouth at bedtime.   Yes [provider]  naproxen sodium (ALEVE) 220 MG tablet Take 220 mg by mouth daily as needed (pain).   Yes [provider]  nystatin powder Apply 1 Application topically 2 (two) times daily as needed (yeast in armpits). 11/09/22  Yes [provider]  potassium chloride (KLOR-CON M) 10 MEQ tablet Take 10 mEq by mouth  daily. 08/24/23  Yes [provider]  Probiotic Product (ALIGN) CHEW Chew 1 each by mouth at bedtime.   Yes [provider]  risperiDONE  (RISPERDAL ) 0.25 MG tablet Take 0.25 mg by mouth at bedtime. 11/15/23  Yes [provider]  Semaglutide, 2 MG/DOSE, (OZEMPIC, 2 MG/DOSE,) 8 MG/3ML SOPN Inject 2 mg into the skin once a week. Inject on Sunday 06/24/22  Yes [provider]  TURMERIC PO Take 1 capsule by mouth at bedtime.   Yes [provider]    Past Medical History:  Diagnosis Date   Aneurysm, lower extremity (HCC)    Anxiety    Arthritis    Bipolar 2 disorder (HCC)    CAD (coronary artery disease)    a. by Cor CTA 11/2019.   Carpal tunnel syndrome    Right   Chronic pain    Depression    GERD (gastroesophageal reflux disease)    Headache(784.0)    Hiatal hernia    History of colon polyps    History of iron deficiency    Hyperlipemia    Hypertension    Joint pain    Lactose intolerance    Left arm pain    Lower extremity edema    Memory difficulty    Mental disorder    Obesity    OSA (obstructive sleep apnea)    Osteoarthritis    Prediabetes    Pulmonary nodules    Sleep apnea    DOES NOT WEAR CPAP     Sleep disturbance    SOB (shortness of breath)    Status post ablation of incompetent vein using laser 2x in Nov. 2017   right   SVT (supraventricular tachycardia) (HCC)    TMJ (dislocation of temporomandibular joint)    Varicose veins    Vitamin D  insufficiency     Past Surgical History:  Procedure Laterality Date   CYST REMOVAL HAND Left    JOINT REPLACEMENT Right    knee   KNEE ARTHROPLASTY  12/25/2011   Procedure: COMPUTER ASSISTED TOTAL KNEE ARTHROPLASTY;  Surgeon: Norleen LITTIE Gavel, MD;  Location: [facility] OR;  Service: Orthopedics;  Laterality: Right;  Right total knee replacement, general anesthesia, femoral nerve block   KNEE ARTHROPLASTY Left 07/08/2012   Procedure: COMPUTER ASSISTED TOTAL KNEE ARTHROPLASTY;  Surgeon: Norleen LITTIE Gavel, MD;  Location: [facility] OR;  Service: Orthopedics;  Laterality: Left;  PRE OP FEMORAL NERVE BLOCK   lazer vein surgery  2017   right leg   TOTAL HIP ARTHROPLASTY Right 03/27/2016   TOTAL HIP ARTHROPLASTY Right 03/27/2016   Procedure: TOTAL HIP ARTHROPLASTY ANTERIOR APPROACH;  Surgeon: Norleen Gavel, MD;  Location: [facility] OR;  Service: Orthopedics;  Laterality: Right;   TOTAL KNEE ARTHROPLASTY Left 07/08/2012   Dr Gavel     reports that she has never smoked. She has never used smokeless tobacco. She reports that she does not currently use alcohol. She reports  that she does not use drugs.  Family History  Problem Relation Age of Onset   Stroke Mother    Seizures Mother    Memory loss Mother    CAD Mother        had a stent   Hyperlipidemia Mother    Heart disease Mother    Thyroid  disease Mother    Cancer Father        Salivary Gland    Heart disease Father        2 silent MI's   Diabetes Father    Supraventricular tachycardia Sister    Hyperlipidemia Sister      Physical Exam: Vitals:   12/10/23 2200 12/10/23 2300 12/11/23 0130 12/11/23 0132  BP: (!) 143/91 (!) 138/106 136/79   Pulse: (!) 107 (!) 112 85   Resp: 13 (!) 24 (!) 22   Temp:    98 F (36.7 C)  TempSrc:    Oral  SpO2: 94% 93% 96%   Weight:      Height:        Gen: Awake, alert, NAD   CV: Regular, normal S1, S2, no murmurs  Resp: Normal WOB, coarse but otherwise clear Abd: Flat, normoactive, nontender MSK: Very slight asymmetric edema right greater than left, nonpitting, more varicose veins on the right side Skin: No rashes or lesions to exposed skin  Neuro: Alert and interactive  Psych: euthymic, appropriate    Data review:   Labs reviewed, notable for:   Chemistries unremarkable High-sensitivity Trop 7 -> 9 WBC 9  Micro:  Results for orders placed or performed during the hospital encounter of 12/10/23  Resp panel by RT-PCR (RSV, Flu A&B, Covid) Anterior Nasal Swab     Status: None   Collection  Time: 12/10/23 10:04 PM   Specimen: Anterior Nasal Swab  Result Value Ref Range Status   SARS Coronavirus 2 by RT PCR NEGATIVE NEGATIVE Final   Influenza A by PCR NEGATIVE NEGATIVE Final   Influenza B by PCR NEGATIVE NEGATIVE Final    Comment: (NOTE) The Xpert Xpress SARS-CoV-2/FLU/RSV plus assay is intended as an aid in the diagnosis of influenza from Nasopharyngeal swab specimens and should not be used as a sole basis for treatment. Nasal washings and aspirates are unacceptable for Xpert Xpress SARS-CoV-2/FLU/RSV testing.  Fact Sheet for Patients: BloggerCourse.com  Fact Sheet for Healthcare Providers: SeriousBroker.it  This test is not yet approved or cleared by the [place]  FDA and has been authorized for detection and/or diagnosis of SARS-CoV-2 by FDA under an Emergency Use Authorization (EUA). This EUA will remain in effect (meaning this test can be used) for the duration of the COVID-19 declaration under Section 564(b)(1) of the Act, 21 U.S.C. section 360bbb-3(b)(1), unless the authorization is terminated or revoked.     Resp Syncytial Virus by PCR NEGATIVE NEGATIVE Final    Comment: (NOTE) Fact Sheet for Patients: BloggerCourse.com  Fact Sheet for Healthcare Providers: SeriousBroker.it  This test is not yet approved or cleared by the [place]  FDA and has been authorized for detection and/or diagnosis of SARS-CoV-2 by FDA under an Emergency Use Authorization (EUA). This EUA will remain in effect (meaning this test can be used) for the duration of the COVID-19 declaration under Section 564(b)(1) of the Act, 21 U.S.C. section 360bbb-3(b)(1), unless the authorization is terminated or revoked.  Performed at [facility], [address] [place], [place] [address]     Imaging reviewed:  CT Angio Chest PE W and/or Wo  Contrast Result Date:  12/10/2023 EXAM: CTA of the Chest with contrast for PE 12/10/2023 08:23:54 PM TECHNIQUE: CTA of the chest was performed after the administration of intravenous contrast. Multiplanar reformatted images are provided for review. MIP images are provided for review. Automated exposure control, iterative reconstruction, and/or weight based adjustment of the mA/kV was utilized to reduce the radiation dose to as low as reasonably achievable. 75mL of iohexol  (OMNIPAQUE ) 350 MG/ML injection was used. COMPARISON: None available. CLINICAL HISTORY: Pulmonary embolism (PE) suspected, high probability, near syncope and tachycardia. Ran errands in car without A/C. Pt felt she got too hot and had too much caffeine today. Pale and clammy per EMS. HR 140 initially, BP 100 palpated. FINDINGS: PULMONARY ARTERIES: Pulmonary arteries are adequately opacified for evaluation. No pulmonary embolism. MEDIASTINUM: The heart and pericardium demonstrate no acute abnormality. There is no acute abnormality of the thoracic aorta. LYMPH NODES: No mediastinal, hilar or axillary lymphadenopathy. LUNGS AND PLEURA: The lungs are without acute process. A linear ground glass opacity in the right upper lobe is favored to represent scarring. An 8 mm groundglass nodule in the right upper lobe (series 6, image 48) is noted. This is stable compared to CT December 04, 2019. According to the Fleischner Society pulmonary nodule recommendations follow-up should be continued until 5 years of stability as documented. An additional stable 4 mm subpleural nodule in the left lower lobe is seen on series 6, image 87. Patchy ground glass opacities in the subpleural lingula are also present. Chronic scarring or atelectasis in the right lower lobe is noted. No pleural effusion or pneumothorax. UPPER ABDOMEN: Moderate hiatal hernia is present. SOFT TISSUES AND BONES: No acute bone or soft tissue abnormality. IMPRESSION: 1. No pulmonary embolism. 2. Scattered ground-glass  opacities are favored to be infectious or inflammatory. 3. 8 mm ground-glass nodule in the right upper lobe. Per Fleischner Society guidelines, follow-up every 2 years until 5 years of stability has been documented is recommended. 4. Moderate hiatal hernia. Electronically signed by: Norman Gatlin MD 12/10/2023 08:39 PM EDT RP Workstation: [id number]   DG Chest Port 1 View Result Date: 12/10/2023 CLINICAL DATA:  Near syncope, tachycardia EXAM: PORTABLE CHEST 1 VIEW COMPARISON:  07/28/2019 FINDINGS: Single frontal view of the chest demonstrates a stable cardiac silhouette. No airspace disease, effusion, or pneumothorax. No acute fracture. IMPRESSION: 1. No acute intrathoracic process. Electronically Signed   By: Ozell Daring M.D.   On: 12/10/2023 17:40    EKG:  Personally reviewed -   Initial appears to be SVT ? Retrograde p waves, rate 133 Most recent is sinus tachycardia rate 109, QTc 543, no acute ischemic changes  ED Course:  Treated with 1 L IVF, Ceftriaxone , Azithromycin . Desat to mid 69s with ambulation.    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  Found to have likely community-acquired pneumonia, acute hypoxic respiratory failure  Community-acquired pneumonia Acute hypoxic respiratory failure P/w subacute cough, minimally productive, then acute worsening SOB x 1 day. Requiring 2L O2 with activity to maintain sat.  WBC 9.  Flu/COVID/RSV negative.  CTA for PE negative for PE, demonstrating scattered ground glass opacities, likely infectious in this setting. ?  Postviral bacterial pneumonia given the acute worsening after a more subacute phase. - Continue ceftriaxone  1 g IV every 24 hours, switch azithromycin  to doxycycline  given her prolonged QTc - Check  sputum culture, RVP - Albuterol  every 4 hours as needed,  incentive spirometry, encourage out of bed to chair - Home O2 desat screen ordered, PT   Hypovolemia ? SVT -> Sinus Tachycardia Likely vagal / orthostatic symptoms prior to arrival. Initial heart rate in the 130s 140s, initial EKG concerning for SVT, possible AVNRT with retrograde p waves; and improved with IV fluid. With repeat more consistent with sinus tachycardia. Reports decreased intake. - S/p 1 L of fluid in the ED.  Given additional 500 cc then continue oral hydration -- Check orthostatics in a.m.  Prolonged QTc QTc 543 - Avoid QT prolonging medications; recommend f/u EKG as outpatient to monitor.   Chronic medical problems: CAD: Follows with cardiology, continue on aspirin , atorvastatin  SVT: Isolated episode in ' 18 without recurrence Hypertension: Continue home ISMN. Hold home lasix with volume depletion.  Prediabetes: On metformin  outpatient, hold Hyperlipidemia: Continue home atorvastatin  Obesity: On Wegovy outpatient Venous insufficiency: Noted, history of vein stripping in the past on the right leg Mood disorder: Continue home bupropion , citalopram , risperidone , valproate.  Recommend continued EKG monitoring for safe use of psychotropic medications that may have QT prolonging (citalopram , risperidone ) OSA: Unable to tolerate CPAP  Body mass index is 40.6 kg/m.    DVT prophylaxis:  Lovenox  Code Status:  Full Code Diet:  Diet Orders (From admission, onward)    None      Family Communication:  None   Consults:  None   Admission status:   Inpatient, Telemetry bed  Severity of Illness: The appropriate patient status for this patient is INPATIENT. Inpatient status is judged to be reasonable and necessary in order to provide the required intensity of service to ensure the patient's safety. The patient's presenting symptoms, physical exam findings, and initial radiographic and laboratory data in the context of their chronic comorbidities is felt to place them at high  risk for further clinical deterioration. Furthermore, it is not anticipated that the patient will be medically stable for discharge from the hospital within 2 midnights of admission.   * I certify that at the point of admission it is my clinical judgment that the patient will require inpatient hospital care spanning beyond 2 midnights from the point of admission due to high intensity of service, high risk for further deterioration and high frequency of surveillance required.*   Dorn Dawson, MD [facility]  How to contact the [facility] Attending or Consulting provider 7A - 7P or covering provider during after hours 7P -7A, for this patient.  Check the care team in [facility] and look for a) attending/consulting [facility] provider listed and b) the [facility] team listed Log into [email or web address] and use [place]'s universal password to access. If you do not have the password, please contact the hospital operator. Locate the [facility] provider you are looking for under [facility] and page to a number that you can be directly reached. If you still have difficulty reaching the provider, please page the [facility] (Director on Call) for the Hospitalists listed on amion for assistance.  12/11/2023, 2:30 AM

## 2023-12-11 NOTE — Plan of Care (Signed)
  Problem: Pain Managment: Goal: General experience of comfort will improve and/or be controlled Outcome: Progressing

## 2023-12-11 NOTE — Progress Notes (Signed)
 Orthostatics: Lying           139/89, HR 79 Sitting          151/101, HR 80 Standing      148/97, HR 88 Standing 3 minutes  155/101, HR 85  Thanks, Garrel Platts RN

## 2023-12-11 NOTE — Progress Notes (Signed)
 Patient admitted earlier this morning for acute respiratory failure with hypoxia with possible community-acquired pneumonia and has been started on Rocephin  and Zithromax .  Patient seen and examined at bedside and plan of care discussed with her.  I have reviewed patient's medical records including this morning's H&P, current vitals, labs and medications myself.  Continue antibiotics.  Follow cultures.  Wean of oxygen as able.  Repeat a.m. labs.

## 2023-12-12 DIAGNOSIS — J9601 Acute respiratory failure with hypoxia: Secondary | ICD-10-CM | POA: Diagnosis not present

## 2023-12-12 LAB — CBC WITH DIFFERENTIAL/PLATELET
Abs Immature Granulocytes: 0.03 K/uL (ref 0.00–0.07)
Basophils Absolute: 0 K/uL (ref 0.0–0.1)
Basophils Relative: 1 %
Eosinophils Absolute: 0.3 K/uL (ref 0.0–0.5)
Eosinophils Relative: 4 %
HCT: 41.5 % (ref 36.0–46.0)
Hemoglobin: 13.6 g/dL (ref 12.0–15.0)
Immature Granulocytes: 0 %
Lymphocytes Relative: 36 %
Lymphs Abs: 2.6 K/uL (ref 0.7–4.0)
MCH: 30.2 pg (ref 26.0–34.0)
MCHC: 32.8 g/dL (ref 30.0–36.0)
MCV: 92 fL (ref 80.0–100.0)
Monocytes Absolute: 0.7 K/uL (ref 0.1–1.0)
Monocytes Relative: 10 %
Neutro Abs: 3.4 K/uL (ref 1.7–7.7)
Neutrophils Relative %: 49 %
Platelets: 186 K/uL (ref 150–400)
RBC: 4.51 MIL/uL (ref 3.87–5.11)
RDW: 13.9 % (ref 11.5–15.5)
WBC: 7 K/uL (ref 4.0–10.5)
nRBC: 0 % (ref 0.0–0.2)

## 2023-12-12 LAB — BASIC METABOLIC PANEL WITH GFR
Anion gap: 12 (ref 5–15)
BUN: 8 mg/dL (ref 8–23)
CO2: 24 mmol/L (ref 22–32)
Calcium: 8.3 mg/dL — ABNORMAL LOW (ref 8.9–10.3)
Chloride: 107 mmol/L (ref 98–111)
Creatinine, Ser: 0.91 mg/dL (ref 0.44–1.00)
GFR, Estimated: >60 mL/min (ref >60)
Glucose, Bld: 90 mg/dL (ref 70–99)
Potassium: 3.6 mmol/L (ref 3.5–5.1)
Sodium: 143 mmol/L (ref 135–145)

## 2023-12-12 LAB — MAGNESIUM: Magnesium: 2 mg/dL (ref 1.7–2.4)

## 2023-12-12 MED ORDER — SODIUM CHLORIDE 0.9 % IV SOLN
1.0000 g | INTRAVENOUS | Status: DC
  Administered 2023-12-12: 1 g via INTRAVENOUS
  Filled 2023-12-12: qty 10

## 2023-12-12 MED ORDER — CEFUROXIME AXETIL 500 MG PO TABS: 500.0000 mg | ORAL_TABLET | Freq: Two times a day (BID) | ORAL | 0 refills | Status: AC

## 2023-12-12 NOTE — Discharge Summary (Signed)
 Physician Discharge Summary  Lauren Huffman FMW:[id number] DOB: Jul 27, 1955 DOA: 12/10/2023  PCP: Aisha Harvey, MD  Admit date: 12/10/2023 Discharge date: 12/12/2023  Admitted From: Home Disposition: Home  Recommendations for Outpatient Follow-up:  Follow up with PCP in 1 week with repeat CBC/BMP Follow up in ED if symptoms worsen or new appear   Home Health: No Equipment/Devices: None  Discharge Condition: Stable CODE STATUS: Full Diet recommendation: Heart healthy  Brief/Interim Summary: 68 year old female with history of CAD, SVT, hypertension, prediabetes, hyperlipidemia, obesity, venous insufficiency, OSA, mood disorder presented with episode of lightheadedness and shortness of breath.  On presentation, she was tachycardic with?  SVT which improved with IV fluids.  CTA chest was negative for PE but showed scattered ground glass opacities.  She was started on IV Rocephin  and doxycycline .  She initially required supplemental oxygen but has been subsequently weaned off.  She feels much better and feels okay to go home today.  She will be discharged home today on oral Ceftin  for 2 more days.  Outpatient follow-up with PCP.  Discharge Diagnoses:   Acute respiratory failure with hypoxia Possible community-acquired bacterial pneumonia -CTA chest was negative for PE but showed scattered ground glass opacities.  She was started on IV Rocephin  and doxycycline .  She initially required supplemental oxygen but has been subsequently weaned off.  She feels much better and feels okay to go home today.  She will be discharged home today on oral Ceftin  for 2 more days.  Outpatient follow-up with PCP.  Hypovolemia - Treated with gentle hydration and subsequently stopped.  Resolved  ?  SVT versus sinus tachycardia - Resolved with gentle hydration.  Currently rate controlled.  CAD Hypertension Hyperlipidemia - Currently stable.  No chest pain.  Resume home regimen of antihypertensives.  Continue  statin.  Outpatient follow-up with PCP  Mood disorder - Continue bupropion , citalopram , valproate and risperidone   OSA - Unable to tolerate CPAP  Obesity class III - Outpatient follow-up.  Resume Ozempic  Prediabetes - Blood sugars stable.  Outpatient follow-up.  Discharge Instructions  Discharge Instructions     Diet - low sodium heart healthy   Complete by: As directed    Increase activity slowly   Complete by: As directed       Allergies as of 12/12/2023       Reactions   Cartia  Xt [diltiazem  Hcl Er Beads] Other (See Comments)   Increases depression   Gabapentin  Other (See Comments)   Weakness and muscle contractions   Metoprolol  Other (See Comments)   INCREASED DEPRESSION    Promethazine -dm Other (See Comments)   Other Reaction(s): nausea, dizziness   Trazodone  Other (See Comments)   Exacerbates depression   Diltiazem  Other (See Comments)   Other Reaction(s): depression   Sulfa Antibiotics Other (See Comments)   Family History; unknown reaction per patient.        Medication List     TAKE these medications    Align Chew Chew 1 each by mouth at bedtime.   Allegra Allergy 180 MG tablet Generic drug: fexofenadine Take 180 mg by mouth at bedtime.   aspirin  EC 81 MG tablet Take 1 tablet (81 mg total) by mouth daily. Swallow whole.   atorvastatin  80 MG tablet Commonly known as: LIPITOR  Take 1 tablet (80 mg total) by mouth daily.   CALCIUM  GUMMIES PO Take 500 mg by mouth in the morning and at bedtime.   cefUROXime  500 MG tablet Commonly known as: CEFTIN  Take 1 tablet (500 mg total)  by mouth 2 (two) times daily for 2 days. Start taking on: December 13, 2023   cholecalciferol  25 MCG (1000 UNIT) tablet Commonly known as: VITAMIN D3 Take 1,000 Units by mouth daily.   citalopram  20 MG tablet Commonly known as: CELEXA  Take 30 mg by mouth daily.   divalproex  250 MG 24 hr tablet Commonly known as: DEPAKOTE  ER Take 250-500 mg by mouth in the  morning and at bedtime. Take one tablet (250mg ) by mouth in the morning, and take two tablets (500mg ) by mouth at bedtime.   ELDERBERRY PO Take 1 each by mouth at bedtime.   folic acid  1 MG tablet Commonly known as: FOLVITE  Take 2 mg by mouth daily.   furosemide 20 MG tablet Commonly known as: LASIX Take 20 mg by mouth daily.   isosorbide  mononitrate 30 MG 24 hr tablet Commonly known as: IMDUR  Take 1 tablet (30 mg total) by mouth daily.   Loperamide-Simethicone  2-125 MG Tabs Take 1 tablet by mouth daily as needed.   MULTIVITAMIN GUMMIES ADULT PO Take 2 each by mouth at bedtime.   naproxen sodium 220 MG tablet Commonly known as: ALEVE Take 220 mg by mouth daily as needed (pain).   nystatin powder Apply 1 Application topically 2 (two) times daily as needed (yeast in armpits).   Ozempic (2 MG/DOSE) 8 MG/3ML Sopn Generic drug: Semaglutide (2 MG/DOSE) Inject 2 mg into the skin once a week. Inject on Sunday   potassium chloride 10 MEQ tablet Commonly known as: KLOR-CON M Take 10 mEq by mouth daily.   risperiDONE  0.25 MG tablet Commonly known as: RISPERDAL  Take 0.25 mg by mouth at bedtime.   TURMERIC PO Take 1 capsule by mouth at bedtime.   Wellbutrin  XL 150 MG 24 hr tablet Generic drug: buPROPion  Take 450 mg by mouth daily.          Follow-up Information     Aisha Harvey, MD. Schedule an appointment as soon as possible for a visit in 1 week(s).   Specialty: Family Medicine Contact information: [address] [place] [address] [phone]                Allergies  Allergen Reactions   Cartia  Xt [Diltiazem  Hcl Er Beads] Other (See Comments)    Increases depression   Gabapentin  Other (See Comments)    Weakness and muscle contractions   Metoprolol  Other (See Comments)    INCREASED DEPRESSION    Promethazine -Dm Other (See Comments)    Other Reaction(s): nausea, dizziness   Trazodone  Other (See Comments)    Exacerbates depression    Diltiazem  Other (See Comments)    Other Reaction(s): depression   Sulfa Antibiotics Other (See Comments)    Family History; unknown reaction per patient.    Consultations: None   Procedures/Studies: CT Angio Chest PE W and/or Wo Contrast Result Date: 12/10/2023 EXAM: CTA of the Chest with contrast for PE 12/10/2023 08:23:54 PM TECHNIQUE: CTA of the chest was performed after the administration of intravenous contrast. Multiplanar reformatted images are provided for review. MIP images are provided for review. Automated exposure control, iterative reconstruction, and/or weight based adjustment of the mA/kV was utilized to reduce the radiation dose to as low as reasonably achievable. 75mL of iohexol  (OMNIPAQUE ) 350 MG/ML injection was used. COMPARISON: None available. CLINICAL HISTORY: Pulmonary embolism (PE) suspected, high probability, near syncope and tachycardia. Ran errands in car without A/C. Pt felt she got too hot and had too much caffeine today. Pale and clammy per EMS.  HR 140 initially, BP 100 palpated. FINDINGS: PULMONARY ARTERIES: Pulmonary arteries are adequately opacified for evaluation. No pulmonary embolism. MEDIASTINUM: The heart and pericardium demonstrate no acute abnormality. There is no acute abnormality of the thoracic aorta. LYMPH NODES: No mediastinal, hilar or axillary lymphadenopathy. LUNGS AND PLEURA: The lungs are without acute process. A linear ground glass opacity in the right upper lobe is favored to represent scarring. An 8 mm groundglass nodule in the right upper lobe (series 6, image 48) is noted. This is stable compared to CT December 04, 2019. According to the Fleischner Society pulmonary nodule recommendations follow-up should be continued until 5 years of stability as documented. An additional stable 4 mm subpleural nodule in the left lower lobe is seen on series 6, image 87. Patchy ground glass opacities in the subpleural lingula are also present. Chronic scarring or  atelectasis in the right lower lobe is noted. No pleural effusion or pneumothorax. UPPER ABDOMEN: Moderate hiatal hernia is present. SOFT TISSUES AND BONES: No acute bone or soft tissue abnormality. IMPRESSION: 1. No pulmonary embolism. 2. Scattered ground-glass opacities are favored to be infectious or inflammatory. 3. 8 mm ground-glass nodule in the right upper lobe. Per Fleischner Society guidelines, follow-up every 2 years until 5 years of stability has been documented is recommended. 4. Moderate hiatal hernia. Electronically signed by: Norman Gatlin MD 12/10/2023 08:39 PM EDT RP Workstation: [id number]   DG Chest Port 1 View Result Date: 12/10/2023 CLINICAL DATA:  Near syncope, tachycardia EXAM: PORTABLE CHEST 1 VIEW COMPARISON:  07/28/2019 FINDINGS: Single frontal view of the chest demonstrates a stable cardiac silhouette. No airspace disease, effusion, or pneumothorax. No acute fracture. IMPRESSION: 1. No acute intrathoracic process. Electronically Signed   By: Ozell Daring M.D.   On: 12/10/2023 17:40      Subjective: Patient seen and examined at bedside.  Feels better and feels okay to go home today.  No fever, vomiting, worsening shortness of breath reported.  Discharge Exam: Vitals:   12/11/23 2303 12/12/23 0533  BP: (!) 154/103 (!) 145/97  Pulse: 76 71  Resp: 20 20  Temp: 98.1 F (36.7 C) 97.9 F (36.6 C)  SpO2: 91% 93%    General: Pt is alert, awake, not in acute distress.  On room air. Cardiovascular: rate controlled, S1/S2 + Respiratory: bilateral decreased breath sounds at bases with scattered crackles Abdominal: Soft, morbidly obese, NT, ND, bowel sounds + Extremities: Trace lower extremity edema; no cyanosis    The results of significant diagnostics from this hospitalization (including imaging, microbiology, ancillary and laboratory) are listed below for reference.     Microbiology: Recent Results (from the past 240 hours)  Resp panel by RT-PCR (RSV, Flu A&B,  Covid) Anterior Nasal Swab     Status: None   Collection Time: 12/10/23 10:04 PM   Specimen: Anterior Nasal Swab  Result Value Ref Range Status   SARS Coronavirus 2 by RT PCR NEGATIVE NEGATIVE Final   Influenza A by PCR NEGATIVE NEGATIVE Final   Influenza B by PCR NEGATIVE NEGATIVE Final    Comment: (NOTE) The Xpert Xpress SARS-CoV-2/FLU/RSV plus assay is intended as an aid in the diagnosis of influenza from Nasopharyngeal swab specimens and should not be used as a sole basis for treatment. Nasal washings and aspirates are unacceptable for Xpert Xpress SARS-CoV-2/FLU/RSV testing.  Fact Sheet for Patients: BloggerCourse.com  Fact Sheet for Healthcare Providers: SeriousBroker.it  This test is not yet approved or cleared by the [place]  FDA and has been authorized for  detection and/or diagnosis of SARS-CoV-2 by FDA under an Emergency Use Authorization (EUA). This EUA will remain in effect (meaning this test can be used) for the duration of the COVID-19 declaration under Section 564(b)(1) of the Act, 21 U.S.C. section 360bbb-3(b)(1), unless the authorization is terminated or revoked.     Resp Syncytial Virus by PCR NEGATIVE NEGATIVE Final    Comment: (NOTE) Fact Sheet for Patients: BloggerCourse.com  Fact Sheet for Healthcare Providers: SeriousBroker.it  This test is not yet approved or cleared by the [place]  FDA and has been authorized for detection and/or diagnosis of SARS-CoV-2 by FDA under an Emergency Use Authorization (EUA). This EUA will remain in effect (meaning this test can be used) for the duration of the COVID-19 declaration under Section 564(b)(1) of the Act, 21 U.S.C. section 360bbb-3(b)(1), unless the authorization is terminated or revoked.  Performed at [facility] Lab, [address] [place], [place] [address]   Respiratory (~20 pathogens) panel  by PCR     Status: None   Collection Time: 12/11/23  2:45 AM   Specimen: Nasopharyngeal Swab; Respiratory  Result Value Ref Range Status   Adenovirus NOT DETECTED NOT DETECTED Final   Coronavirus 229E NOT DETECTED NOT DETECTED Final    Comment: (NOTE) The Coronavirus on the Respiratory Panel, DOES NOT test for the novel  Coronavirus (2019 nCoV)    Coronavirus HKU1 NOT DETECTED NOT DETECTED Final   Coronavirus NL63 NOT DETECTED NOT DETECTED Final   Coronavirus OC43 NOT DETECTED NOT DETECTED Final   Metapneumovirus NOT DETECTED NOT DETECTED Final   Rhinovirus / Enterovirus NOT DETECTED NOT DETECTED Final   Influenza A NOT DETECTED NOT DETECTED Final   Influenza B NOT DETECTED NOT DETECTED Final   Parainfluenza Virus 1 NOT DETECTED NOT DETECTED Final   Parainfluenza Virus 2 NOT DETECTED NOT DETECTED Final   Parainfluenza Virus 3 NOT DETECTED NOT DETECTED Final   Parainfluenza Virus 4 NOT DETECTED NOT DETECTED Final   Respiratory Syncytial Virus NOT DETECTED NOT DETECTED Final   Bordetella pertussis NOT DETECTED NOT DETECTED Final   Bordetella Parapertussis NOT DETECTED NOT DETECTED Final   Chlamydophila pneumoniae NOT DETECTED NOT DETECTED Final   Mycoplasma pneumoniae NOT DETECTED NOT DETECTED Final    Comment: Performed at [facility], [address] [place], [place] [address]     Labs: BNP (last 3 results) No results for input(s): BNP in the last 8760 hours. Basic Metabolic Panel: Recent Labs  Lab 12/10/23 1715 12/12/23 0219  NA 145 143  K 4.0 3.6  CL 108 107  CO2 22 24  GLUCOSE 75 90  BUN 14 8  CREATININE 0.99 0.91  CALCIUM  8.3* 8.3*  MG 2.0 2.0   Liver Function Tests: Recent Labs  Lab 12/10/23 1715  AST 22  ALT 19  ALKPHOS 59  BILITOT 0.7  PROT 5.2*  ALBUMIN 3.1*   No results for input(s): LIPASE, AMYLASE in the last 168 hours. No results for input(s): AMMONIA in the last 168 hours. CBC: Recent Labs  Lab 12/10/23 1715 12/12/23 0219   WBC 9.3 7.0  NEUTROABS 5.1 3.4  HGB 14.7 13.6  HCT 46.2* 41.5  MCV 94.3 92.0  PLT 240 186   Cardiac Enzymes: No results for input(s): CKTOTAL, CKMB, CKMBINDEX, TROPONINI in the last 168 hours. BNP: Invalid input(s): POCBNP CBG: No results for input(s): GLUCAP in the last 168 hours. D-Dimer No results for input(s): DDIMER in the last 72 hours. Hgb A1c No results for  input(s): HGBA1C in the last 72 hours. Lipid Profile No results for input(s): CHOL, HDL, LDLCALC, TRIG, CHOLHDL, LDLDIRECT in the last 72 hours. Thyroid  function studies Recent Labs    12/10/23 1715  TSH 4.386   Anemia work up No results for input(s): VITAMINB12, FOLATE, FERRITIN, TIBC, IRON, RETICCTPCT in the last 72 hours. Urinalysis    Component Value Date/Time   COLORURINE YELLOW 12/10/2023 1715   APPEARANCEUR CLEAR 12/10/2023 1715   LABSPEC 1.006 12/10/2023 1715   PHURINE 7.0 12/10/2023 1715   GLUCOSEU NEGATIVE 12/10/2023 1715   HGBUR NEGATIVE 12/10/2023 1715   BILIRUBINUR NEGATIVE 12/10/2023 1715   KETONESUR NEGATIVE 12/10/2023 1715   PROTEINUR NEGATIVE 12/10/2023 1715   UROBILINOGEN 0.2 06/30/2012 1048   NITRITE NEGATIVE 12/10/2023 1715   LEUKOCYTESUR SMALL (A) 12/10/2023 1715   Sepsis Labs Recent Labs  Lab 12/10/23 1715 12/12/23 0219  WBC 9.3 7.0   Microbiology Recent Results (from the past 240 hours)  Resp panel by RT-PCR (RSV, Flu A&B, Covid) Anterior Nasal Swab     Status: None   Collection Time: 12/10/23 10:04 PM   Specimen: Anterior Nasal Swab  Result Value Ref Range Status   SARS Coronavirus 2 by RT PCR NEGATIVE NEGATIVE Final   Influenza A by PCR NEGATIVE NEGATIVE Final   Influenza B by PCR NEGATIVE NEGATIVE Final    Comment: (NOTE) The Xpert Xpress SARS-CoV-2/FLU/RSV plus assay is intended as an aid in the diagnosis of influenza from Nasopharyngeal swab specimens and should not be used as a sole basis for treatment. Nasal washings  and aspirates are unacceptable for Xpert Xpress SARS-CoV-2/FLU/RSV testing.  Fact Sheet for Patients: BloggerCourse.com  Fact Sheet for Healthcare Providers: SeriousBroker.it  This test is not yet approved or cleared by the [place]  FDA and has been authorized for detection and/or diagnosis of SARS-CoV-2 by FDA under an Emergency Use Authorization (EUA). This EUA will remain in effect (meaning this test can be used) for the duration of the COVID-19 declaration under Section 564(b)(1) of the Act, 21 U.S.C. section 360bbb-3(b)(1), unless the authorization is terminated or revoked.     Resp Syncytial Virus by PCR NEGATIVE NEGATIVE Final    Comment: (NOTE) Fact Sheet for Patients: BloggerCourse.com  Fact Sheet for Healthcare Providers: SeriousBroker.it  This test is not yet approved or cleared by the [place]  FDA and has been authorized for detection and/or diagnosis of SARS-CoV-2 by FDA under an Emergency Use Authorization (EUA). This EUA will remain in effect (meaning this test can be used) for the duration of the COVID-19 declaration under Section 564(b)(1) of the Act, 21 U.S.C. section 360bbb-3(b)(1), unless the authorization is terminated or revoked.  Performed at [facility], [address], [place], [place] [address]   Respiratory (~20 pathogens) panel by PCR     Status: None   Collection Time: 12/11/23  2:45 AM   Specimen: Nasopharyngeal Swab; Respiratory  Result Value Ref Range Status   Adenovirus NOT DETECTED NOT DETECTED Final   Coronavirus 229E NOT DETECTED NOT DETECTED Final    Comment: (NOTE) The Coronavirus on the Respiratory Panel, DOES NOT test for the novel  Coronavirus (2019 nCoV)    Coronavirus HKU1 NOT DETECTED NOT DETECTED Final   Coronavirus NL63 NOT DETECTED NOT DETECTED Final   Coronavirus OC43 NOT DETECTED NOT DETECTED Final    Metapneumovirus NOT DETECTED NOT DETECTED Final   Rhinovirus / Enterovirus NOT DETECTED NOT DETECTED Final   Influenza A NOT DETECTED NOT DETECTED Final   Influenza  B NOT DETECTED NOT DETECTED Final   Parainfluenza Virus 1 NOT DETECTED NOT DETECTED Final   Parainfluenza Virus 2 NOT DETECTED NOT DETECTED Final   Parainfluenza Virus 3 NOT DETECTED NOT DETECTED Final   Parainfluenza Virus 4 NOT DETECTED NOT DETECTED Final   Respiratory Syncytial Virus NOT DETECTED NOT DETECTED Final   Bordetella pertussis NOT DETECTED NOT DETECTED Final   Bordetella Parapertussis NOT DETECTED NOT DETECTED Final   Chlamydophila pneumoniae NOT DETECTED NOT DETECTED Final   Mycoplasma pneumoniae NOT DETECTED NOT DETECTED Final    Comment: Performed at [facility], [address] [place], [place] [address]     Time coordinating discharge: 35 minutes  SIGNED:   Sophie Mao, MD  [facility] 12/12/2023, 7:53 AM

## 2023-12-12 NOTE — TOC Transition Note (Signed)
 Transition of Care [facility]) - Discharge Note   Patient Details  Name: Lauren Huffman MRN: [id number] Date of Birth: 10-31-1955  Transition of [facility]) CM/SW Contact:  Marval Gell, RN Phone Number: 12/12/2023, 10:00 AM   Clinical Narrative:     [facility] [facility]          Patient Goals and CMS Choice            Discharge Placement                       Discharge Plan and Services Additional resources added to the After Visit Summary for                                       Social Drivers of Health (SDOH) Interventions SDOH Screenings   Food Insecurity: No Food Insecurity (12/11/2023)  Housing: Low Risk  (12/11/2023)  Transportation Needs: No Transportation Needs (12/11/2023)  Utilities: Not At Risk (12/11/2023)  Depression (PHQ2-9): Medium Risk (10/03/2019)  Social Connections: Unknown (12/11/2023)  Tobacco Use: Low Risk  (12/10/2023)     Readmission Risk Interventions     No data to display

## 2023-12-12 NOTE — Plan of Care (Signed)
  Problem: Clinical Measurements: Goal: Ability to maintain clinical measurements within normal limits will improve Outcome: Progressing Goal: Diagnostic test results will improve Outcome: Progressing Goal: Respiratory complications will improve Outcome: Progressing Goal: Cardiovascular complication will be avoided Outcome: Progressing   Problem: Activity: Goal: Risk for activity intolerance will decrease Outcome: Progressing

## 2023-12-12 NOTE — Progress Notes (Signed)
 Patient alert and oriented, verbalized understanding of dc instructions. Paperwork and belongings given to patient. Patient transferred to DC lounge.

## 2023-12-13 LAB — T3, FREE: T3, Free: 3.2 pg/mL (ref 2.0–4.4)

## 2023-12-16 DIAGNOSIS — R600 Localized edema: Secondary | ICD-10-CM | POA: Diagnosis not present

## 2023-12-16 DIAGNOSIS — I872 Venous insufficiency (chronic) (peripheral): Secondary | ICD-10-CM | POA: Diagnosis not present

## 2023-12-30 DIAGNOSIS — E559 Vitamin D deficiency, unspecified: Secondary | ICD-10-CM | POA: Diagnosis not present

## 2023-12-30 DIAGNOSIS — I471 Supraventricular tachycardia, unspecified: Secondary | ICD-10-CM | POA: Diagnosis not present

## 2023-12-30 DIAGNOSIS — K219 Gastro-esophageal reflux disease without esophagitis: Secondary | ICD-10-CM | POA: Diagnosis not present

## 2024-01-02 NOTE — Progress Notes (Unsigned)
 Cardiology Office Note    Date:  01/04/2024  ID:  Madelina, Sanda October 01, 1955, MRN [id number] PCP:  Aisha Harvey, MD  Cardiologist:  Wilbert Bihari, MD  Electrophysiologist:  None   Chief Complaint: Follow up for CAD   History of Present Illness: .    MIIA BLANKS is a 68 y.o. female with visit-pertinent history of carpal tunnel, hypertension, hyperlipidemia, depression, anxiety, sleep apnea, varicose veins s/p ablation.  Patient previously seen in 2018 for evaluation of SVT.  Patient had been evaluated in 01/2017 in the ER with palpitations, dizziness, lightheadedness and nausea.  In the ER she was found to be in SVT with heart rate of 157 bpm.  She was given 6 mg of adenosine  with no effect then chemical conversion to normal sinus rhythm with 12 mg of adenosine .  She was started on diltiazem  120 mg daily and lisinopril  was stopped.  She was seen in clinic on 01/21/2017 and reported brief episodes of heart racing for the past 5 years however this always resolves on its own.  Event monitor was recommended however patient declined due to cost.  Echo was basically normal.  Patient was seen in 11/2019 for dyspnea on exertion.  Echo on 11/30/2019 indicated normal LVEF 60 to 65%, G1 DD.  Coronary CTA on 12/04/2019 indicated coronary calcium  score of 156, 85th percentile for age and sex matched control, moderate 50 to 69% proximal and greater than 70% mixed plaque in the LAD and diagonal.  Show there was obstructive plaque in mid diagonal branch but main epicardial arteries had normal flow start on aspirin  80 mg daily, Imdur  30 mg daily and changed her atorvastatin  to 40 mg daily.  Patient was seen in follow-up by Dr. Bihari on 01/21/2021, she was doing well at that time.  Patient was last seen in clinic on 03/11/2022 by Rosaline Fryer, PA.  Patient was doing well at that time and denied any significant plaints, noted some mild shortness of breath when climbing stairs when holding something.  Patient recently  admitted with pneumonia, found to be in SVT vs sinus tachycardia, resolved with gentle hydration.  CTA chest was negative for PE but showed scattered ground glass opacity, was started on IV Rocephin  and doxycycline .  She initially required supplemental oxygen however was weaned off this prior to discharge.  She was discharged in stable condition on 12/12/2023.  She is closely following with her PCP.  Today she presents for follow-up.  She reports that she has been doing well overall aside from her recent hospital admission, prior to this was doing very well with no palpitations.  She reports that she was not having any palpitations, chest pain, shortness of breath prior to recent hospital admission.  She notes that since she left the hospital she been having some mild shortness of breath which she feels is related to pneumonia.  She denies any increased lower extremity edema, orthopnea or PND.  She reports that overall she has been doing very well.  She started on Ozempic to work towards weight loss.  Reports that she has not currently regularly exercising. ROS: .   Today she denies chest pain, lower extremity edema, fatigue, palpitations, melena, hematuria, hemoptysis, diaphoresis, weakness, presyncope, syncope, orthopnea, and PND.  All other systems are reviewed and otherwise negative. Studies Reviewed: SABRA   EKG:  EKG is ordered today, personally reviewed, demonstrating  EKG Interpretation Date/Time:  Tuesday January 04 2024 15:24:20 EDT Ventricular Rate:  91 PR Interval:  166  QRS Duration:  106 QT Interval:  390 QTC Calculation: 479 R Axis:   -47  Text Interpretation: Normal sinus rhythm Incomplete right bundle branch block Left anterior fascicular block Confirmed by Berenice Oehlert [phone]) on 01/04/2024 6:59:19 PM   CV Studies: Cardiac studies reviewed are outlined and summarized above. Otherwise please see EMR for full report. Cardiac Studies & Procedures    ______________________________________________________________________________________________     ECHOCARDIOGRAM  ECHOCARDIOGRAM COMPLETE 11/30/2019  Narrative ECHOCARDIOGRAM REPORT    Patient Name:   JATORIA KNEELAND Boldon    Date of Exam: 11/30/2019 Medical Rec #:  [id number]     Height:       68.0 in Accession #:    [id number]    Weight:       274.0 lb Date of Birth:  [birth year]     BSA:          2.336 m Patient Age:    53 years      BP:           134/86 mmHg Patient Gender: F             HR:           87 bpm. Exam Location:  [address]  Procedure: 3D Echo, 2D Echo, Cardiac Doppler, Color Doppler and Strain Analysis  Indications:    R06.00 Dyspnea on exertion  History:        Patient has prior history of Echocardiogram examinations, most recent 02/03/2017. Risk Factors:Hypertension, Dyslipidemia, Sleep Apnea and Obesity. Pre-diabetes.  Sonographer:    Marshia Rea RAMAN, RDCS Referring Phys: [id number] OLIVIA HERO [facility]  IMPRESSIONS   1. Left ventricular ejection fraction, by estimation, is 60 to 65%. The left ventricle has normal function. The left ventricle has no regional wall motion abnormalities. Left ventricular diastolic parameters are consistent with Grade I diastolic dysfunction (impaired relaxation). The average left ventricular global longitudinal strain is -18.5 %. The global longitudinal strain is normal. 2. Right ventricular systolic function is normal. The right ventricular size is normal. There is normal pulmonary artery systolic pressure. 3. The mitral valve is normal in structure. No evidence of mitral valve regurgitation. No evidence of mitral stenosis. 4. The aortic valve is normal in structure. Aortic valve regurgitation is not visualized. No aortic stenosis is present. 5. The inferior vena cava is normal in size with greater than 50% respiratory variability, suggesting right atrial pressure of 3 mmHg.  Comparison(s): 02/03/17 EF 55-60%.  FINDINGS Left Ventricle: Left  ventricular ejection fraction, by estimation, is 60 to 65%. The left ventricle has normal function. The left ventricle has no regional wall motion abnormalities. The average left ventricular global longitudinal strain is -18.5 %. The global longitudinal strain is normal. The left ventricular internal cavity size was normal in size. There is no left ventricular hypertrophy. Left ventricular diastolic parameters are consistent with Grade I diastolic dysfunction (impaired relaxation). Normal left ventricular filling pressure.  Right Ventricle: The right ventricular size is normal. No increase in right ventricular wall thickness. Right ventricular systolic function is normal. There is normal pulmonary artery systolic pressure. The tricuspid regurgitant velocity is 2.58 m/s, and with an assumed right atrial pressure of 3 mmHg, the estimated right ventricular systolic pressure is 29.6 mmHg.  Left Atrium: Left atrial size was normal in size.  Right Atrium: Right atrial size was normal in size.  Pericardium: There is no evidence of pericardial effusion.  Mitral Valve: The mitral valve is normal in structure. Normal mobility of the mitral valve  leaflets. Mild mitral annular calcification. No evidence of mitral valve regurgitation. No evidence of mitral valve stenosis.  Tricuspid Valve: The tricuspid valve is normal in structure. Tricuspid valve regurgitation is trivial. No evidence of tricuspid stenosis.  Aortic Valve: The aortic valve is normal in structure. Aortic valve regurgitation is not visualized. No aortic stenosis is present.  Pulmonic Valve: The pulmonic valve was normal in structure. Pulmonic valve regurgitation is not visualized. No evidence of pulmonic stenosis.  Aorta: The aortic root is normal in size and structure.  Venous: The inferior vena cava is normal in size with greater than 50% respiratory variability, suggesting right atrial pressure of 3 mmHg.  IAS/Shunts: No atrial level  shunt detected by color flow Doppler.   LEFT VENTRICLE PLAX 2D LVIDd:         4.40 cm  Diastology LVIDs:         3.20 cm  LV e' lateral:   10.70 cm/s LV PW:         0.70 cm  LV E/e' lateral: 4.6 LV IVS:        1.10 cm  LV e' medial:    5.00 cm/s LVOT diam:     2.20 cm  LV E/e' medial:  9.9 LV SV:         72 LV SV Index:   31       2D Longitudinal Strain LVOT Area:     3.80 cm 2D Strain GLS (A2C):   -23.3 % 2D Strain GLS (A3C):   -15.5 % 2D Strain GLS (A4C):   -16.7 % 2D Strain GLS Avg:     -18.5 %  3D Volume EF: 3D EF:        57 % LV EDV:       122 ml LV ESV:       57 ml LV SV:        69 ml  RIGHT VENTRICLE RV Basal diam:  3.80 cm RV S prime:     14.60 cm/s TAPSE (M-mode): 2.2 cm RVSP:           29.6 mmHg  LEFT ATRIUM             Index       RIGHT ATRIUM           Index LA diam:        4.50 cm 1.93 cm/m  RA Pressure: 3.00 mmHg LA Vol (A2C):   30.2 ml 12.93 ml/m RA Area:     16.60 cm LA Vol (A4C):   31.3 ml 13.40 ml/m RA Volume:   45.00 ml  19.26 ml/m LA Biplane Vol: 30.7 ml 13.14 ml/m AORTIC VALVE LVOT Vmax:   96.00 cm/s LVOT Vmean:  62.500 cm/s LVOT VTI:    0.189 m  AORTA Ao Root diam: 3.20 cm Ao Asc diam:  3.30 cm  MITRAL VALVE               TRICUSPID VALVE TR Peak grad:   26.6 mmHg TR Vmax:        258.00 cm/s MV E velocity: 49.70 cm/s  Estimated RAP:  3.00 mmHg MV A velocity: 76.70 cm/s  RVSP:           29.6 mmHg MV E/A ratio:  0.65 SHUNTS Systemic VTI:  0.19 m Systemic Diam: 2.20 cm  Wilbert Bihari MD Electronically signed by Wilbert Bihari MD Signature Date/Time: 11/30/2019/2:43:58 PM    Final      CT SCANS  CT  CORONARY FRACTIONAL FLOW RESERVE DATA PREP 12/05/2019  Narrative EXAM: FFRCT ANALYSIS  FINDINGS: FFRct analysis was performed on the original cardiac CT angiogram dataset. Diagrammatic representation of the FFRct analysis is provided in a separate PDF document in PACS. This dictation was created using the PDF document and an  interactive 3D model of the results. 3D model is not available in the EMR/PACS. Normal FFR range is >0.80.  1. Left Main: No significant stenosis. LM FFR = 0.99.  2. LAD: No significant stenosis. Proximal FFR = 0.99, Mid FFR = 0.97, Distal FFR = 0.93.  3. LCX: No significant stenosis. Proximal FFR = 0.98, Mid FFR = 0.97, Distal FFR = 0.95  4. OM1: No significant stenosis. Proximal FFR = 0.98, Mid to distal FFR = 0.87.  5. RCA: No significant stenosis. Proximal FFR = 0.98, Mid FFR = 0.95, Distal FFR = 0.90.  IMPRESSION:: IMPRESSION: 1. Coronary CT FFR flow analysis demonstrates possible flow limiting lesion in the diagonal #1 branch (FFR 0.79 mid and 0.76 distal). Normal flow in RCA, LCX and LAD.  2.  Recommend medical management.  Wilbert Bihari   Electronically Signed By: Wilbert Bihari On: 12/06/2019 12:46   CT SCANS  CT CORONARY MORPH W/CTA COR W/SCORE 12/04/2019  Addendum 12/04/2019  5:41 PM ADDENDUM REPORT: 12/04/2019 17:39  CLINICAL DATA:  66F with SVT, hypertension, hyperlipidemia, untreated OSA, and family history of CAD with exertional dyspnea needing pre-operative clearance.  EXAM: Cardiac/Coronary  CT  TECHNIQUE: The patient was scanned on a [employer].  FINDINGS: A 120 kV prospective scan was triggered in the descending thoracic aorta at 111 HU's. Axial non-contrast 3 mm slices were carried out through the heart. The data set was analyzed on a dedicated work station and scored using the Agatson method. Gantry rotation speed was 250 msecs and collimation was .6 mm. No beta blockade and 0.8 mg of sl NTG was given. The 3D data set was reconstructed in 5% intervals of the 67-82 % of the R-R cycle. Diastolic phases were analyzed on a dedicated work station using MPR, MIP and VRT modes. The patient received 80 cc of contrast.  Aorta: Normal size. Ascending aorta 3.1 cm. minimal calcification of the aortic root. No dissection.  Aortic  Valve:  Trileaflet.  No calcification.  Coronary Arteries:  Normal coronary origin.  Right dominance.  RCA is a large dominant artery that gives rise to PDA and PLVB. There is minimal (<25%) mixed plaque proximally. The PDA and distal RCA are not well-visualized.  Left main is a large artery that gives rise to LAD and LCX arteries.  LAD is a large vessel that has no plaque. There is mild (25-49%) mixed plaque proximally and moderate (50-69%) mixed plaque in the mid LAD. There is severe (>70%) plaque in the distal LAD. There is a large D1 with diffuse, severe (>70%) mixed plaque.  LCX is a non-dominant artery that gives rise to one large OM1 branch. There is no plaque.  Other findings:  Normal pulmonary vein drainage into the left atrium.  Normal let atrial appendage without a thrombus.  Normal size of the pulmonary artery.  IMPRESSION: 1. Coronary calcium  score of 156. This was 85th percentile for age and sex matched control.  2. Normal coronary origin with right dominance.  3. Moderate (50-69%) proximal and severe (>70%) mixed plaque in the LAD and D1. CAD-RADS 4.  4.  Consider cardiac catheterization.  Annabella Scarce, MD   Electronically Signed By: Annabella Scarce On:  12/04/2019 17:39  Narrative EXAM: OVER-READ INTERPRETATION  CT CHEST  The following report is an over-read performed by radiologist Dr. Toribio Aye of [facility] on 12/04/2019. This over-read does not include interpretation of cardiac or coronary anatomy or pathology. The coronary calcium  score/coronary CTA interpretation by the cardiologist is attached.  COMPARISON:  None.  FINDINGS: Aortic atherosclerosis. Multiple small pulmonary nodules are noted throughout the lungs bilaterally, largest of which is in the anterior aspect of the right upper lobe (axial image 8 of series 15) measuring 5 x 3 mm (mean diameter 4 mm). Within the visualized portions of the thorax there  are no suspicious appearing pulmonary nodules or masses, there is no acute consolidative airspace disease, no pleural effusions, no pneumothorax and no lymphadenopathy. Moderate to large hiatal hernia. Visualized portions of the upper abdomen are unremarkable. There are no aggressive appearing lytic or blastic lesions noted in the visualized portions of the skeleton.  IMPRESSION: 1. Multiple small 2-4 mm pulmonary nodules in the lungs bilaterally, nonspecific, but statistically likely benign. No follow-up needed if patient is low-risk (and has no known or suspected primary neoplasm). Non-contrast chest CT can be considered in 12 months if patient is high-risk. This recommendation follows the consensus statement: Guidelines for Management of Incidental Pulmonary Nodules Detected on CT Images: From the Fleischner Society 2017; Radiology 2017; 284:228-243. 2. Moderate to large hiatal hernia.  Electronically Signed: By: Toribio Aye M.D. On: 12/04/2019 14:58     ______________________________________________________________________________________________       Current Reported Medications:.    Current Meds  Medication Sig   aspirin  EC 81 MG tablet Take 1 tablet (81 mg total) by mouth daily. Swallow whole.   atorvastatin  (LIPITOR ) 80 MG tablet Take 1 tablet (80 mg total) by mouth daily.   buPROPion  (WELLBUTRIN  XL) 150 MG 24 hr tablet Take 450 mg by mouth daily.   Calcium -Phosphorus-Vitamin D  (CALCIUM  GUMMIES PO) Take 500 mg by mouth in the morning and at bedtime.   cholecalciferol  (VITAMIN D3) 25 MCG (1000 UNIT) tablet Take 1,000 Units by mouth daily.   citalopram  (CELEXA ) 20 MG tablet Take 30 mg by mouth daily.   divalproex  (DEPAKOTE  ER) 250 MG 24 hr tablet Take 250-500 mg by mouth in the morning and at bedtime. Take one tablet (250mg ) by mouth in the morning, and take two tablets (500mg ) by mouth at bedtime.   ELDERBERRY PO Take 1 each by mouth at bedtime.   fexofenadine  (ALLEGRA ALLERGY) 180 MG tablet Take 180 mg by mouth at bedtime.   folic acid  (FOLVITE ) 1 MG tablet Take 2 mg by mouth daily.    furosemide (LASIX) 20 MG tablet Take 20 mg by mouth daily.   Loperamide-Simethicone  2-125 MG TABS Take 1 tablet by mouth daily as needed.   Multiple Vitamins-Minerals (MULTIVITAMIN GUMMIES ADULT PO) Take 2 each by mouth at bedtime.   naproxen sodium (ALEVE) 220 MG tablet Take 220 mg by mouth daily as needed (pain).   nystatin powder Apply 1 Application topically 2 (two) times daily as needed (yeast in armpits).   potassium chloride (KLOR-CON M) 10 MEQ tablet Take 10 mEq by mouth daily.   Probiotic Product (ALIGN) CHEW Chew 1 each by mouth at bedtime.   risperiDONE  (RISPERDAL ) 0.25 MG tablet Take 0.25 mg by mouth at bedtime.   Semaglutide, 2 MG/DOSE, (OZEMPIC, 2 MG/DOSE,) 8 MG/3ML SOPN Inject 2 mg into the skin once a week. Inject on Sunday   TURMERIC PO Take 1 capsule by mouth at  bedtime.   [DISCONTINUED] isosorbide  mononitrate (IMDUR ) 30 MG 24 hr tablet Take 1 tablet (30 mg total) by mouth daily.    Physical Exam:    VS:  BP 132/82   Pulse 91   Ht 5' 8 (1.727 m)   Wt 269 lb (122 kg)   SpO2 94%   BMI 40.90 kg/m    Wt Readings from Last 3 Encounters:  01/04/24 269 lb (122 kg)  12/12/23 269 lb 6.4 oz (122.2 kg)  03/11/22 291 lb 6 oz (132.2 kg)    GEN: Well nourished, well developed in no acute distress NECK: No JVD; No carotid bruits CARDIAC: RRR, no murmurs, rubs, gallops RESPIRATORY:  Clear to auscultation without rales, wheezing or rhonchi  ABDOMEN: Soft, non-tender, non-distended EXTREMITIES:  No edema; No acute deformity     Asessement and Plan:.    CAD: Coronary CTA on 12/04/2019 indicated coronary calcium  score of 156, 85th percentile for age and sex matched control, moderate 50 to 69% proximal and greater than 70% mixed plaque in the LAD and diagonal.  Show there was obstructive plaque in mid diagonal branch but main epicardial arteries had  normal flow, medical management was recommended.  Started on aspirin  80 mg daily, Imdur  30 mg daily and changed her atorvastatin  to 40 mg daily. Today she reports that she has been doing well overall, denies any chest pain, notes prior to recent hospital mission with pneumonia she was not having any increased shortness of breath.  We discussed checking an echocardiogram today, patient deferred at this time, will plan for follow-up in 3 months if shortness of breath has not resolved at that point we will plan to repeat echocardiogram.  Reviewed ED precautions.  Continue aspirin  81 mg daily, Lipitor  80 mg daily, Lasix 20 mg daily and Imdur  30 mg daily.  SVT: Patient with episode of SVT in 2018 requiring chemical cardioversion with adenosine .  Patient was previously started on metoprolol  and Cardizem  but was intolerant. Today she reports that she has not been having increased palpitations.  It was noted that when she first presented to the hospital there was question if she was in SVT versus sinus tachycardia in setting of pneumonia, resolved with gentle hydration.  She denies any further palpitations or feeling of being in SVT.  She will continue to monitor.  Reviewed ED precautions.  Hyperlipidemia: Last lipid profile on 05/24/2023 indicated total cholesterol 143, HDL 50, triglycerides 185 and LDL 62.  Continue Lipitor  80 mg daily.  Hypertension: Blood pressure today 132/82.  Continue current antihypertensive regimen.  OSA: Patient with history of OSA, reports she is unable to tolerate CPAP.   Disposition: F/u with Neriah Brott, NP in three months or sooner if needed.   Signed, Kaiden Dardis D Ruble Pumphrey, NP

## 2024-01-04 ENCOUNTER — Encounter: Payer: Self-pay | Admitting: Cardiology

## 2024-01-04 ENCOUNTER — Ambulatory Visit: Attending: Cardiology | Admitting: Cardiology

## 2024-01-04 VITALS — BP 132/82 | HR 91 | Ht 68.0 in | Wt 269.0 lb

## 2024-01-04 DIAGNOSIS — E785 Hyperlipidemia, unspecified: Secondary | ICD-10-CM

## 2024-01-04 DIAGNOSIS — G4733 Obstructive sleep apnea (adult) (pediatric): Secondary | ICD-10-CM

## 2024-01-04 DIAGNOSIS — I471 Supraventricular tachycardia, unspecified: Secondary | ICD-10-CM | POA: Diagnosis not present

## 2024-01-04 DIAGNOSIS — I10 Essential (primary) hypertension: Secondary | ICD-10-CM | POA: Diagnosis not present

## 2024-01-04 DIAGNOSIS — I2510 Atherosclerotic heart disease of native coronary artery without angina pectoris: Secondary | ICD-10-CM | POA: Diagnosis not present

## 2024-01-04 MED ORDER — ISOSORBIDE MONONITRATE ER 30 MG PO TB24: 30.0000 mg | ORAL_TABLET | Freq: Every day | ORAL | 3 refills | Status: AC

## 2024-01-04 NOTE — Patient Instructions (Signed)
 Medication Instructions:  Your physician recommends that you continue on your current medications as directed. Please refer to the Current Medication list given to you today.  *If you need a refill on your cardiac medications before your next appointment, please call your pharmacy*  Lab Work: None ordered  If you have labs (blood work) drawn today and your tests are completely normal, you will receive your results only by: MyChart Message (if you have MyChart) OR A paper copy in the mail If you have any lab test that is abnormal or we need to change your treatment, we will call you to review the results.  Testing/Procedures: None ordered  Follow-Up: At [facility], you and your health needs are our priority.  As part of our continuing mission to provide you with exceptional heart care, our providers are all part of one team.  This team includes your primary Cardiologist (physician) and Advanced Practice Providers or APPs (Physician Assistants and Nurse Practitioners) who all work together to provide you with the care you need, when you need it.  Your next appointment:   3 month(s)  Provider:   Katlyn West, NP          We recommend signing up for the patient portal called MyChart.  Sign up information is provided on this After Visit Summary.  MyChart is used to connect with patients for Virtual Visits (Telemedicine).  Patients are able to view lab/test results, encounter notes, upcoming appointments, etc.  Non-urgent messages can be sent to your provider as well.   To learn more about what you can do with MyChart, go to [email or web address].   Other Instructions

## 2024-01-31 DIAGNOSIS — K5900 Constipation, unspecified: Secondary | ICD-10-CM | POA: Diagnosis not present

## 2024-01-31 DIAGNOSIS — R143 Flatulence: Secondary | ICD-10-CM | POA: Diagnosis not present

## 2024-01-31 DIAGNOSIS — Z1211 Encounter for screening for malignant neoplasm of colon: Secondary | ICD-10-CM | POA: Diagnosis not present

## 2024-01-31 DIAGNOSIS — K219 Gastro-esophageal reflux disease without esophagitis: Secondary | ICD-10-CM | POA: Diagnosis not present

## 2024-03-14 ENCOUNTER — Other Ambulatory Visit: Payer: Self-pay | Admitting: Family Medicine

## 2024-03-14 DIAGNOSIS — Z1231 Encounter for screening mammogram for malignant neoplasm of breast: Secondary | ICD-10-CM

## 2024-03-22 ENCOUNTER — Encounter (HOSPITAL_COMMUNITY): Payer: Self-pay

## 2024-03-22 ENCOUNTER — Other Ambulatory Visit: Payer: Self-pay

## 2024-03-22 ENCOUNTER — Emergency Department (HOSPITAL_COMMUNITY)

## 2024-03-22 ENCOUNTER — Emergency Department (HOSPITAL_COMMUNITY)
Admission: EM | Admit: 2024-03-22 | Discharge: 2024-03-22 | Disposition: A | Discharge status: Home / Self Care | Source: Ambulatory Visit | Attending: Emergency Medicine | Admitting: Emergency Medicine

## 2024-03-22 DIAGNOSIS — I10 Essential (primary) hypertension: Secondary | ICD-10-CM | POA: Diagnosis not present

## 2024-03-22 DIAGNOSIS — R000 Tachycardia, unspecified: Secondary | ICD-10-CM | POA: Diagnosis present

## 2024-03-22 DIAGNOSIS — Z7982 Long term (current) use of aspirin: Secondary | ICD-10-CM | POA: Diagnosis not present

## 2024-03-22 DIAGNOSIS — Z79899 Other long term (current) drug therapy: Secondary | ICD-10-CM | POA: Diagnosis not present

## 2024-03-22 DIAGNOSIS — I471 Supraventricular tachycardia, unspecified: Secondary | ICD-10-CM | POA: Insufficient documentation

## 2024-03-22 LAB — CBC WITH DIFFERENTIAL/PLATELET
Abs Immature Granulocytes: 0.04 K/uL (ref 0.00–0.07)
Basophils Absolute: 0.1 K/uL (ref 0.0–0.1)
Basophils Relative: 1 %
Eosinophils Absolute: 0.2 K/uL (ref 0.0–0.5)
Eosinophils Relative: 2 %
HCT: 46 % (ref 36.0–46.0)
Hemoglobin: 15.1 g/dL — ABNORMAL HIGH (ref 12.0–15.0)
Immature Granulocytes: 1 %
Lymphocytes Relative: 32 %
Lymphs Abs: 2.9 K/uL (ref 0.7–4.0)
MCH: 30.9 pg (ref 26.0–34.0)
MCHC: 32.8 g/dL (ref 30.0–36.0)
MCV: 94.1 fL (ref 80.0–100.0)
Monocytes Absolute: 0.8 K/uL (ref 0.1–1.0)
Monocytes Relative: 9 %
Neutro Abs: 4.9 K/uL (ref 1.7–7.7)
Neutrophils Relative %: 55 %
Platelets: 243 K/uL (ref 150–400)
RBC: 4.89 MIL/uL (ref 3.87–5.11)
RDW: 13.9 % (ref 11.5–15.5)
WBC: 8.9 K/uL (ref 4.0–10.5)
nRBC: 0 % (ref 0.0–0.2)

## 2024-03-22 LAB — COMPREHENSIVE METABOLIC PANEL WITH GFR
ALT: 24 U/L (ref 0–44)
AST: 51 U/L — ABNORMAL HIGH (ref 15–41)
Albumin: 3.6 g/dL (ref 3.5–5.0)
Alkaline Phosphatase: 75 U/L (ref 38–126)
Anion gap: 9 (ref 5–15)
BUN: 13 mg/dL (ref 8–23)
CO2: 28 mmol/L (ref 22–32)
Calcium: 8.4 mg/dL — ABNORMAL LOW (ref 8.9–10.3)
Chloride: 104 mmol/L (ref 98–111)
Creatinine, Ser: 0.91 mg/dL (ref 0.44–1.00)
GFR, Estimated: >60 mL/min (ref >60)
Glucose, Bld: 71 mg/dL (ref 70–99)
Potassium: 5 mmol/L (ref 3.5–5.1)
Sodium: 140 mmol/L (ref 135–145)
Total Bilirubin: 0.5 mg/dL (ref 0.0–1.2)
Total Protein: 5.7 g/dL — ABNORMAL LOW (ref 6.5–8.1)

## 2024-03-22 LAB — MAGNESIUM: Magnesium: 2.2 mg/dL (ref 1.7–2.4)

## 2024-03-22 LAB — TROPONIN T, HIGH SENSITIVITY
Troponin T High Sensitivity: 19 ng/L (ref 0–19)
Troponin T High Sensitivity: 22 ng/L — ABNORMAL HIGH (ref 0–19)

## 2024-03-22 MED ORDER — SODIUM CHLORIDE 0.9 % IV BOLUS
1000.0000 mL | Freq: Once | INTRAVENOUS | Status: AC
  Administered 2024-03-22: 16:00:00 1000 mL via INTRAVENOUS

## 2024-03-22 MED ORDER — ALUM & MAG HYDROXIDE-SIMETH 200-200-20 MG/5ML PO SUSP
30.0000 mL | Freq: Once | ORAL | Status: AC
  Administered 2024-03-22: 16:00:00 30 mL via ORAL
  Filled 2024-03-22: qty 30

## 2024-03-22 MED ORDER — LIDOCAINE VISCOUS HCL 2 % MT SOLN
15.0000 mL | Freq: Once | OROMUCOSAL | Status: AC
  Administered 2024-03-22: 16:00:00 15 mL via ORAL
  Filled 2024-03-22: qty 15

## 2024-03-22 NOTE — ED Triage Notes (Addendum)
 Pt BIB [facility] from [facility], follow up for sinus infection. Pt c/o lightheadedness and dizziness around noon, hypotensive 80's systolic, rate 160s SVT on EMS arrival to out-patient facility. Ems gave 6mg  andesine, converted to sinus tach. Also gave 1500 NS bolus and 4 of Zofran   Hx of Svt about 1 year ago   18G R wrist  VS 112/60, 95 HR, 98% RA, CBG 108

## 2024-03-22 NOTE — ED Provider Notes (Signed)
 [place] EMERGENCY DEPARTMENT AT [facility] Provider Note   CSN: [id number] Arrival date & time: 03/22/24  1450     Patient presents with: Tachycardia   Lauren Huffman is a 68 y.o. female.  With a history of SVT, OSA and hypertension who presents to the ED for palpitations.  Patient was seen at [facility] primary care for follow-up visit after being treated for sinus infection with outpatient antibiotics.  She was noted to be hypotensive and tachycardic in the office.  Reporting lightheadedness and dizziness.  EMS was called.  Upon arrival EMS administered 6 mg IV adenosine  for suspected SVT with successful conversion to sinus tachycardia.  1500 cc normal saline for Zofran  also given prior to arrival.  Upon initial assessment she is still having some epigastric burning discomfort and lightheadedness.  No shortness of breath chest pain sensation of palpitations or other complaints at this time.  States her sinus infection is resolving.   HPI     Prior to Admission medications  Medication Sig Start Date End Date Taking? Authorizing Provider  aspirin  EC 81 MG tablet Take 1 tablet (81 mg total) by mouth daily. Swallow whole. 12/06/19   Shlomo Wilbert SAUNDERS, MD  atorvastatin  (LIPITOR ) 80 MG tablet Take 1 tablet (80 mg total) by mouth daily. 01/21/21   Shlomo Wilbert SAUNDERS, MD  buPROPion  (WELLBUTRIN  XL) 150 MG 24 hr tablet Take 450 mg by mouth daily. 04/08/23   [provider]  Calcium -Phosphorus-Vitamin D  (CALCIUM  GUMMIES PO) Take 500 mg by mouth in the morning and at bedtime.    [provider]  cholecalciferol  (VITAMIN D3) 25 MCG (1000 UNIT) tablet Take 1,000 Units by mouth daily.    [provider]  citalopram  (CELEXA ) 20 MG tablet Take 30 mg by mouth daily. 10/11/23   [provider]  divalproex  (DEPAKOTE  ER) 250 MG 24 hr tablet Take 250-500 mg by mouth in the morning and at bedtime. Take one tablet (250mg ) by mouth in the morning, and take two tablets (500mg ) by  mouth at bedtime. 10/19/23   [provider]  ELDERBERRY PO Take 1 each by mouth at bedtime.    [provider]  fexofenadine (ALLEGRA ALLERGY) 180 MG tablet Take 180 mg by mouth at bedtime. 05/12/23   [provider]  folic acid  (FOLVITE ) 1 MG tablet Take 2 mg by mouth daily.     [provider]  furosemide (LASIX) 20 MG tablet Take 20 mg by mouth daily. 08/24/23   [provider]  isosorbide  mononitrate (IMDUR ) 30 MG 24 hr tablet Take 1 tablet (30 mg total) by mouth daily. 01/04/24   West, Katlyn D, NP  Loperamide-Simethicone  2-125 MG TABS Take 1 tablet by mouth daily as needed.    [provider]  Multiple Vitamins-Minerals (MULTIVITAMIN GUMMIES ADULT PO) Take 2 each by mouth at bedtime.    [provider]  naproxen sodium (ALEVE) 220 MG tablet Take 220 mg by mouth daily as needed (pain).    [provider]  nystatin powder Apply 1 Application topically 2 (two) times daily as needed (yeast in armpits). 11/09/22   [provider]  potassium chloride (KLOR-CON M) 10 MEQ tablet Take 10 mEq by mouth daily. 08/24/23   [provider]  Probiotic Product (ALIGN) CHEW Chew 1 each by mouth at bedtime.    [provider]  risperiDONE  (RISPERDAL ) 0.25 MG tablet Take 0.25 mg by mouth at bedtime. 11/15/23   [provider]  Semaglutide, 2  MG/DOSE, (OZEMPIC, 2 MG/DOSE,) 8 MG/3ML SOPN Inject 2 mg into the skin once a week. Inject on Sunday 06/24/22   [provider]  TURMERIC PO Take 1 capsule by mouth at bedtime.    [provider]    Allergies: Cartia  xt [diltiazem  hcl er beads], Gabapentin , Metoprolol , Promethazine -dm, Trazodone , Diltiazem , and Sulfa antibiotics    Review of Systems  Updated Vital Signs BP 118/78   Pulse 82   Temp 98.2 F (36.8 C) (Oral)   Resp 18   Ht 5' 8 (1.727 m)   Wt 118.8 kg   SpO2 99%   BMI 39.84 kg/m   Physical Exam Vitals and nursing note reviewed.   HENT:     Head: Normocephalic and atraumatic.  Eyes:     Pupils: Pupils are equal, round, and reactive to light.  Cardiovascular:     Rate and Rhythm: Regular rhythm. Tachycardia present.  Pulmonary:     Effort: Pulmonary effort is normal.     Breath sounds: Normal breath sounds.  Abdominal:     Palpations: Abdomen is soft.     Tenderness: There is no abdominal tenderness.  Musculoskeletal:     Right lower leg: No edema.     Left lower leg: No edema.  Skin:    General: Skin is warm and dry.  Neurological:     Mental Status: She is alert.  Psychiatric:        Mood and Affect: Mood normal.     (all labs ordered are listed, but only abnormal results are displayed) Labs Reviewed  COMPREHENSIVE METABOLIC PANEL WITH GFR - Abnormal; Notable for the following components:      Result Value   Calcium  8.4 (*)    Total Protein 5.7 (*)    AST 51 (*)    All other components within normal limits  CBC WITH DIFFERENTIAL/PLATELET - Abnormal; Notable for the following components:   Hemoglobin 15.1 (*)    All other components within normal limits  TROPONIN T, HIGH SENSITIVITY - Abnormal; Notable for the following components:   Troponin T High Sensitivity 22 (*)    All other components within normal limits  MAGNESIUM   TROPONIN T, HIGH SENSITIVITY    EKG: EKG Interpretation Date/Time:  Wednesday March 22 2024 15:01:28 EST Ventricular Rate:  95 PR Interval:  177 QRS Duration:  111 QT Interval:  378 QTC Calculation: 476 R Axis:   264  Text Interpretation: Sinus rhythm Ventricular premature complex Left atrial enlargement Incomplete RBBB and LAFB Probable right ventricular hypertrophy Confirmed by Pamella Sharper [phone]) on 03/22/2024 3:23:41 PM  Radiology: [facility] Chest Portable 1 View Result Date: 03/22/2024 CLINICAL DATA:  Chest pain. EXAM: PORTABLE CHEST 1 VIEW COMPARISON:  Chest radiograph dated 01/13/2024 FINDINGS: Left lung base atelectasis/scarring. No focal consolidation,  pleural effusion, pneumothorax. Stable cardiac silhouette. Poorly visualized hiatal hernia. Osteopenia with degenerative changes of the spine and left shoulder. No acute osseous pathology. IMPRESSION: 1. No active disease. 2. Hiatal hernia. Electronically Signed   By: Vanetta Chou M.D.   On: 03/22/2024 15:53     Procedures   Medications Ordered in the ED  sodium chloride  0.9 % bolus 1,000 mL (0 mLs Intravenous Stopped 03/22/24 1656)  alum & mag hydroxide-simeth (MAALOX/MYLANTA) 200-200-20 MG/5ML suspension 30 mL (30 mLs Oral Given 03/22/24 1539)    And  lidocaine  (XYLOCAINE ) 2 % viscous mouth solution 15 mL (15 mLs Oral Given 03/22/24 1539)    Clinical Course as of 03/22/24 2151  Wed Mar 22, 2024  2149 No recurrent SVT.  Notes troponin negative and delta troponin flat without significant increase.  Laboratory workup unremarkable overall.  Chest x-ray looks clear.  Patient remained stable will follow-up with her PCP.  Counseled her to return for recurrent episodes of SVT at home chest pain or trouble breathing.  She does not mention she had TEE right before this started this afternoon and her previous episodes of SVT may be associated with increased caffeine intake.  Counseled her to limit it to just 1 cup of coffee in the morning [MP]    Clinical Course User Index [MP] Pamella Ozell LABOR, DO                                 Medical Decision Making 68 year old female with history of a presented to the ED given concern for tachycardia hypotension at PCP office.  Recently treated as outpatient for sinus infection.  Sinus infection improving.  Hypotensive with systolic blood pressure in the 80s and an apparent SVT prior to arrival.  Resolved with 6 mg adenosine .  Remains in sinus tachycardia on my initial assessment.  Blood pressure normotensive.  Still having some epigastric discomfort she describes as a burning.  Suspect SVT as likely cause of her discomfort and hypotension earlier but given  some residual epigastric/substernal discomfort will evaluate for ACS with high sensitive troponin EKG lab work chest x-ray and continue to monitor on telemetry.  Amount and/or Complexity of Data Reviewed Labs: ordered. Radiology: ordered.  Risk OTC drugs. Prescription drug management.        Final diagnoses:  SVT (supraventricular tachycardia)    ED Discharge Orders     None          Pamella Ozell LABOR, DO 03/22/24 2151

## 2024-03-22 NOTE — ED Notes (Signed)
Help get patient into a gown on the monitor patient is resting with call bell in reach

## 2024-03-22 NOTE — Discharge Instructions (Signed)
 You were seen in the emerged department for SVT and low blood pressure Your symptoms resolved after a medication called adenosine  from EMS prior to arrival Your blood work chest x-ray and EKG look okay here Continue taking all previous prescribed occasions Limit caffeine intake to just 1 cup of coffee in the morning and stay away from caffeinated teas Follow-up with your primary doctor in 1 week for reevaluation Return to the emerged department for recurrent episodes of SVT chest pain trouble breathing or any concerns

## 2024-03-24 ENCOUNTER — Observation Stay (HOSPITAL_COMMUNITY)
Admission: EM | Admit: 2024-03-24 | Discharge: 2024-03-26 | Disposition: A | Discharge status: Home / Self Care | Attending: Internal Medicine | Admitting: Internal Medicine

## 2024-03-24 ENCOUNTER — Observation Stay (HOSPITAL_COMMUNITY)

## 2024-03-24 DIAGNOSIS — I4719 Other supraventricular tachycardia: Principal | ICD-10-CM | POA: Insufficient documentation

## 2024-03-24 DIAGNOSIS — F32A Depression, unspecified: Secondary | ICD-10-CM | POA: Insufficient documentation

## 2024-03-24 DIAGNOSIS — Z96653 Presence of artificial knee joint, bilateral: Secondary | ICD-10-CM | POA: Insufficient documentation

## 2024-03-24 DIAGNOSIS — Z96641 Presence of right artificial hip joint: Secondary | ICD-10-CM | POA: Diagnosis not present

## 2024-03-24 DIAGNOSIS — I2510 Atherosclerotic heart disease of native coronary artery without angina pectoris: Secondary | ICD-10-CM | POA: Insufficient documentation

## 2024-03-24 DIAGNOSIS — I471 Supraventricular tachycardia, unspecified: Secondary | ICD-10-CM | POA: Diagnosis present

## 2024-03-24 DIAGNOSIS — R7303 Prediabetes: Secondary | ICD-10-CM | POA: Diagnosis not present

## 2024-03-24 DIAGNOSIS — R002 Palpitations: Secondary | ICD-10-CM | POA: Diagnosis present

## 2024-03-24 DIAGNOSIS — G4733 Obstructive sleep apnea (adult) (pediatric): Secondary | ICD-10-CM | POA: Insufficient documentation

## 2024-03-24 DIAGNOSIS — I10 Essential (primary) hypertension: Secondary | ICD-10-CM | POA: Insufficient documentation

## 2024-03-24 DIAGNOSIS — F418 Other specified anxiety disorders: Secondary | ICD-10-CM | POA: Diagnosis present

## 2024-03-24 DIAGNOSIS — E785 Hyperlipidemia, unspecified: Secondary | ICD-10-CM | POA: Insufficient documentation

## 2024-03-24 DIAGNOSIS — I4891 Unspecified atrial fibrillation: Principal | ICD-10-CM | POA: Insufficient documentation

## 2024-03-24 DIAGNOSIS — Z79899 Other long term (current) drug therapy: Secondary | ICD-10-CM | POA: Diagnosis not present

## 2024-03-24 DIAGNOSIS — Z7982 Long term (current) use of aspirin: Secondary | ICD-10-CM | POA: Insufficient documentation

## 2024-03-24 LAB — TROPONIN T, HIGH SENSITIVITY: Troponin T High Sensitivity: 20 ng/L — ABNORMAL HIGH (ref 0–19)

## 2024-03-24 LAB — CBC WITH DIFFERENTIAL/PLATELET
Abs Immature Granulocytes: 0.03 K/uL (ref 0.00–0.07)
Basophils Absolute: 0.1 K/uL (ref 0.0–0.1)
Basophils Relative: 1 %
Eosinophils Absolute: 0.3 K/uL (ref 0.0–0.5)
Eosinophils Relative: 3 %
HCT: 50.6 % — ABNORMAL HIGH (ref 36.0–46.0)
Hemoglobin: 16.4 g/dL — ABNORMAL HIGH (ref 12.0–15.0)
Immature Granulocytes: 0 %
Lymphocytes Relative: 32 %
Lymphs Abs: 3.2 K/uL (ref 0.7–4.0)
MCH: 30.8 pg (ref 26.0–34.0)
MCHC: 32.4 g/dL (ref 30.0–36.0)
MCV: 95.1 fL (ref 80.0–100.0)
Monocytes Absolute: 1 K/uL (ref 0.1–1.0)
Monocytes Relative: 9 %
Neutro Abs: 5.7 K/uL (ref 1.7–7.7)
Neutrophils Relative %: 55 %
Platelets: 241 K/uL (ref 150–400)
RBC: 5.32 MIL/uL — ABNORMAL HIGH (ref 3.87–5.11)
RDW: 13.7 % (ref 11.5–15.5)
WBC: 10.3 K/uL (ref 4.0–10.5)
nRBC: 0 % (ref 0.0–0.2)

## 2024-03-24 LAB — COMPREHENSIVE METABOLIC PANEL WITH GFR
ALT: 24 U/L (ref 0–44)
AST: 27 U/L (ref 15–41)
Albumin: 4 g/dL (ref 3.5–5.0)
Alkaline Phosphatase: 82 U/L (ref 38–126)
Anion gap: 10 (ref 5–15)
BUN: 8 mg/dL (ref 8–23)
CO2: 29 mmol/L (ref 22–32)
Calcium: 8.6 mg/dL — ABNORMAL LOW (ref 8.9–10.3)
Chloride: 104 mmol/L (ref 98–111)
Creatinine, Ser: 0.72 mg/dL (ref 0.44–1.00)
GFR, Estimated: >60 mL/min
Glucose, Bld: 87 mg/dL (ref 70–99)
Potassium: 3.9 mmol/L (ref 3.5–5.1)
Sodium: 143 mmol/L (ref 135–145)
Total Bilirubin: 0.4 mg/dL (ref 0.0–1.2)
Total Protein: 6.2 g/dL — ABNORMAL LOW (ref 6.5–8.1)

## 2024-03-24 LAB — MAGNESIUM: Magnesium: 2.1 mg/dL (ref 1.7–2.4)

## 2024-03-24 MED ORDER — ONDANSETRON HCL 4 MG PO TABS: 4.0000 mg | ORAL_TABLET | Freq: Four times a day (QID) | ORAL | Status: DC | PRN

## 2024-03-24 MED ORDER — CITALOPRAM HYDROBROMIDE 20 MG PO TABS
30.0000 mg | ORAL_TABLET | Freq: Every day | ORAL | Status: DC
  Administered 2024-03-25 – 2024-03-26 (×2): 30 mg via ORAL
  Filled 2024-03-24: qty 1
  Filled 2024-03-24: qty 3
  Filled 2024-03-24: qty 1

## 2024-03-24 MED ORDER — DIVALPROEX SODIUM ER 250 MG PO TB24: 250.0000 mg | ORAL_TABLET | ORAL | Status: DC

## 2024-03-24 MED ORDER — DIVALPROEX SODIUM ER 500 MG PO TB24
500.0000 mg | ORAL_TABLET | Freq: Every day | ORAL | Status: DC
  Administered 2024-03-24 – 2024-03-25 (×2): 500 mg via ORAL
  Filled 2024-03-24 (×3): qty 1

## 2024-03-24 MED ORDER — DIVALPROEX SODIUM ER 250 MG PO TB24
250.0000 mg | ORAL_TABLET | Freq: Every morning | ORAL | Status: DC
  Administered 2024-03-25 – 2024-03-26 (×2): 250 mg via ORAL
  Filled 2024-03-24 (×3): qty 1

## 2024-03-24 MED ORDER — ALUM & MAG HYDROXIDE-SIMETH 200-200-20 MG/5ML PO SUSP
15.0000 mL | Freq: Once | ORAL | Status: AC
  Administered 2024-03-24: 15 mL via ORAL
  Filled 2024-03-24: qty 30

## 2024-03-24 MED ORDER — ONDANSETRON HCL 4 MG/2ML IJ SOLN: 4.0000 mg | Freq: Four times a day (QID) | INTRAMUSCULAR | Status: DC | PRN

## 2024-03-24 MED ORDER — APIXABAN 5 MG PO TABS
5.0000 mg | ORAL_TABLET | Freq: Two times a day (BID) | ORAL | Status: DC
  Administered 2024-03-24 – 2024-03-25 (×2): 5 mg via ORAL
  Filled 2024-03-24 (×2): qty 1

## 2024-03-24 MED ORDER — ATORVASTATIN CALCIUM 80 MG PO TABS
80.0000 mg | ORAL_TABLET | Freq: Every day | ORAL | Status: DC
  Administered 2024-03-25 – 2024-03-26 (×2): 80 mg via ORAL
  Filled 2024-03-24: qty 1
  Filled 2024-03-24: qty 2

## 2024-03-24 MED ORDER — DILTIAZEM HCL 25 MG/5ML IV SOLN
10.0000 mg | Freq: Once | INTRAVENOUS | Status: AC
  Administered 2024-03-24: 10 mg via INTRAVENOUS
  Filled 2024-03-24: qty 5

## 2024-03-24 MED ORDER — SODIUM CHLORIDE 0.9 % IV BOLUS
1000.0000 mL | Freq: Once | INTRAVENOUS | Status: AC
  Administered 2024-03-24: 1000 mL via INTRAVENOUS

## 2024-03-24 MED ORDER — SENNOSIDES-DOCUSATE SODIUM 8.6-50 MG PO TABS: 1.0000 | ORAL_TABLET | Freq: Every evening | ORAL | Status: DC | PRN

## 2024-03-24 MED ORDER — BUPROPION HCL ER (XL) 150 MG PO TB24
450.0000 mg | ORAL_TABLET | Freq: Every day | ORAL | Status: DC
  Administered 2024-03-25 – 2024-03-26 (×2): 450 mg via ORAL
  Filled 2024-03-24 (×2): qty 3

## 2024-03-24 MED ORDER — ISOSORBIDE MONONITRATE ER 30 MG PO TB24
30.0000 mg | ORAL_TABLET | Freq: Every day | ORAL | Status: DC
  Administered 2024-03-25 – 2024-03-26 (×2): 30 mg via ORAL
  Filled 2024-03-24 (×2): qty 1

## 2024-03-24 MED ORDER — ACETAMINOPHEN 650 MG RE SUPP: 650.0000 mg | Freq: Four times a day (QID) | RECTAL | Status: DC | PRN

## 2024-03-24 MED ORDER — DILTIAZEM HCL-DEXTROSE 125-5 MG/125ML-% IV SOLN (PREMIX)
5.0000 mg/h | INTRAVENOUS | Status: DC
  Administered 2024-03-24: 5 mg/h via INTRAVENOUS
  Administered 2024-03-25: 12.5 mg/h via INTRAVENOUS
  Filled 2024-03-24 (×3): qty 125

## 2024-03-24 MED ORDER — POTASSIUM CHLORIDE CRYS ER 10 MEQ PO TBCR
10.0000 meq | EXTENDED_RELEASE_TABLET | Freq: Every day | ORAL | Status: DC
  Administered 2024-03-24 – 2024-03-26 (×3): 10 meq via ORAL
  Filled 2024-03-24 (×3): qty 1

## 2024-03-24 MED ORDER — RISPERIDONE 0.25 MG PO TABS
0.2500 mg | ORAL_TABLET | Freq: Every day | ORAL | Status: DC
  Administered 2024-03-24 – 2024-03-25 (×2): 0.25 mg via ORAL
  Filled 2024-03-24 (×4): qty 1

## 2024-03-24 MED ORDER — SODIUM CHLORIDE 0.9% FLUSH
3.0000 mL | Freq: Two times a day (BID) | INTRAVENOUS | Status: DC
  Administered 2024-03-25: 3 mL via INTRAVENOUS

## 2024-03-24 MED ORDER — ACETAMINOPHEN 325 MG PO TABS: 650.0000 mg | ORAL_TABLET | Freq: Four times a day (QID) | ORAL | Status: DC | PRN

## 2024-03-24 NOTE — H&P (Signed)
 " History and Physical    Lauren Huffman FMW:[id number] DOB: 1955/12/25 DOA: 03/24/2024  PCP: Aisha Harvey, MD  Patient coming from: Home  I have personally briefly reviewed patient's old medical records in [facility]  Chief Complaint: Palpitations  HPI: Lauren Huffman is a 68 y.o. female with medical history significant for paroxysmal SVT, CAD, HTN, HLD, depression/anxiety, obesity, OSA not using CPAP who presented to the ED for evaluation of palpitations.  Patient has a history of SVT.  She was noted to be in SVT on 12/17 when she was seen at her PCPs office.  EMS were called and she was given 6 mg adenosine  with successful conversion to sinus tachycardia.  She was subsequently evaluated in the ED with reassuring workup and was discharged to home.  Today patient developed recurrent symptoms which she thought could be SVT again.  She could feel that her heart rate was fast and she was feeling jittery.  She had indigestion-like symptoms down the middle of her chest and across her abdomen.  She had some mild shortness of breath.  She also reports increased urinary frequency without dysuria.  Patient states that her only new medications are Augmentin and meclizine for recent sinus and inner ear infection.  ED Course  Labs/Imaging on admission: I have personally reviewed following labs and imaging studies.  Initial vitals showed BP 143/128, pulse 134, RR 22, temp 98.3 F, SpO2 97% on room air.  Labs showed WBC 10.3, hemoglobin 16.4, platelets 241, sodium 143, potassium 3.9, bicarb 29, BUN 8, creatinine 0.72, serum glucose 87, LFTs within normal limits, troponin T 20.  EKG showed A-fib with RVR, rate 136, RBBB.  Patient was given 1 L normal saline, IV diltiazem  10 mg followed by continuous infusion.  The hospitalist service was consulted for admission.  Review of Systems: All systems reviewed and are negative except as documented in history of present illness above.   Past Medical  History:  Diagnosis Date   Aneurysm, lower extremity    Anxiety    Arthritis    Bipolar 2 disorder (HCC)    CAD (coronary artery disease)    a. by Cor CTA 11/2019.   Carpal tunnel syndrome    Right   Chronic pain    Depression    GERD (gastroesophageal reflux disease)    Headache(784.0)    Hiatal hernia    History of colon polyps    History of iron deficiency    Hyperlipemia    Hypertension    Joint pain    Lactose intolerance    Left arm pain    Lower extremity edema    Memory difficulty    Mental disorder    Obesity    OSA (obstructive sleep apnea)    Osteoarthritis    Prediabetes    Pulmonary nodules    Sleep apnea    DOES NOT WEAR CPAP     Sleep disturbance    SOB (shortness of breath)    Status post ablation of incompetent vein using laser 2x in Nov. 2017   right   SVT (supraventricular tachycardia)    TMJ (dislocation of temporomandibular joint)    Varicose veins    Vitamin D  insufficiency     Past Surgical History:  Procedure Laterality Date   CYST REMOVAL HAND Left    JOINT REPLACEMENT Right    knee   KNEE ARTHROPLASTY  12/25/2011   Procedure: COMPUTER ASSISTED TOTAL KNEE ARTHROPLASTY;  Surgeon: Norleen LITTIE Gavel, MD;  Location: [facility] OR;  Service: Orthopedics;  Laterality: Right;  Right total knee replacement, general anesthesia, femoral nerve block   KNEE ARTHROPLASTY Left 07/08/2012   Procedure: COMPUTER ASSISTED TOTAL KNEE ARTHROPLASTY;  Surgeon: Norleen LITTIE Gavel, MD;  Location: [facility] OR;  Service: Orthopedics;  Laterality: Left;  PRE OP FEMORAL NERVE BLOCK   lazer vein surgery  2017   right leg   TOTAL HIP ARTHROPLASTY Right 03/27/2016   TOTAL HIP ARTHROPLASTY Right 03/27/2016   Procedure: TOTAL HIP ARTHROPLASTY ANTERIOR APPROACH;  Surgeon: Norleen Gavel, MD;  Location: [facility] OR;  Service: Orthopedics;  Laterality: Right;   TOTAL KNEE ARTHROPLASTY Left 07/08/2012   Dr Gavel    Social History: Social History[1]  Allergies[2]  Family History  Problem Relation Age  of Onset   Stroke Mother    Seizures Mother    Memory loss Mother    CAD Mother        had a stent   Hyperlipidemia Mother    Heart disease Mother    Thyroid  disease Mother    Cancer Father        Salivary Gland    Heart disease Father        2 silent MI's   Diabetes Father    Supraventricular tachycardia Sister    Hyperlipidemia Sister      Prior to Admission medications  Medication Sig Start Date End Date Taking? Authorizing Provider  aspirin  EC 81 MG tablet Take 1 tablet (81 mg total) by mouth daily. Swallow whole. 12/06/19   Shlomo Wilbert SAUNDERS, MD  atorvastatin  (LIPITOR ) 80 MG tablet Take 1 tablet (80 mg total) by mouth daily. 01/21/21   Shlomo Wilbert SAUNDERS, MD  buPROPion  (WELLBUTRIN  XL) 150 MG 24 hr tablet Take 450 mg by mouth daily. 04/08/23   [provider]  Calcium -Phosphorus-Vitamin D  (CALCIUM  GUMMIES PO) Take 500 mg by mouth in the morning and at bedtime.    [provider]  cholecalciferol  (VITAMIN D3) 25 MCG (1000 UNIT) tablet Take 1,000 Units by mouth daily.    [provider]  citalopram  (CELEXA ) 20 MG tablet Take 30 mg by mouth daily. 10/11/23   [provider]  divalproex  (DEPAKOTE  ER) 250 MG 24 hr tablet Take 250-500 mg by mouth in the morning and at bedtime. Take one tablet (250mg ) by mouth in the morning, and take two tablets (500mg ) by mouth at bedtime. 10/19/23   [provider]  ELDERBERRY PO Take 1 each by mouth at bedtime.    [provider]  fexofenadine (ALLEGRA ALLERGY) 180 MG tablet Take 180 mg by mouth at bedtime. 05/12/23   [provider]  folic acid  (FOLVITE ) 1 MG tablet Take 2 mg by mouth daily.     [provider]  furosemide (LASIX) 20 MG tablet Take 20 mg by mouth daily. 08/24/23   [provider]  isosorbide  mononitrate (IMDUR ) 30 MG 24 hr tablet Take 1 tablet (30 mg total) by mouth daily. 01/04/24   West, Katlyn D, NP  Loperamide-Simethicone  2-125 MG TABS Take 1 tablet by mouth daily  as needed.    [provider]  Multiple Vitamins-Minerals (MULTIVITAMIN GUMMIES ADULT PO) Take 2 each by mouth at bedtime.    [provider]  naproxen sodium (ALEVE) 220 MG tablet Take 220 mg by mouth daily as needed (pain).    [provider]  nystatin powder Apply 1 Application topically 2 (two) times daily as needed (yeast in armpits). 11/09/22   [provider]  potassium chloride (KLOR-CON M) 10 MEQ tablet Take 10 mEq by mouth daily. 08/24/23   [provider]  Probiotic Product (ALIGN) CHEW Chew 1 each by mouth at bedtime.    [provider]  risperiDONE  (RISPERDAL ) 0.25 MG tablet Take 0.25 mg by mouth at bedtime. 11/15/23   [provider]  Semaglutide, 2 MG/DOSE, (OZEMPIC, 2 MG/DOSE,) 8 MG/3ML SOPN Inject 2 mg into the skin once a week. Inject on Sunday 06/24/22   [provider]  TURMERIC PO Take 1 capsule by mouth at bedtime.    [provider]    Physical Exam: Vitals:   03/24/24 2015 03/24/24 2030 03/24/24 2045 03/24/24 2100  BP: 128/81 (!) 133/90 (!) 120/99 (!) 134/105  Pulse: (!) 136 (!) 117 (!) 111 61  Resp: 19 (!) 22 19 (!) 27  Temp:      TempSrc:      SpO2: 92% 95% 94% 95%   Constitutional: Resting in bed, NAD, calm, comfortable Eyes: EOMI, lids and conjunctivae normal ENMT: Mucous membranes are moist. Posterior pharynx clear of any exudate or lesions.Normal dentition.  Neck: normal, supple, no masses. Respiratory: clear to auscultation bilaterally, no wheezing, no crackles. Normal respiratory effort. No accessory muscle use.  Cardiovascular: Irregularly irregular and tachycardic. No extremity edema. 2+ pedal pulses. Abdomen: no tenderness, no masses palpated. Musculoskeletal: no clubbing / cyanosis. No joint deformity upper and lower extremities. Good ROM, no contractures. Normal muscle tone.  Skin: no rashes, lesions, ulcers. No induration Neurologic: Sensation intact. Strength 5/5 in all 4.   Psychiatric: Normal judgment and insight. Alert and oriented x 3. Normal mood.   EKG: Personally reviewed.  Atrial fibrillation, rate 136, RBBB.  A-fib is new and rate is faster when compared to previous.  Assessment/Plan Principal Problem:   Atrial fibrillation with RVR (HCC) Active Problems:   Paroxysmal SVT (supraventricular tachycardia)   Coronary artery disease   Essential hypertension   Hyperlipidemia   Depression with anxiety   OSA (obstructive sleep apnea)   Lauren Huffman is a 68 y.o. female with medical history significant for paroxysmal SVT, CAD, HTN, HLD, depression/anxiety, obesity, OSA not using CPAP who is admitted with new onset atrial fibrillation with RVR.  Assessment and Plan: New onset atrial fibrillation with RVR: Patient admitted with new onset A-fib RVR.  She has been started on IV diltiazem  infusion, currently requiring 15 mg/hr for rate control.  CHA2DS2-VASc score is at least 3.  Discussed risk/benefits of anticoagulation for stroke reduction risk and patient agrees to start oral anticoagulation. - Continue IV diltiazem  drip - Obtain echocardiogram - Start Eliquis 5 mg twice daily - Obtain chest x-ray - Check magnesium  and TSH levels  Paroxysmal SVT: Recent event on 12/17 of SVT successfully converted with adenosine .  Now in A-fib as above.  Coronary artery disease: Stable.  Hold aspirin  since she has been started on Eliquis.  Continue Imdur  and atorvastatin .  Hypertension: Continue Imdur .  Hyperlipidemia: Continue atorvastatin .  Mood/depression/anxiety: Continue bupropion , citalopram , Risperdal , Depakote .  OSA: Not using CPAP due to intolerance.  Discussed the long-term risks of untreated OSA with patient particular in the setting of new A-fib.  Patient agrees to follow-up with her PCP/sleep specialist for further management.   DVT prophylaxis:  apixaban (ELIQUIS) tablet 5 mg   Code Status: Full code, confirmed with patient on admission Family  Communication: Discussed with patient, she has discussed with family Disposition Plan: From home, dispo pending clinical progress Consults called: None Severity of Illness: The appropriate patient  status for this patient is OBSERVATION. Observation status is judged to be reasonable and necessary in order to provide the required intensity of service to ensure the patient's safety. The patient's presenting symptoms, physical exam findings, and initial radiographic and laboratory data in the context of their medical condition is felt to place them at decreased risk for further clinical deterioration. Furthermore, it is anticipated that the patient will be medically stable for discharge from the hospital within 2 midnights of admission.   Jorie Blanch MD [facility]  If 7PM-7AM, please contact night-coverage [email or web address]  03/24/2024, 9:23 PM      [1]  Social History Tobacco Use   Smoking status: Never   Smokeless tobacco: Never  Vaping Use   Vaping status: Never Used  Substance Use Topics   Alcohol use: Not Currently   Drug use: No  [2]  Allergies Allergen Reactions   Cartia  Xt [Diltiazem  Hcl Er Beads] Other (See Comments)    Increases depression   Gabapentin  Other (See Comments)    Weakness and muscle contractions   Metoprolol  Other (See Comments)    INCREASED DEPRESSION    Promethazine -Dm Other (See Comments)    Other Reaction(s): nausea, dizziness   Trazodone  Other (See Comments)    Exacerbates depression   Diltiazem  Other (See Comments)    Other Reaction(s): depression   Sulfa Antibiotics Other (See Comments)    Family History; unknown reaction per patient.   "

## 2024-03-24 NOTE — Hospital Course (Signed)
 Lauren Huffman is a 68 y.o. female with medical history significant for paroxysmal SVT, CAD, HTN, HLD, depression/anxiety, obesity, OSA not using CPAP who is admitted with new onset atrial fibrillation with RVR.

## 2024-03-24 NOTE — ED Triage Notes (Addendum)
 PT BIB [facility] from home for palpitations.  Received adenosine  for SVT on Wed.  Had some ingestion as well.  HR ST 130-140 en route. HR 122 on arrival.  No pain complaints. Pt endorses freq urination but also endorses increased fluid intake d/t being dehydrated on visit Wed. PT took 324 ASA prior to EMS arrival.  Pt does not appear in distress on arrival.   20 G L AC.  132/88 HR 122, 96% 2L, CBG 99.

## 2024-03-25 ENCOUNTER — Other Ambulatory Visit: Payer: Self-pay

## 2024-03-25 ENCOUNTER — Observation Stay (HOSPITAL_COMMUNITY)

## 2024-03-25 ENCOUNTER — Encounter (HOSPITAL_COMMUNITY): Payer: Self-pay | Admitting: Internal Medicine

## 2024-03-25 DIAGNOSIS — I4891 Unspecified atrial fibrillation: Secondary | ICD-10-CM | POA: Diagnosis not present

## 2024-03-25 LAB — BASIC METABOLIC PANEL WITH GFR
Anion gap: 9 (ref 5–15)
BUN: 11 mg/dL (ref 8–23)
CO2: 26 mmol/L (ref 22–32)
Calcium: 8.5 mg/dL — ABNORMAL LOW (ref 8.9–10.3)
Chloride: 106 mmol/L (ref 98–111)
Creatinine, Ser: 0.8 mg/dL (ref 0.44–1.00)
GFR, Estimated: >60 mL/min
Glucose, Bld: 130 mg/dL — ABNORMAL HIGH (ref 70–99)
Potassium: 4.3 mmol/L (ref 3.5–5.1)
Sodium: 141 mmol/L (ref 135–145)

## 2024-03-25 LAB — CBC
HCT: 48 % — ABNORMAL HIGH (ref 36.0–46.0)
Hemoglobin: 15.7 g/dL — ABNORMAL HIGH (ref 12.0–15.0)
MCH: 30.8 pg (ref 26.0–34.0)
MCHC: 32.7 g/dL (ref 30.0–36.0)
MCV: 94.1 fL (ref 80.0–100.0)
Platelets: 215 K/uL (ref 150–400)
RBC: 5.1 MIL/uL (ref 3.87–5.11)
RDW: 14 % (ref 11.5–15.5)
WBC: 9.6 K/uL (ref 4.0–10.5)
nRBC: 0 % (ref 0.0–0.2)

## 2024-03-25 LAB — ECHOCARDIOGRAM COMPLETE
Area-P 1/2: 2.62 cm2
Height: 68 in
S' Lateral: 2.1 cm
Weight: 4225.6 [oz_av]

## 2024-03-25 LAB — HIV ANTIBODY (ROUTINE TESTING W REFLEX): HIV Screen 4th Generation wRfx: NONREACTIVE

## 2024-03-25 LAB — TSH: TSH: 3.18 u[IU]/mL (ref 0.350–4.500)

## 2024-03-25 MED ORDER — MECLIZINE HCL 25 MG PO TABS
25.0000 mg | ORAL_TABLET | Freq: Three times a day (TID) | ORAL | Status: DC
  Administered 2024-03-25 – 2024-03-26 (×3): 25 mg via ORAL
  Filled 2024-03-25 (×3): qty 1

## 2024-03-25 MED ORDER — GUAIFENESIN-DM 100-10 MG/5ML PO SYRP
5.0000 mL | ORAL_SOLUTION | ORAL | Status: DC | PRN
  Administered 2024-03-25 – 2024-03-26 (×2): 5 mL via ORAL
  Filled 2024-03-25 (×2): qty 5

## 2024-03-25 MED ORDER — DILTIAZEM HCL 30 MG PO TABS: 30.0000 mg | ORAL_TABLET | Freq: Four times a day (QID) | ORAL | Status: DC | PRN

## 2024-03-25 MED ORDER — CALCIUM CARBONATE ANTACID 500 MG PO CHEW
2.0000 | CHEWABLE_TABLET | Freq: Four times a day (QID) | ORAL | Status: DC | PRN
  Administered 2024-03-25: 400 mg via ORAL
  Filled 2024-03-25: qty 2

## 2024-03-25 MED ORDER — LORATADINE 10 MG PO TABS
10.0000 mg | ORAL_TABLET | Freq: Every day | ORAL | Status: DC
  Administered 2024-03-25 – 2024-03-26 (×2): 10 mg via ORAL
  Filled 2024-03-25 (×2): qty 1

## 2024-03-25 MED ORDER — FAMOTIDINE 20 MG PO TABS
20.0000 mg | ORAL_TABLET | Freq: Every day | ORAL | Status: DC
  Administered 2024-03-25 – 2024-03-26 (×2): 20 mg via ORAL
  Filled 2024-03-25 (×2): qty 1

## 2024-03-25 MED ORDER — ALUM & MAG HYDROXIDE-SIMETH 200-200-20 MG/5ML PO SUSP
15.0000 mL | Freq: Four times a day (QID) | ORAL | Status: DC | PRN
  Administered 2024-03-25: 15 mL via ORAL
  Filled 2024-03-25: qty 30

## 2024-03-25 MED ORDER — BUTALBITAL-APAP-CAFFEINE 50-325-40 MG PO TABS
1.0000 | ORAL_TABLET | Freq: Four times a day (QID) | ORAL | Status: DC | PRN
  Administered 2024-03-25: 1 via ORAL
  Filled 2024-03-25: qty 1

## 2024-03-25 MED ORDER — AMOXICILLIN-POT CLAVULANATE 875-125 MG PO TABS
1.0000 | ORAL_TABLET | Freq: Two times a day (BID) | ORAL | Status: DC
  Administered 2024-03-25 – 2024-03-26 (×3): 1 via ORAL
  Filled 2024-03-25 (×4): qty 1

## 2024-03-25 MED ORDER — ALUM & MAG HYDROXIDE-SIMETH 200-200-20 MG/5ML PO SUSP
15.0000 mL | Freq: Four times a day (QID) | ORAL | Status: DC | PRN
  Administered 2024-03-25 (×2): 15 mL via ORAL
  Filled 2024-03-25 (×2): qty 30

## 2024-03-25 NOTE — ED Notes (Signed)
 Pt ambulated down hall to bathroom and back to room without assistance. Pt tolerated well.

## 2024-03-25 NOTE — Progress Notes (Signed)
 "  Lauren Huffman  FMW:[id number] DOB: Feb 08, 1956 DOA: 03/24/2024 PCP: Aisha Harvey, MD    Brief Narrative:  68 year old with a history of paroxysmal SVT, CAD, HTN, HLD, depression/anxiety, obesity, and OSA not using CPAP who presented to the ER 12/19 with complaint of palpitations.  She had been treated for an episode of SVT 12/17 with adenosine  with successful conversion to sinus tachycardia.  Her symptoms recurred, and she reported to the ER where she was found to be in atrial fibrillation with RVR.  Goals of Care:   Code Status: Full Code   DVT prophylaxis:  apixaban  (ELIQUIS ) tablet 5 mg   Interim Hx: Afebrile. Vital signs stable. C/O HA but denies palpitions or sob. No chest pain.   Assessment & Plan:  Acute SVT w/ hx of Paroxysmal SVT magnesium  and potassium favorable -TSH normal - converted back to NSR w/ IV diltiazem  - Eliquis  discontinued as Cards does not feel this is Afib - to continue prn diltiazem  for future episodes of SVT  Sinus infection POA Was on meclizine  and oral augmentin  as an outpt - resume now to complete course   CAD continue Imdur  ASA and atorvastatin   HTN Bp well controlled   HLD Continue atorvastatin   Depression/anxiety Continue usual bupropion  citalopram  Risperdal  and Depakote   OSA intolerant of CPAP Patient has been counseled as to connection between untreated OSA and atrial fibrillation  Obesity - BMI 39.8   Family Communication: no family present at time of exam Disposition:  probable d/c home 12/21 if rhythm controlled over night    Objective: Blood pressure 100/80, pulse 65, temperature 98.4 F (36.9 C), temperature source Oral, resp. rate 19, SpO2 96%. No intake or output data in the 24 hours ending 03/25/24 9187 There were no vitals filed for this visit.  Examination: General: No acute respiratory distress Lungs: Clear to auscultation bilaterally without wheezes or crackles Cardiovascular: Regular rate and rhythm without murmur   Abdomen: Nontender, nondistended, soft, bowel sounds positive, no rebound, no ascites, no appreciable mass Extremities: No significant cyanosis, clubbing, or edema bilateral lower extremities  CBC: Recent Labs  Lab 03/22/24 1538 03/24/24 1744 03/25/24 0614  WBC 8.9 10.3 9.6  NEUTROABS 4.9 5.7  --   HGB 15.1* 16.4* 15.7*  HCT 46.0 50.6* 48.0*  MCV 94.1 95.1 94.1  PLT 243 241 215   Basic Metabolic Panel: Recent Labs  Lab 03/22/24 1538 03/24/24 1744 03/25/24 0614  NA 140 143 141  K 5.0 3.9 4.3  CL 104 104 106  CO2 28 29 26   GLUCOSE 71 87 130*  BUN 13 8 11   CREATININE 0.91 0.72 0.80  CALCIUM  8.4* 8.6* 8.5*  MG 2.2 2.1  --    GFR: Estimated Creatinine Clearance: 88.7 mL/min (by C-G formula based on SCr of 0.8 mg/dL).   Scheduled Meds:  apixaban   5 mg Oral BID   atorvastatin   80 mg Oral Daily   buPROPion   450 mg Oral Daily   citalopram   30 mg Oral Daily   divalproex   250 mg Oral q morning   And   divalproex   500 mg Oral QHS   isosorbide  mononitrate  30 mg Oral Daily   potassium chloride   10 mEq Oral Daily   risperiDONE   0.25 mg Oral QHS   sodium chloride  flush  3 mL Intravenous Q12H   Continuous Infusions:  diltiazem  (CARDIZEM ) infusion 12.5 mg/hr (03/25/24 0752)     LOS: 0 days   Reyes IVAR Moores, MD [facility]  [phone]  Pager - Text Page per Tracey  If 7PM-7AM, please contact night-coverage per Amion 03/25/2024, 8:12 AM     "

## 2024-03-25 NOTE — Progress Notes (Signed)
*  PRELIMINARY RESULTS* Echocardiogram 2D Echocardiogram has been performed.  Lauren Huffman 03/25/2024, 6:23 PM

## 2024-03-25 NOTE — Consult Note (Signed)
 "  Cardiology Consultation   Patient ID: CECILLE MCCLUSKY MRN: [id number]; DOB: November 18, 1955  Admit date: 03/24/2024 Date of Consult: 03/25/2024  PCP:  Aisha Harvey, MD   [facility] Providers Cardiologist:  Wilbert Bihari, MD       Patient Profile: LINETTE GUNDERSON is a 68 y.o. female with a hx of paroxysmal SVT, CAD, hypertension, hyperlipidemia, depression, anxiety, obesity, OSA intolerant of CPAP who is being seen 03/25/2024 for the evaluation of tachycardia at the request of Dr. Danton.  History of Present Illness: Ms. Tarte is a 68 year old female with above medical history who is followed by Dr. Bihari.  She had been evaluated in 2018 for SVT.  This resolved with adenosine .  She was started on diltiazem .  Recommended event monitor but patient declined due to cost.  Patient previously had an echocardiogram in 11/2019 that showed EF 60-65% with no wall motion abnormalities, grade 1 diastolic dysfunction, normal RV systolic function, no significant valvular abnormalities.  Coronary CTA in 11/2019 showed coronary calcium  score of 156 (80th percentile), moderate proximal and severe mixed plaque in LAD and D1.  FFR showed possible flow limiting lesion in diagonal 1 branch, normal flow in RCA, left circumflex, LAD.  Overall, medical management was recommended.  Started on aspirin , Imdur , Lipitor .  Admitted in 12/2023 with pneumonia.  Found to be in SVT versus sinus tachycardia.  This resolved with gentle hydration.  Seen by cardiology in follow-up on 01/04/2024.  At that time, patient reported she had been doing overall well.  Denied palpitations, chest pain, shortness of breath.  Of note, she previously reported an intolerance to metoprolol  and diltiazem .  Has been monitored over time.  Patient was seen in the ED on 12/17.  She had been seen at her primary care with a sinus infection.  Went into SVT, was given 6 mg of adenosine  and converted to sinus tachycardia.  She was also given IV fluids.  She  was seen at the [facility], ER and was monitored.  Did not have recurrence of SVT so she was discharged.  Patient presented back to the ED on 12/19 complaining of palpitations.  Initial vital signs in the ED with heart rate 137 bpm, oxygen 97% on room air, BP 143/128.Are EKG showed a regular, wide-complex tachycardia with heart rate 136 bpm.  Appears to be SVT with underlying RBBB.  Labs in the ED showed high-sensitivity troponin 20, hemoglobin 16.4, TSH within normal limits.  Chest x-ray showed no acute cardiopulmonary abnormality.  Patient was started on IV diltiazem  with improvement in heart rate.  Cardiology asked to evaluate  On interview, patient tells me that she has had 2-3 episodes of symptomatic SVT in the past.  When she gets SVT, she is able to feel her heart racing.  Feels somewhat dizzy.  She was unable to tolerate maintenance metoprolol  or diltiazem  in the past because this can mess with her depression.  Tells me that for the past 2 weeks or so she has been battling an upper respiratory infection which progressed to a sinus infection.  Was recently started on antibiotics by her primary care provider.  She admits that she has not been eating or drinking as much as she usually does.  She had an episode of SVT on 12/17 that resolved with adenosine  in the ER.  She was trying to increase her oral hydration at home.  On 12/19, she again developed palpitations.  When her heart rate was very elevated she also felt a  bit short of breath.  Denies having chest pain but did develop some jaw pain.  Since coming to the ER, her heart rate has improved and her palpitations have resolved.  She has episodes of indigestion.  Feels a burning across her upper abdomen and through her esophagus.  She denies having any chest pain.   Past Medical History:  Diagnosis Date   Aneurysm, lower extremity    Anxiety    Arthritis    Bipolar 2 disorder (HCC)    CAD (coronary artery disease)    a. by Cor CTA 11/2019.    Carpal tunnel syndrome    Right   Chronic pain    Depression    GERD (gastroesophageal reflux disease)    Headache(784.0)    Hiatal hernia    History of colon polyps    History of iron deficiency    Hyperlipemia    Hypertension    Joint pain    Lactose intolerance    Left arm pain    Lower extremity edema    Memory difficulty    Mental disorder    Obesity    OSA (obstructive sleep apnea)    Osteoarthritis    Prediabetes    Pulmonary nodules    Sleep apnea    DOES NOT WEAR CPAP     Sleep disturbance    SOB (shortness of breath)    Status post ablation of incompetent vein using laser 2x in Nov. 2017   right   SVT (supraventricular tachycardia)    TMJ (dislocation of temporomandibular joint)    Varicose veins    Vitamin D  insufficiency     Past Surgical History:  Procedure Laterality Date   CYST REMOVAL HAND Left    JOINT REPLACEMENT Right    knee   KNEE ARTHROPLASTY  12/25/2011   Procedure: COMPUTER ASSISTED TOTAL KNEE ARTHROPLASTY;  Surgeon: Norleen LITTIE Gavel, MD;  Location: [facility] OR;  Service: Orthopedics;  Laterality: Right;  Right total knee replacement, general anesthesia, femoral nerve block   KNEE ARTHROPLASTY Left 07/08/2012   Procedure: COMPUTER ASSISTED TOTAL KNEE ARTHROPLASTY;  Surgeon: Norleen LITTIE Gavel, MD;  Location: [facility] OR;  Service: Orthopedics;  Laterality: Left;  PRE OP FEMORAL NERVE BLOCK   lazer vein surgery  2017   right leg   TOTAL HIP ARTHROPLASTY Right 03/27/2016   TOTAL HIP ARTHROPLASTY Right 03/27/2016   Procedure: TOTAL HIP ARTHROPLASTY ANTERIOR APPROACH;  Surgeon: Norleen Gavel, MD;  Location: [facility] OR;  Service: Orthopedics;  Laterality: Right;   TOTAL KNEE ARTHROPLASTY Left 07/08/2012   Dr Gavel     Scheduled Meds:  apixaban   5 mg Oral BID   atorvastatin   80 mg Oral Daily   buPROPion   450 mg Oral Daily   citalopram   30 mg Oral Daily   divalproex   250 mg Oral q morning   And   divalproex   500 mg Oral QHS   famotidine   20 mg Oral Daily   isosorbide   mononitrate  30 mg Oral Daily   potassium chloride   10 mEq Oral Daily   risperiDONE   0.25 mg Oral QHS   sodium chloride  flush  3 mL Intravenous Q12H   Continuous Infusions:  diltiazem  (CARDIZEM ) infusion 12.5 mg/hr (03/25/24 0752)   PRN Meds: acetaminophen  **OR** [DISCONTINUED] acetaminophen , alum & mag hydroxide-simeth, ondansetron  **OR** ondansetron  (ZOFRAN ) IV, senna-docusate  Allergies:   Allergies[1]  Social History:   Social History   Socioeconomic History   Marital status: Married    Spouse name: Not on  file   Number of children: 2   Years of education: Masters   Highest education level: Not on file  Occupational History   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   No daily caffeine  use.   Right-handed.   Social Drivers of Health   Tobacco Use: Low Risk (03/25/2024)   Patient History    Smoking Tobacco Use: Never    Smokeless Tobacco Use: Never    Passive Exposure: Not on file  Financial Resource Strain: Not on file  Food Insecurity: No Food Insecurity (12/11/2023)   Epic    Worried About Programme Researcher, Broadcasting/film/video in the Last Year: Never true    Ran Out of Food in the Last Year: Never true  Transportation Needs: No Transportation Needs (12/11/2023)   Epic    Lack of Transportation (Medical): No    Lack of Transportation (Non-Medical): No  Physical Activity: Not on file  Stress: Not on file  Social Connections: Unknown (12/11/2023)   Social Connection and Isolation Panel    Frequency of Communication with Friends and Family: More than three times a week    Frequency of Social Gatherings with Friends and Family: More than three times a week    Attends Religious Services: Not  on file    Active Member of Clubs or Organizations: Not on file    Attends Banker Meetings: Not on file    Marital Status: Not on file  Intimate Partner Violence: Not At Risk (12/11/2023)   Epic    Fear of Current or Ex-Partner: No    Emotionally Abused: No    Physically Abused: No    Sexually Abused: No  Depression (PHQ2-9): Not on file  Alcohol Screen: Not on file  Housing: Low Risk (12/11/2023)   Epic    Unable to Pay for Housing in the Last Year: No    Number of Times Moved in the Last Year: 0    Homeless in the Last Year: No  Utilities: Not At Risk (12/11/2023)   Epic    Threatened with loss of utilities: No  Health Literacy: Not on file    Family History:   Family History  Problem Relation Age of Onset   Stroke Mother    Seizures Mother    Memory loss Mother    CAD Mother        had a stent   Hyperlipidemia Mother    Heart disease Mother    Thyroid  disease Mother    Cancer Father        Salivary Gland    Heart disease Father        2 silent MI's   Diabetes Father    Supraventricular tachycardia Sister    Hyperlipidemia Sister      ROS:  Please see the history of present illness.  All other ROS reviewed and negative.     Physical Exam/Data: Vitals:   03/25/24 0745 03/25/24 0800 03/25/24 0840 03/25/24 0850  BP: 100/80 111/83 133/88   Pulse: 65 80 83   Resp: 19 17 19    Temp:      TempSrc:  SpO2: 96% 95% 98% 98%   No intake or output data in the 24 hours ending 03/25/24 0956    03/22/2024    2:55 PM 01/04/2024    3:20 PM 12/12/2023    5:33 AM  Last 3 Weights  Weight (lbs) 262 lb 269 lb 269 lb 6.4 oz  Weight (kg) 118.842 kg 122.018 kg 122.2 kg     There is no height or weight on file to calculate BMI.  General:  Well nourished, well developed, in no acute distress.  Sitting upright in the bed HEENT: normal Neck: no JVD Vascular: Radial pulses 2+ bilaterally Cardiac:  normal S1, S2; RRR; no murmur  Lungs:  clear to auscultation  bilaterally, no wheezing, rhonchi or rales.  Normal breathing on room air Abd: soft, nontender Ext: no edema Musculoskeletal:  No deformities Skin: warm and dry  Neuro:  CNs 2-12 intact, no focal abnormalities noted Psych:  Normal affect   EKG:  The EKG was personally reviewed and demonstrates:  EKG showed a regular, wide-complex tachycardia with heart rate 136 bpm.  Appears to be SVT with underlying RBBB Telemetry:  Telemetry was personally reviewed and demonstrates: Telemetry reviewed.  Initially showed SVT/sinus tachycardia with heart rate in the 130s.  She then slowed to sinus rhythm with PACs and PVCs.  Now maintaining normal sinus rhythm with occasional ectopy  Relevant CV Studies: Cardiac Studies & Procedures   ______________________________________________________________________________________________     ECHOCARDIOGRAM  ECHOCARDIOGRAM COMPLETE 11/30/2019  Narrative ECHOCARDIOGRAM REPORT    Patient Name:   CLARITY CISZEK Solanki    Date of Exam: 11/30/2019 Medical Rec #:  [id number]     Height:       68.0 in Accession #:    [id number]    Weight:       274.0 lb Date of Birth:  1956-04-04     BSA:          2.336 m Patient Age:    64 years      BP:           134/86 mmHg Patient Gender: F             HR:           87 bpm. Exam Location:  [address]  Procedure: 3D Echo, 2D Echo, Cardiac Doppler, Color Doppler and Strain Analysis  Indications:    R06.00 Dyspnea on exertion  History:        Patient has prior history of Echocardiogram examinations, most recent 02/03/2017. Risk Factors:Hypertension, Dyslipidemia, Sleep Apnea and Obesity. Pre-diabetes.  Sonographer:    Marshia Rea RAMAN, RDCS Referring Phys: [id number] OLIVIA HERO [facility]  IMPRESSIONS   1. Left ventricular ejection fraction, by estimation, is 60 to 65%. The left ventricle has normal function. The left ventricle has no regional wall motion abnormalities. Left ventricular diastolic parameters are consistent with Grade I  diastolic dysfunction (impaired relaxation). The average left ventricular global longitudinal strain is -18.5 %. The global longitudinal strain is normal. 2. Right ventricular systolic function is normal. The right ventricular size is normal. There is normal pulmonary artery systolic pressure. 3. The mitral valve is normal in structure. No evidence of mitral valve regurgitation. No evidence of mitral stenosis. 4. The aortic valve is normal in structure. Aortic valve regurgitation is not visualized. No aortic stenosis is present. 5. The inferior vena cava is normal in size with greater than 50% respiratory variability, suggesting right atrial pressure of 3 mmHg.  Comparison(s): 02/03/17 EF 55-60%.  FINDINGS  Left Ventricle: Left ventricular ejection fraction, by estimation, is 60 to 65%. The left ventricle has normal function. The left ventricle has no regional wall motion abnormalities. The average left ventricular global longitudinal strain is -18.5 %. The global longitudinal strain is normal. The left ventricular internal cavity size was normal in size. There is no left ventricular hypertrophy. Left ventricular diastolic parameters are consistent with Grade I diastolic dysfunction (impaired relaxation). Normal left ventricular filling pressure.  Right Ventricle: The right ventricular size is normal. No increase in right ventricular wall thickness. Right ventricular systolic function is normal. There is normal pulmonary artery systolic pressure. The tricuspid regurgitant velocity is 2.58 m/s, and with an assumed right atrial pressure of 3 mmHg, the estimated right ventricular systolic pressure is 29.6 mmHg.  Left Atrium: Left atrial size was normal in size.  Right Atrium: Right atrial size was normal in size.  Pericardium: There is no evidence of pericardial effusion.  Mitral Valve: The mitral valve is normal in structure. Normal mobility of the mitral valve leaflets. Mild mitral annular  calcification. No evidence of mitral valve regurgitation. No evidence of mitral valve stenosis.  Tricuspid Valve: The tricuspid valve is normal in structure. Tricuspid valve regurgitation is trivial. No evidence of tricuspid stenosis.  Aortic Valve: The aortic valve is normal in structure. Aortic valve regurgitation is not visualized. No aortic stenosis is present.  Pulmonic Valve: The pulmonic valve was normal in structure. Pulmonic valve regurgitation is not visualized. No evidence of pulmonic stenosis.  Aorta: The aortic root is normal in size and structure.  Venous: The inferior vena cava is normal in size with greater than 50% respiratory variability, suggesting right atrial pressure of 3 mmHg.  IAS/Shunts: No atrial level shunt detected by color flow Doppler.   LEFT VENTRICLE PLAX 2D LVIDd:         4.40 cm  Diastology LVIDs:         3.20 cm  LV e' lateral:   10.70 cm/s LV PW:         0.70 cm  LV E/e' lateral: 4.6 LV IVS:        1.10 cm  LV e' medial:    5.00 cm/s LVOT diam:     2.20 cm  LV E/e' medial:  9.9 LV SV:         72 LV SV Index:   31       2D Longitudinal Strain LVOT Area:     3.80 cm 2D Strain GLS (A2C):   -23.3 % 2D Strain GLS (A3C):   -15.5 % 2D Strain GLS (A4C):   -16.7 % 2D Strain GLS Avg:     -18.5 %  3D Volume EF: 3D EF:        57 % LV EDV:       122 ml LV ESV:       57 ml LV SV:        69 ml  RIGHT VENTRICLE RV Basal diam:  3.80 cm RV S prime:     14.60 cm/s TAPSE (M-mode): 2.2 cm RVSP:           29.6 mmHg  LEFT ATRIUM             Index       RIGHT ATRIUM           Index LA diam:        4.50 cm 1.93 cm/m  RA Pressure: 3.00 mmHg LA Vol (A2C):   30.2 ml  12.93 ml/m RA Area:     16.60 cm LA Vol (A4C):   31.3 ml 13.40 ml/m RA Volume:   45.00 ml  19.26 ml/m LA Biplane Vol: 30.7 ml 13.14 ml/m AORTIC VALVE LVOT Vmax:   96.00 cm/s LVOT Vmean:  62.500 cm/s LVOT VTI:    0.189 m  AORTA Ao Root diam: 3.20 cm Ao Asc diam:  3.30 cm  MITRAL VALVE                TRICUSPID VALVE TR Peak grad:   26.6 mmHg TR Vmax:        258.00 cm/s MV E velocity: 49.70 cm/s  Estimated RAP:  3.00 mmHg MV A velocity: 76.70 cm/s  RVSP:           29.6 mmHg MV E/A ratio:  0.65 SHUNTS Systemic VTI:  0.19 m Systemic Diam: 2.20 cm  Wilbert Bihari MD Electronically signed by Wilbert Bihari MD Signature Date/Time: 11/30/2019/2:43:58 PM    Final      CT SCANS  CT CORONARY FRACTIONAL FLOW RESERVE DATA PREP 12/05/2019  Narrative EXAM: FFRCT ANALYSIS  FINDINGS: FFRct analysis was performed on the original cardiac CT angiogram dataset. Diagrammatic representation of the FFRct analysis is provided in a separate PDF document in PACS. This dictation was created using the PDF document and an interactive 3D model of the results. 3D model is not available in the EMR/PACS. Normal FFR range is >0.80.  1. Left Main: No significant stenosis. LM FFR = 0.99.  2. LAD: No significant stenosis. Proximal FFR = 0.99, Mid FFR = 0.97, Distal FFR = 0.93.  3. LCX: No significant stenosis. Proximal FFR = 0.98, Mid FFR = 0.97, Distal FFR = 0.95  4. OM1: No significant stenosis. Proximal FFR = 0.98, Mid to distal FFR = 0.87.  5. RCA: No significant stenosis. Proximal FFR = 0.98, Mid FFR = 0.95, Distal FFR = 0.90.  IMPRESSION:: IMPRESSION: 1. Coronary CT FFR flow analysis demonstrates possible flow limiting lesion in the diagonal #1 branch (FFR 0.79 mid and 0.76 distal). Normal flow in RCA, LCX and LAD.  2.  Recommend medical management.  Wilbert Bihari   Electronically Signed By: Wilbert Bihari On: 12/06/2019 12:46   CT SCANS  CT CORONARY MORPH W/CTA COR W/SCORE 12/04/2019  Addendum 12/04/2019  5:41 PM ADDENDUM REPORT: 12/04/2019 17:39  CLINICAL DATA:  52F with SVT, hypertension, hyperlipidemia, untreated OSA, and family history of CAD with exertional dyspnea needing pre-operative clearance.  EXAM: Cardiac/Coronary  CT  TECHNIQUE: The patient was  scanned on a [employer].  FINDINGS: A 120 kV prospective scan was triggered in the descending thoracic aorta at 111 HU's. Axial non-contrast 3 mm slices were carried out through the heart. The data set was analyzed on a dedicated work station and scored using the Agatson method. Gantry rotation speed was 250 msecs and collimation was .6 mm. No beta blockade and 0.8 mg of sl NTG was given. The 3D data set was reconstructed in 5% intervals of the 67-82 % of the R-R cycle. Diastolic phases were analyzed on a dedicated work station using MPR, MIP and VRT modes. The patient received 80 cc of contrast.  Aorta: Normal size. Ascending aorta 3.1 cm. minimal calcification of the aortic root. No dissection.  Aortic Valve:  Trileaflet.  No calcification.  Coronary Arteries:  Normal coronary origin.  Right dominance.  RCA is a large dominant artery that gives rise to PDA and PLVB. There is minimal (<25%) mixed plaque  proximally. The PDA and distal RCA are not well-visualized.  Left main is a large artery that gives rise to LAD and LCX arteries.  LAD is a large vessel that has no plaque. There is mild (25-49%) mixed plaque proximally and moderate (50-69%) mixed plaque in the mid LAD. There is severe (>70%) plaque in the distal LAD. There is a large D1 with diffuse, severe (>70%) mixed plaque.  LCX is a non-dominant artery that gives rise to one large OM1 branch. There is no plaque.  Other findings:  Normal pulmonary vein drainage into the left atrium.  Normal let atrial appendage without a thrombus.  Normal size of the pulmonary artery.  IMPRESSION: 1. Coronary calcium  score of 156. This was 85th percentile for age and sex matched control.  2. Normal coronary origin with right dominance.  3. Moderate (50-69%) proximal and severe (>70%) mixed plaque in the LAD and D1. CAD-RADS 4.  4.  Consider cardiac catheterization.  Annabella Scarce, MD   Electronically  Signed By: Annabella Scarce On: 12/04/2019 17:39  Narrative EXAM: OVER-READ INTERPRETATION  CT CHEST  The following report is an over-read performed by radiologist Dr. Toribio Aye of [facility] on 12/04/2019. This over-read does not include interpretation of cardiac or coronary anatomy or pathology. The coronary calcium  score/coronary CTA interpretation by the cardiologist is attached.  COMPARISON:  None.  FINDINGS: Aortic atherosclerosis. Multiple small pulmonary nodules are noted throughout the lungs bilaterally, largest of which is in the anterior aspect of the right upper lobe (axial image 8 of series 15) measuring 5 x 3 mm (mean diameter 4 mm). Within the visualized portions of the thorax there are no suspicious appearing pulmonary nodules or masses, there is no acute consolidative airspace disease, no pleural effusions, no pneumothorax and no lymphadenopathy. Moderate to large hiatal hernia. Visualized portions of the upper abdomen are unremarkable. There are no aggressive appearing lytic or blastic lesions noted in the visualized portions of the skeleton.  IMPRESSION: 1. Multiple small 2-4 mm pulmonary nodules in the lungs bilaterally, nonspecific, but statistically likely benign. No follow-up needed if patient is low-risk (and has no known or suspected primary neoplasm). Non-contrast chest CT can be considered in 12 months if patient is high-risk. This recommendation follows the consensus statement: Guidelines for Management of Incidental Pulmonary Nodules Detected on CT Images: From the Fleischner Society 2017; Radiology 2017; 284:228-243. 2. Moderate to large hiatal hernia.  Electronically Signed: By: Toribio Aye M.D. On: 12/04/2019 14:58     ______________________________________________________________________________________________       Laboratory Data: High Sensitivity Troponin:  No results for input(s): TROPONINIHS in the  last 720 hours.  Recent Labs  Lab 03/22/24 1538 03/22/24 2018 03/24/24 1744  TRNPT 19 22* 20*      Chemistry Recent Labs  Lab 03/22/24 1538 03/24/24 1744 03/25/24 0614  NA 140 143 141  K 5.0 3.9 4.3  CL 104 104 106  CO2 28 29 26   GLUCOSE 71 87 130*  BUN 13 8 11   CREATININE 0.91 0.72 0.80  CALCIUM  8.4* 8.6* 8.5*  MG 2.2 2.1  --   GFRNONAA >60 >60 >60  ANIONGAP 9 10 9     Recent Labs  Lab 03/22/24 1538 03/24/24 1744  PROT 5.7* 6.2*  ALBUMIN 3.6 4.0  AST 51* 27  ALT 24 24  ALKPHOS 75 82  BILITOT 0.5 0.4   Lipids No results for input(s): CHOL, TRIG, HDL, LABVLDL, LDLCALC, CHOLHDL in the last 168 hours.  Hematology Recent Labs  Lab 03/22/24 1538 03/24/24 1744 03/25/24 0614  WBC 8.9 10.3 9.6  RBC 4.89 5.32* 5.10  HGB 15.1* 16.4* 15.7*  HCT 46.0 50.6* 48.0*  MCV 94.1 95.1 94.1  MCH 30.9 30.8 30.8  MCHC 32.8 32.4 32.7  RDW 13.9 13.7 14.0  PLT 243 241 215   Thyroid   Recent Labs  Lab 03/25/24 0614  TSH 3.180    BNPNo results for input(s): BNP, PROBNP in the last 168 hours.  DDimer No results for input(s): DDIMER in the last 168 hours.  Radiology/Studies:  DG CHEST PORT 1 VIEW Result Date: 03/24/2024 EXAM: 1 VIEW(S) XRAY OF THE CHEST 03/24/2024 08:59:00 PM COMPARISON: Comparison with 03/22/2024. CLINICAL HISTORY: Atrial fibrillation with RVR (HCC). FINDINGS: LUNGS AND PLEURA: Lungs are clear. No pleural effusion. No pneumothorax. HEART AND MEDIASTINUM: Heart size and pulmonary vascularity are normal. Mediastinal contours appear intact. Calcification of the aorta. BONES AND SOFT TISSUES: Degenerative changes in the spine and shoulders. No acute osseous abnormality. IMPRESSION: 1. No acute cardiopulmonary abnormality. Electronically signed by: Elsie Gravely MD 03/24/2024 09:11 PM EST RP Workstation: [id number]   DG Chest Portable 1 View Result Date: 03/22/2024 CLINICAL DATA:  Chest pain. EXAM: PORTABLE CHEST 1 VIEW COMPARISON:  Chest  radiograph dated 01/13/2024 FINDINGS: Left lung base atelectasis/scarring. No focal consolidation, pleural effusion, pneumothorax. Stable cardiac silhouette. Poorly visualized hiatal hernia. Osteopenia with degenerative changes of the spine and left shoulder. No acute osseous pathology. IMPRESSION: 1. No active disease. 2. Hiatal hernia. Electronically Signed   By: Vanetta Chou M.D.   On: 03/22/2024 15:53     Assessment and Plan:  History of SVT Tachycardia - Patient has known history of SVT.  In the past has required adenosine  on at least 2 occasions.  Estimates that she has had 2-3 symptomatic episodes in the past.  She was previously unable to tolerate maintenance metoprolol  or diltiazem  due to worsening depression - Seen on 12/17 with SVT that broke with adenosine .  Presented again on 12/19 with palpitations.  Found to be in a regular, wide-complex tachycardia concerning for SVT with underlying RBBB.  Once started on Dilt, heart rate improved and patient was in normal sinus rhythm with very frequent PVCs and PACs. No afib  noted  - Patient tells me that she has been sick with a head cold and sinus infection.  Suspect that SVT is being triggered by her infection. - Discussed using as needed diltiazem  and outpatient follow-up with EP.  Patient is in agreement with this plan.  She would prefer to avoid being on daily diltiazem  or beta-blockers as these have made her depression worse in the past - Echo pending - Okay to stop Eliquis   CAD - Patient previously had coronary CTA in 2021 that showed coronary calcium  score 156 (80th percentile), moderate proximal and severe mixed plaque in the LAD and D1.  FFR showed possible flow-limiting lesion in diagonal but there was normal flow in RCA, left circumflex, LAD.  Recommended medical management - Patient denies chest pain.  When in SVT/tachycardia she occasionally gets jaw pain - High-sensitivity troponin 20.  Had been 19> 22 when seen on 12/17 -  Plan for outpatient coronary CTA versus cardiac PET - Continue aspirin  81 mg daily, Lipitor  80 mg daily, Imdur  30 mg daily  Hypertension - Continue Imdur  30 mg daily  Hyperlipidemia - Lipid profile in 05/2023 showed LDL 62 - Continue Lipitor  80 mg daily   Risk Assessment/Risk Scores:  For questions or updates, please contact [facility]  Please consult [email or web address] for contact info under      Signed, Rollo FABIENE Louder, PA-C  03/25/2024 9:56 AM     [1]  Allergies Allergen Reactions   Gabapentin  Other (See Comments)    Weakness and muscle contractions   Metoprolol  Other (See Comments)    INCREASED DEPRESSION    Promethazine -Dm Other (See Comments)    Other Reaction(s): nausea, dizziness   Trazodone  Other (See Comments)    Exacerbates depression   Sulfa Antibiotics Other (See Comments)    Family History; unknown reaction per patient.   "

## 2024-03-25 NOTE — ED Provider Notes (Signed)
 " [facility] Provider Note   CSN: [id number] Arrival date & time: 03/24/24  1648     Patient presents with: No chief complaint on file.   Lauren Huffman is a 67 y.o. female.   Patient returns to the ED after being seen on 12/17 for similar presentation of palpitations.  During 12/17 visit, patient was in SVT and received adenosine . At discharge 12/17 patient was told to decrease tea intake.  Patient had 1 cup of coffee this morning, around noon she started having indigestion, she did deep breathing, she took Mylanta, she drink water , and when her symptoms persisted she came to the ED. Today, patient's heart rate was 130-140 en route with blood pressure 132/88.  Took aspirin  before arrival.  Denies chest pain, palpitations.  Endorsed shortness of breath, jaw pain, metallic indigestion different from 12/17 which was more burning.         Prior to Admission medications  Medication Sig Start Date End Date Taking? Authorizing Provider  aspirin  EC 81 MG tablet Take 1 tablet (81 mg total) by mouth daily. Swallow whole. 12/06/19   Shlomo Wilbert SAUNDERS, MD  atorvastatin  (LIPITOR ) 80 MG tablet Take 1 tablet (80 mg total) by mouth daily. 01/21/21   Shlomo Wilbert SAUNDERS, MD  buPROPion  (WELLBUTRIN  XL) 150 MG 24 hr tablet Take 450 mg by mouth daily. 04/08/23   [provider]  Calcium -Phosphorus-Vitamin D  (CALCIUM  GUMMIES PO) Take 500 mg by mouth in the morning and at bedtime.    [provider]  cholecalciferol  (VITAMIN D3) 25 MCG (1000 UNIT) tablet Take 1,000 Units by mouth daily.    [provider]  citalopram  (CELEXA ) 20 MG tablet Take 30 mg by mouth daily. 10/11/23   [provider]  divalproex  (DEPAKOTE  ER) 250 MG 24 hr tablet Take 250-500 mg by mouth in the morning and at bedtime. Take one tablet (250mg ) by mouth in the morning, and take two tablets (500mg ) by mouth at bedtime. 10/19/23   [provider]  ELDERBERRY PO  Take 1 each by mouth at bedtime.    [provider]  fexofenadine (ALLEGRA ALLERGY) 180 MG tablet Take 180 mg by mouth at bedtime. 05/12/23   [provider]  folic acid  (FOLVITE ) 1 MG tablet Take 2 mg by mouth daily.     [provider]  furosemide (LASIX) 20 MG tablet Take 20 mg by mouth daily. 08/24/23   [provider]  isosorbide  mononitrate (IMDUR ) 30 MG 24 hr tablet Take 1 tablet (30 mg total) by mouth daily. 01/04/24   West, Katlyn D, NP  Loperamide -Simethicone  2-125 MG TABS Take 1 tablet by mouth daily as needed.    [provider]  Multiple Vitamins-Minerals (MULTIVITAMIN GUMMIES ADULT PO) Take 2 each by mouth at bedtime.    [provider]  naproxen sodium (ALEVE) 220 MG tablet Take 220 mg by mouth daily as needed (pain).    [provider]  nystatin powder Apply 1 Application topically 2 (two) times daily as needed (yeast in armpits). 11/09/22   [provider]  potassium chloride  (KLOR-CON  M) 10 MEQ tablet Take 10 mEq by mouth daily. 08/24/23   [provider]  Probiotic Product (ALIGN) CHEW Chew 1 each by mouth at bedtime.    [provider]  risperiDONE  (RISPERDAL ) 0.25 MG tablet Take 0.25 mg by mouth at bedtime. 11/15/23   [provider]  Semaglutide, 2 MG/DOSE, (OZEMPIC, 2 MG/DOSE,) 8 MG/3ML SOPN  Inject 2 mg into the skin once a week. Inject on Sunday 06/24/22   [provider]  TURMERIC PO Take 1 capsule by mouth at bedtime.    [provider]    Allergies: Cartia  xt [diltiazem  hcl er beads], Gabapentin , Metoprolol , Promethazine -dm, Trazodone , Diltiazem , and Sulfa antibiotics    Review of Systems  Updated Vital Signs BP (!) 170/84 (BP Location: Right Arm)   Pulse 93   Temp 98.4 F (36.9 C) (Oral)   Resp 20   SpO2 98%   Physical Exam Constitutional:      Appearance: She is obese. She is diaphoretic.  Cardiovascular:     Rate and Rhythm: Normal rate and regular  rhythm.     Pulses: Normal pulses.     Heart sounds: Normal heart sounds. No murmur heard.    No friction rub. No gallop.  Pulmonary:     Effort: Pulmonary effort is normal.     Breath sounds: Normal breath sounds. No stridor. No wheezing, rhonchi or rales.  Abdominal:     General: Bowel sounds are normal.     Palpations: Abdomen is soft.     Tenderness: There is no abdominal tenderness.  Skin:    General: Skin is warm.  Neurological:     Mental Status: She is alert.     (all labs ordered are listed, but only abnormal results are displayed) Labs Reviewed  COMPREHENSIVE METABOLIC PANEL WITH GFR - Abnormal; Notable for the following components:      Result Value   Calcium  8.6 (*)    Total Protein 6.2 (*)    All other components within normal limits  CBC WITH DIFFERENTIAL/PLATELET - Abnormal; Notable for the following components:   RBC 5.32 (*)    Hemoglobin 16.4 (*)    HCT 50.6 (*)    All other components within normal limits  TROPONIN T, HIGH SENSITIVITY - Abnormal; Notable for the following components:   Troponin T High Sensitivity 20 (*)    All other components within normal limits  MAGNESIUM   HIV ANTIBODY (ROUTINE TESTING W REFLEX)  TSH  BASIC METABOLIC PANEL WITH GFR  CBC    EKG: EKG Interpretation Date/Time:  Friday March 24 2024 17:43:59 EST Ventricular Rate:  136 PR Interval:    QRS Duration:  140 QT Interval:  348 QTC Calculation: 524 R Axis:   217  Text Interpretation: Atrial fibrillation Right bundle branch block afib new since previous Confirmed by Patt Alm DEL [phone]) on 03/24/2024 5:58:04 PM  Radiology: [facility] CHEST PORT 1 VIEW Result Date: 03/24/2024 EXAM: 1 VIEW(S) XRAY OF THE CHEST 03/24/2024 08:59:00 PM COMPARISON: Comparison with 03/22/2024. CLINICAL HISTORY: Atrial fibrillation with RVR (HCC). FINDINGS: LUNGS AND PLEURA: Lungs are clear. No pleural effusion. No pneumothorax. HEART AND MEDIASTINUM: Heart size and pulmonary vascularity are  normal. Mediastinal contours appear intact. Calcification of the aorta. BONES AND SOFT TISSUES: Degenerative changes in the spine and shoulders. No acute osseous abnormality. IMPRESSION: 1. No acute cardiopulmonary abnormality. Electronically signed by: Elsie Gravely MD 03/24/2024 09:11 PM EST RP Workstation: [id number]     Procedures   Medications Ordered in the ED  diltiazem  (CARDIZEM ) 125 mg in dextrose  5% 125 mL (1 mg/mL) infusion (15 mg/hr Intravenous Rate/Dose Change 03/24/24 1958)  apixaban  (ELIQUIS ) tablet 5 mg (5 mg Oral Given 03/24/24 2114)  sodium chloride  flush (NS) 0.9 % injection 3 mL (3 mLs Intravenous Not Given 03/24/24 2106)  acetaminophen  (TYLENOL ) tablet 650 mg (has no administration in time range)  Or  acetaminophen  (TYLENOL ) suppository 650 mg (has no administration in time range)  ondansetron  (ZOFRAN ) tablet 4 mg (has no administration in time range)    Or  ondansetron  (ZOFRAN ) injection 4 mg (has no administration in time range)  senna-docusate (Senokot-S) tablet 1 tablet (has no administration in time range)  atorvastatin  (LIPITOR ) tablet 80 mg (has no administration in time range)  buPROPion  (WELLBUTRIN  XL) 24 hr tablet 450 mg (has no administration in time range)  citalopram  (CELEXA ) tablet 30 mg (has no administration in time range)  isosorbide  mononitrate (IMDUR ) 24 hr tablet 30 mg (has no administration in time range)  risperiDONE  (RISPERDAL ) tablet 0.25 mg (0.25 mg Oral Given 03/24/24 2227)  potassium chloride  (KLOR-CON  M) CR tablet 10 mEq (10 mEq Oral Given 03/24/24 2217)  divalproex  (DEPAKOTE  ER) 24 hr tablet 250 mg (has no administration in time range)    And  divalproex  (DEPAKOTE  ER) 24 hr tablet 500 mg (500 mg Oral Given 03/24/24 2217)  sodium chloride  0.9 % bolus 1,000 mL (0 mLs Intravenous Stopped 03/24/24 1937)  diltiazem  (CARDIZEM ) injection 10 mg (10 mg Intravenous Given 03/24/24 1807)  alum & mag hydroxide-simeth (MAALOX/MYLANTA) 200-200-20  MG/5ML suspension 15 mL (15 mLs Oral Given 03/24/24 1958)                                    Medical Decision Making This patient is a 68 y.o. female who presents to the ED for concern of palpitations.  Upon evaluation, EKG demonstrated A-fib with RVR.  Past Medical History / Co-morbidities / Social History: CAD, SVT, hypertension, prediabetes, hyperlipidemia, obesity, venous insufficiency, OSA, mood disorder  Physical Exam: Physical exam performed. The pertinent findings include:  Tachycardic, regular rhythm.  2+ radial pulses, diaphoretic  Lab Tests: I ordered, and personally interpreted labs.  The pertinent results include:   -Hemoglobin 16.4 -CMP: Sodium 143, potassium 3.9 -Troponin 20 -Magnesium  2.1   Imaging Studies: I ordered imaging studies including and I independently visualized and interpreted imaging - I agree with the radiologist interpretation. Below are the tests ordered and my interpretations: -Chest x-ray: No acute cardiopulmonary abnormality  EKG/Cardiac Monitoring:  My attending physician Dr. Patt viewed and interpreted the EKG which showed an underlying rhythm of: A-fib with RVR. I agree with this interpretation.   Medications: I ordered medication including -Diltiazem  injection 10 mg IV for rate control -Sodium chloride  1 L bolus -Mylanta for indigestion  Consultations Obtained: I requested consultation with the hospitalist,  and discussed lab and imaging findings as well as pertinent plan - they recommend: Admission for A-fib with RVR   MDM: Patient presented with newly diagnosed A-fib with RVR (heart rate 140s).  Received diltiazem  for rate control with improvement of heart rate to 60s.  Chadsvasc score 4--stroke risk 4.8%.  Patient is hemodynamically stable, no obvious signs of electrolyte abnormalities, no signs of infection, euvolemic.  Recommending admission for further workup of new diagnosis of A-fib with RVR.  I discussed this case with my  attending physician Dr. Patt who cosigned this note including patient's presenting symptoms, physical exam, and planned diagnostics and interventions. Attending physician stated agreement with plan or made changes to plan which were implemented.      Amount and/or Complexity of Data Reviewed Labs: ordered.  Risk OTC drugs. Prescription drug management. Decision regarding hospitalization.        Final diagnoses:  Atrial fibrillation with rapid ventricular response (HCC)  ED Discharge Orders          Ordered    Amb referral to AFIB Clinic        03/24/24 2048               Benuel Braun, DO 03/25/24 9972  "

## 2024-03-26 ENCOUNTER — Other Ambulatory Visit (HOSPITAL_COMMUNITY): Payer: Self-pay

## 2024-03-26 DIAGNOSIS — I4891 Unspecified atrial fibrillation: Secondary | ICD-10-CM | POA: Diagnosis not present

## 2024-03-26 MED ORDER — DILTIAZEM HCL 30 MG PO TABS
30.0000 mg | ORAL_TABLET | Freq: Four times a day (QID) | ORAL | 1 refills | Status: AC | PRN
  Filled 2024-03-26: qty 30, 8d supply, fill #0

## 2024-03-26 MED ORDER — LOPERAMIDE HCL 2 MG PO CAPS
2.0000 mg | ORAL_CAPSULE | ORAL | Status: DC | PRN
  Administered 2024-03-26: 2 mg via ORAL
  Filled 2024-03-26: qty 1

## 2024-03-26 NOTE — Progress Notes (Signed)
 DISCHARGE NOTE HOME Lauren Huffman to be discharged Home per MD order. Discussed prescriptions and follow up appointments with the patient. Prescriptions given to patient; medication list explained in detail. Patient verbalized understanding.  Talked about stroke risk with condition, patient well educated.  Skin clean, dry and intact without evidence of skin break down, no evidence of skin tears noted. IV catheter discontinued intact. Site without signs and symptoms of complications. Dressing and pressure applied. Pt denies pain at the site currently. No complaints noted.  Patient free of lines, drains, and wounds.   An After Visit Summary (AVS) was printed and given to the patient. Patient escorted via wheelchair, and discharged home via private auto.  Peyton SHAUNNA Pepper, RN

## 2024-03-26 NOTE — Care Management Obs Status (Signed)
 MEDICARE OBSERVATION STATUS NOTIFICATION   Patient Details  Name: Lauren Huffman MRN: [id number] Date of Birth: [birth year]   Medicare Observation Status Notification Given:  Yes    Jamal Pavon G., RN 03/26/2024, 8:35 AM

## 2024-03-26 NOTE — Progress Notes (Signed)
"  °  Progress Note Patient Name: Lauren Huffman Date of Encounter: 03/26/2024 [facility] Cardiologist: Wilbert Bihari, MD   Interval Summary   No events overnight and she denies any chest palpitations or dyspnea.  Vital Signs Vitals:   03/25/24 2102 03/25/24 2356 03/26/24 0555 03/26/24 0730  BP: 114/88 123/61 (!) 155/84 (!) 139/92  Pulse: 81 81 62 80  Resp: 20 18 16 18   Temp: 98.4 F (36.9 C) 98.4 F (36.9 C) 97.8 F (36.6 C) 98.6 F (37 C)  TempSrc: Oral Oral Oral Oral  SpO2: 95% 96% 96% 95%  Weight:      Height:        Intake/Output Summary (Last 24 hours) at 03/26/2024 1052 Last data filed at 03/26/2024 0846 Gross per 24 hour  Intake 960 ml  Output --  Net 960 ml      03/25/2024    2:59 PM 03/22/2024    2:55 PM 01/04/2024    3:20 PM  Last 3 Weights  Weight (lbs) 264 lb 1.6 oz 262 lb 269 lb  Weight (kg) 119.795 kg 118.842 kg 122.018 kg      Telemetry/ECG  NSR - Personally Reviewed  Physical Exam  GEN: No acute distress.   Neck: No JVD Cardiac: RRR, no murmurs, rubs, or gallops.  Respiratory: Clear to auscultation bilaterally. GI: Soft, nontender, non-distended  Lauren: No edema  Assessment & Plan  Lauren Huffman is a 38 yoF with Hx of SVT, CAD who presented with recurrent episodes of SVT (September 2025, December 17th requiring adenosine , and December 19th) in setting of sinus infectious over last 3 weeks. Her recurrent episodes are likely due to recent sinus infection. I explained that given that she did have some intolerance to metop/dilt in the past (worsened depression), we can consider doing Dilt as needed with low threshold to consider ablation if medication is not tolerated. TTE with EF 60-65%.    #SVT #Recent sinus infection #CAD - Remains asymptomatic and no evidence of SVT on telemetry. - Okay to do Dilt 30 mg PRN - Regarding her known CAD (LAD, D1), patient has been asymptomatic and thus will defer additional testing to outpatient cardiologist.  - We  will sign off and arrange follow up.   For questions or updates, please contact [facility] Please consult [email or web address] for contact info under  Signed, Joelle VEAR Ren Donley, MD  "

## 2024-03-26 NOTE — Discharge Summary (Signed)
 "  DISCHARGE SUMMARY  Lauren Huffman  MR#: [id number]  DOB:02-03-1956  Date of Admission: 03/24/2024 Date of Discharge: 03/26/2024  Attending Physician:Lauren Vanwyk ONEIDA Moores, MD  Patient's ERE:FrWzpoo, [name], MD  Disposition: D/C home   Follow-up Appts:  Follow-up Information     Lauren [name], MD Follow up in 2 week(s).   Specialty: Family Medicine Contact information: [address] [place] [address] [phone]                  Discharge Diagnoses: Acute SVT w/ hx of Paroxysmal SVT Sinus infection POA CAD HTN HLD Depression/anxiety OSA intolerant of CPAP Obesity - BMI 39.8  Initial presentation: 68 year old with a history of paroxysmal SVT, CAD, HTN, HLD, depression/anxiety, obesity, and OSA not using CPAP who presented to the ER 12/19 with complaint of palpitations.  She had been treated for an episode of SVT 12/17 with adenosine  with successful conversion to sinus tachycardia.  Her symptoms recurred, and she reported to the ER where she was found to be in atrial fibrillation with RVR.   Hospital Course:  Acute SVT w/ hx of Paroxysmal SVT magnesium  and potassium favorable - TSH normal - converted back to NSR w/ IV diltiazem  - Eliquis  discontinued as Cards does not feel this is Afib bur rather paroxysmal SVT - to continue prn diltiazem  for future episodes of SVT - f/u w/ Cardiology as outpt as advised - TTE this admit noted EF 60-65% w/ no WMA and normal diastolic fxn   Sinus infection POA Was on meclizine  and oral augmentin  as an outpt - resumed to complete course    CAD continue Imdur  ASA and atorvastatin    HTN Bp well controlled    HLD Continue atorvastatin    Depression/anxiety Continue usual bupropion  citalopram  Risperdal  and Depakote    OSA intolerant of CPAP Patient has been counseled as to connection between untreated OSA and atrial fibrillation   Obesity - BMI 39.8  Allergies as of 03/26/2024       Reactions   Gabapentin  Other  (See Comments)   Weakness and muscle contractions   Metoprolol  Other (See Comments)   INCREASED DEPRESSION    Promethazine -dm Other (See Comments)   Other Reaction(s): nausea, dizziness   Trazodone  Other (See Comments)   Exacerbates depression   Lactose Intolerance (gi)    Sulfa Antibiotics Other (See Comments)   Family History; unknown reaction per patient.        Medication List     STOP taking these medications    furosemide 20 MG tablet Commonly known as: LASIX   potassium chloride  10 MEQ tablet Commonly known as: KLOR-CON  M       TAKE these medications    Align 10 MG Caps Take 1 each by mouth every evening.   Allegra Allergy 180 MG tablet Generic drug: fexofenadine Take 180 mg by mouth at bedtime.   amoxicillin -clavulanate 875-125 MG tablet Commonly known as: AUGMENTIN  Take 1 tablet by mouth 2 (two) times daily. For 10 days   aspirin  EC 81 MG tablet Take 1 tablet (81 mg total) by mouth daily. Swallow whole.   atorvastatin  80 MG tablet Commonly known as: LIPITOR  Take 1 tablet (80 mg total) by mouth daily.   busPIRone 10 MG tablet Commonly known as: BUSPAR Take 10 mg by mouth daily.   CALCIUM  GUMMIES PO Take 500 mg by mouth in the morning and at bedtime.   citalopram  20 MG tablet Commonly known as: CELEXA  Take 30 mg by mouth daily.   dextromethorphan -guaiFENesin   30-600 MG 12hr tablet Commonly known as: MUCINEX  DM Take 1 tablet by mouth 2 (two) times daily.   diltiazem  30 MG tablet Commonly known as: CARDIZEM  Take 1 tablet (30 mg total) by mouth every 6 (six) hours as needed (HR sustaining >110 BPM).   divalproex  250 MG 24 hr tablet Commonly known as: DEPAKOTE  ER Take 250-500 mg by mouth in the morning and at bedtime. Take one tablet (250mg ) by mouth in the morning, and take two tablets (500mg ) by mouth at bedtime.   ELDERBERRY PO Take 1 each by mouth at bedtime.   famotidine  10 MG tablet Commonly known as: PEPCID  Take 10 mg by mouth  daily.   folic acid  1 MG tablet Commonly known as: FOLVITE  Take 2 mg by mouth daily.   isosorbide  mononitrate 30 MG 24 hr tablet Commonly known as: IMDUR  Take 1 tablet (30 mg total) by mouth daily.   Loperamide -Simethicone  2-125 MG Tabs Take 1 tablet by mouth daily as needed.   meclizine  25 MG tablet Commonly known as: ANTIVERT  Take 25 mg by mouth 3 (three) times daily. For 7 days   MULTIVITAMIN GUMMIES ADULT PO Take 2 each by mouth at bedtime.   naproxen sodium 220 MG tablet Commonly known as: ALEVE Take 220 mg by mouth daily as needed (pain).   nystatin powder Apply 1 Application topically 2 (two) times daily as needed (yeast in armpits).   ondansetron  4 MG disintegrating tablet Commonly known as: ZOFRAN -ODT Take 4 mg by mouth daily as needed for nausea.   Ozempic (2 MG/DOSE) 8 MG/3ML Sopn Generic drug: Semaglutide (2 MG/DOSE) Inject 2 mg into the skin every Wednesday.   risperiDONE  0.25 MG tablet Commonly known as: RISPERDAL  Take 0.25 mg by mouth at bedtime.   TURMERIC PO Take 1 capsule by mouth at bedtime.   Wellbutrin  XL 150 MG 24 hr tablet Generic drug: buPROPion  Take 450 mg by mouth daily.        Day of Discharge BP (!) 139/92 (BP Location: Right Arm)   Pulse 80   Temp 98.6 F (37 C) (Oral)   Resp 18   Ht 5' 8 (1.727 m)   Wt 119.8 kg   SpO2 95%   BMI 40.16 kg/m   Physical Exam: General: No acute respiratory distress Lungs: Clear to auscultation bilaterally without wheezes or crackles Cardiovascular: Regular rate and rhythm without murmur gallop or rub normal S1 and S2 Abdomen: Nontender, nondistended, soft, bowel sounds positive, no rebound, no ascites, no appreciable mass Extremities: No significant cyanosis, clubbing, or edema bilateral lower extremities  Basic Metabolic Panel: Recent Labs  Lab 03/22/24 1538 03/24/24 1744 03/25/24 0614  NA 140 143 141  K 5.0 3.9 4.3  CL 104 104 106  CO2 28 29 26   GLUCOSE 71 87 130*  BUN 13 8 11    CREATININE 0.91 0.72 0.80  CALCIUM  8.4* 8.6* 8.5*  MG 2.2 2.1  --     CBC: Recent Labs  Lab 03/22/24 1538 03/24/24 1744 03/25/24 0614  WBC 8.9 10.3 9.6  NEUTROABS 4.9 5.7  --   HGB 15.1* 16.4* 15.7*  HCT 46.0 50.6* 48.0*  MCV 94.1 95.1 94.1  PLT 243 241 215    Time spent in discharge (includes decision making & examination of pt): >30 minutes  03/26/2024, 11:31 AM   Reyes IVAR Moores, MD [facility]  [phone]      "

## 2024-04-02 NOTE — Progress Notes (Unsigned)
 "  Cardiology Office Note    Date:  04/04/2024  ID:  [name], [name] 1955/07/26, MRN [id number] PCP:  Lauren Harvey, MD  Cardiologist:  Lauren Bihari, MD  Electrophysiologist:  None   Chief Complaint: Follow up for SVT and CAD   History of Present Illness: .   Lauren Huffman is a 68 y.o. female with visit-pertinent history of carpal tunnel, hypertension, hyperlipidemia, depression, anxiety, sleep apnea, varicose veins s/p ablation.   Patient previously seen in 2018 for evaluation of SVT.  Patient had been evaluated in 01/2017 in the ER with palpitations, dizziness, lightheadedness and nausea.  In the ER she was found to be in SVT with heart rate of 157 bpm.  She was given 6 mg of adenosine  with no effect then chemical conversion to normal sinus rhythm with 12 mg of adenosine .  She was started on diltiazem  120 mg daily and lisinopril  was stopped.  She was seen in clinic on 01/21/2017 and reported brief episodes of heart racing for the past 5 years however this always resolves on its own.  Event monitor was recommended however patient declined due to cost.  Echo was basically normal.   Patient was seen in 11/2019 for dyspnea on exertion.  Echo on 11/30/2019 indicated normal LVEF 60 to 65%, G1 DD.  Coronary CTA on 12/04/2019 indicated coronary calcium  score of 156, 85th percentile for age and sex matched control, moderate 50 to 69% proximal and greater than 70% mixed plaque in the LAD and diagonal.  Show there was obstructive plaque in mid diagonal branch but main epicardial arteries had normal flow start on aspirin  80 mg daily, Imdur  30 mg daily and changed her atorvastatin  to 40 mg daily.   Patient was seen in follow-up by Dr. Bihari on 01/21/2021, she was doing well at that time.  Patient was last seen in clinic on 03/11/2022 by Lauren Fryer, PA.  Patient was doing well at that time and denied any significant plaints, noted some mild shortness of breath when climbing stairs when holding something.    Patient recently admitted with pneumonia, found to be in SVT vs sinus tachycardia, resolved with gentle hydration.  CTA chest was negative for PE but showed scattered ground glass opacity, was started on IV Rocephin  and doxycycline .  She initially required supplemental oxygen however was weaned off this prior to discharge.  She was discharged in stable condition on 12/12/2023.  She is closely following with her PCP.  Patient was seen in clinic on 01/04/2024, she remained stable from a cardiac standpoint and denied any chest pain, shortness of breath or feelings of palpitations.  Echocardiogram was discussed however patient deferred at the time.  On 12/17 patient presented to the ED.  She had been seen at her primary care with a sinus infection, went into SVT and was given 6 mg of adenosine  and converted to sinus tachycardia.  She was also given IV fluids.  She was seen at [facility], ER and was monitored, did not have recurrence of SVT so she was discharged.  Patient presented back to the ED on 12/19 complaining of palpitations.  Initial vital signs in the ED with heart rate of 137 bpm, oxygen 97% on room air, BP 143/128.  EKG showed a regular, wide-complex tachycardia with heart rate 136 bpm, appeared to be SVT with underlying right bundle branch block.  Labs in the ED showed-troponin 20, hemoglobin 16.4, TSH within normal limits.  Chest x-ray showed no acute cardiopulmonary abnormality.  Patient was started on IV diltiazem  with improvement in heart rate.  Today she presents for follow-up.  She reports that she is doing well overall, denies any further chest or jaw pain, denies any palpitations or feeling of SVT.  She denies any shortness of breath, lower extremity edema, orthopnea or PND.  She denies any presyncope or syncope.  Patient reports that when she was in SVT most recently she did have significant burning pain that she thought was related to acid reflux associated with her jaw pain, has not had  recurrence.  She does note that she has significant history of acid reflux. ROS: .   Today she denies chest pain, shortness of breath, lower extremity edema, fatigue, palpitations, melena, hematuria, hemoptysis, diaphoresis, weakness, presyncope, syncope, orthopnea, and PND.  All other systems are reviewed and otherwise negative. Studies Reviewed: Lauren Huffman   EKG:  EKG is ordered today, personally reviewed, demonstrating  EKG Interpretation Date/Time:  Tuesday April 04 2024 13:43:01 EST Ventricular Rate:  85 PR Interval:  174 QRS Duration:  106 QT Interval:  388 QTC Calculation: 461 R Axis:   -51  Text Interpretation: Normal sinus rhythm Possible Left atrial enlargement Incomplete right bundle branch block Left anterior fascicular block Confirmed by Lauren Huffman [id number]) on 04/04/2024 3:12:15 PM   CV Studies: Cardiac studies reviewed are outlined and summarized above. Otherwise please see EMR for full report. Cardiac Studies & Procedures   ______________________________________________________________________________________________     ECHOCARDIOGRAM  ECHOCARDIOGRAM COMPLETE 03/25/2024  Narrative ECHOCARDIOGRAM REPORT    Patient Name:   Lauren Huffman Date of Exam: 03/25/2024 Medical Rec #:  [id number]  Height:       68.0 in Accession #:    [id number] Weight:       264.1 lb Date of Birth:  1955/09/27  BSA:          2.300 m Patient Age:    68 years   BP:           129/77 mmHg Patient Gender: F          HR:           68 bpm. Exam Location:  Inpatient  Procedure: 2D Echo, Cardiac Doppler and Color Doppler (Both Spectral and Color Flow Doppler were utilized during procedure).  Indications:    Atrial Fibrillation  History:        Patient has prior history of Echocardiogram examinations, most recent 11/30/2019. CAD, Arrythmias:Atrial Fibrillation; Risk Factors:Hypertension and Dyslipidemia.  Sonographer:    Aida Pizza RCS Referring Phys: [id number] Lauren Huffman  IMPRESSIONS   1.  Left ventricular ejection fraction, by estimation, is 60 to 65%. The left ventricle has normal function. The left ventricle has no regional wall motion abnormalities. Left ventricular diastolic parameters were normal. 2. Right ventricular systolic function is low normal. The right ventricular size is mildly enlarged. Tricuspid regurgitation signal is inadequate for assessing PA pressure. 3. The mitral valve is normal in structure. No evidence of mitral valve regurgitation. No evidence of mitral stenosis. 4. Tricuspid valve regurgitation is mild to moderate. 5. The aortic valve is calcified. Aortic valve regurgitation is trivial. Aortic valve sclerosis/calcification is present, without any evidence of aortic stenosis. 6. The inferior vena cava is normal in size with greater than 50% respiratory variability, suggesting right atrial pressure of 3 mmHg.  FINDINGS Left Ventricle: Left ventricular ejection fraction, by estimation, is 60 to 65%. The left ventricle has normal function. The left ventricle has no regional wall motion abnormalities. The  left ventricular internal cavity size was normal in size. There is no left ventricular hypertrophy. Left ventricular diastolic parameters were normal.  Right Ventricle: The right ventricular size is mildly enlarged. No increase in right ventricular wall thickness. Right ventricular systolic function is low normal. Tricuspid regurgitation signal is inadequate for assessing PA pressure.  Left Atrium: Left atrial size was normal in size.  Right Atrium: Right atrial size was normal in size.  Pericardium: Trivial pericardial effusion is present. The pericardial effusion is anterior to the right ventricle. Presence of epicardial fat layer.  Mitral Valve: The mitral valve is normal in structure. No evidence of mitral valve regurgitation. No evidence of mitral valve stenosis.  Tricuspid Valve: The tricuspid valve is normal in structure. Tricuspid valve  regurgitation is mild to moderate. No evidence of tricuspid stenosis.  Aortic Valve: The aortic valve is calcified. Aortic valve regurgitation is trivial. Aortic valve sclerosis/calcification is present, without any evidence of aortic stenosis.  Pulmonic Valve: The pulmonic valve was normal in structure. Pulmonic valve regurgitation is not visualized. No evidence of pulmonic stenosis.  Aorta: The aortic root is normal in size and structure.  Venous: The inferior vena cava is normal in size with greater than 50% respiratory variability, suggesting right atrial pressure of 3 mmHg.  IAS/Shunts: No atrial level shunt detected by color flow Doppler.   LEFT VENTRICLE PLAX 2D LVIDd:         4.00 cm   Diastology LVIDs:         2.10 cm   LV e' medial:    9.46 cm/s LV PW:         0.90 cm   LV E/e' medial:  8.9 LV IVS:        1.00 cm   LV e' lateral:   12.40 cm/s LVOT diam:     2.00 cm   LV E/e' lateral: 6.8 LV SV:         78 LV SV Index:   34 LVOT Area:     3.14 cm   RIGHT VENTRICLE RV S prime:     20.50 cm/s TAPSE (M-mode): 2.5 cm  LEFT ATRIUM             Index        RIGHT ATRIUM           Index LA diam:        3.35 cm 1.46 cm/m   RA Area:     21.90 cm LA Vol (A2C):   42.6 ml 18.52 ml/m  RA Volume:   63.40 ml  27.56 ml/m LA Vol (A4C):   92.3 ml 40.13 ml/m LA Biplane Vol: 67.6 ml 29.39 ml/m AORTIC VALVE LVOT Vmax:   126.50 cm/s LVOT Vmean:  90.100 cm/s LVOT VTI:    0.249 m  AORTA Ao Root diam: 3.50 cm  MITRAL VALVE MV Area (PHT): 2.62 cm    SHUNTS MV Decel Time: 289 msec    Systemic VTI:  0.25 m MV E velocity: 84.60 cm/s  Systemic Diam: 2.00 cm MV A velocity: 77.20 cm/s MV E/A ratio:  1.10  Kardie Tobb DO Electronically signed by Dub Huntsman DO Signature Date/Time: 03/25/2024/8:18:16 PM    Final      CT SCANS  CT CORONARY FRACTIONAL FLOW RESERVE DATA PREP 12/05/2019  Narrative EXAM: FFRCT ANALYSIS  FINDINGS: FFRct analysis was performed on the  original cardiac CT angiogram dataset. Diagrammatic representation of the FFRct analysis is provided in a separate PDF document in PACS.  This dictation was created using the PDF document and an interactive 3D model of the results. 3D model is not available in the EMR/PACS. Normal FFR range is >0.80.  1. Left Main: No significant stenosis. LM FFR = 0.99.  2. LAD: No significant stenosis. Proximal FFR = 0.99, Mid FFR = 0.97, Distal FFR = 0.93.  3. LCX: No significant stenosis. Proximal FFR = 0.98, Mid FFR = 0.97, Distal FFR = 0.95  4. OM1: No significant stenosis. Proximal FFR = 0.98, Mid to distal FFR = 0.87.  5. RCA: No significant stenosis. Proximal FFR = 0.98, Mid FFR = 0.95, Distal FFR = 0.90.  IMPRESSION:: IMPRESSION: 1. Coronary CT FFR flow analysis demonstrates possible flow limiting lesion in the diagonal #1 branch (FFR 0.79 mid and 0.76 distal). Normal flow in RCA, LCX and LAD.  2.  Recommend medical management.  Lauren Huffman   Electronically Signed By: Lauren Huffman On: 12/06/2019 12:46   CT SCANS  CT CORONARY MORPH W/CTA COR W/SCORE 12/04/2019  Addendum 12/04/2019  5:41 PM ADDENDUM REPORT: 12/04/2019 17:39  CLINICAL DATA:  54F with SVT, hypertension, hyperlipidemia, untreated OSA, and family history of CAD with exertional dyspnea needing pre-operative clearance.  EXAM: Cardiac/Coronary  CT  TECHNIQUE: The patient was scanned on a [employer].  FINDINGS: A 120 kV prospective scan was triggered in the descending thoracic aorta at 111 HU's. Axial non-contrast 3 mm slices were carried out through the heart. The data set was analyzed on a dedicated work station and scored using the Agatson method. Gantry rotation speed was 250 msecs and collimation was .6 mm. No beta blockade and 0.8 mg of sl NTG was given. The 3D data set was reconstructed in 5% intervals of the 67-82 % of the R-R cycle. Diastolic phases were analyzed on a dedicated work  station using MPR, MIP and VRT modes. The patient received 80 cc of contrast.  Aorta: Normal size. Ascending aorta 3.1 cm. minimal calcification of the aortic root. No dissection.  Aortic Valve:  Trileaflet.  No calcification.  Coronary Arteries:  Normal coronary origin.  Right dominance.  RCA is a large dominant artery that gives rise to PDA and PLVB. There is minimal (<25%) mixed plaque proximally. The PDA and distal RCA are not well-visualized.  Left main is a large artery that gives rise to LAD and LCX arteries.  LAD is a large vessel that has no plaque. There is mild (25-49%) mixed plaque proximally and moderate (50-69%) mixed plaque in the mid LAD. There is severe (>70%) plaque in the distal LAD. There is a large D1 with diffuse, severe (>70%) mixed plaque.  LCX is a non-dominant artery that gives rise to one large OM1 branch. There is no plaque.  Other findings:  Normal pulmonary vein drainage into the left atrium.  Normal let atrial appendage without a thrombus.  Normal size of the pulmonary artery.  IMPRESSION: 1. Coronary calcium  score of 156. This was 85th percentile for age and sex matched control.  2. Normal coronary origin with right dominance.  3. Moderate (50-69%) proximal and severe (>70%) mixed plaque in the LAD and D1. CAD-RADS 4.  4.  Consider cardiac catheterization.  Annabella Scarce, MD   Electronically Signed By: Annabella Scarce On: 12/04/2019 17:39  Narrative EXAM: OVER-READ INTERPRETATION  CT CHEST  The following report is an over-read performed by radiologist Dr. Toribio Aye of [facility] on 12/04/2019. This over-read does not include interpretation of cardiac or coronary anatomy or  pathology. The coronary calcium  score/coronary CTA interpretation by the cardiologist is attached.  COMPARISON:  None.  FINDINGS: Aortic atherosclerosis. Multiple small pulmonary nodules are noted throughout the lungs  bilaterally, largest of which is in the anterior aspect of the right upper lobe (axial image 8 of series 15) measuring 5 x 3 mm (mean diameter 4 mm). Within the visualized portions of the thorax there are no suspicious appearing pulmonary nodules or masses, there is no acute consolidative airspace disease, no pleural effusions, no pneumothorax and no lymphadenopathy. Moderate to large hiatal hernia. Visualized portions of the upper abdomen are unremarkable. There are no aggressive appearing lytic or blastic lesions noted in the visualized portions of the skeleton.  IMPRESSION: 1. Multiple small 2-4 mm pulmonary nodules in the lungs bilaterally, nonspecific, but statistically likely benign. No follow-up needed if patient is low-risk (and has no known or suspected primary neoplasm). Non-contrast chest CT can be considered in 12 months if patient is high-risk. This recommendation follows the consensus statement: Guidelines for Management of Incidental Pulmonary Nodules Detected on CT Images: From the Fleischner Society 2017; Radiology 2017; 284:228-243. 2. Moderate to large hiatal hernia.  Electronically Signed: By: Toribio Aye M.D. On: 12/04/2019 14:58     ______________________________________________________________________________________________       Current Reported Medications:.    Active Medications[1]  Physical Exam:    VS:  BP 114/78   Pulse 85   Ht 5' 8 (1.727 m)   Wt 266 lb 3.2 oz (120.7 kg)   SpO2 94%   BMI 40.48 kg/m    Wt Readings from Last 3 Encounters:  04/04/24 266 lb 3.2 oz (120.7 kg)  03/25/24 264 lb 1.6 oz (119.8 kg)  03/22/24 262 lb (118.8 kg)    GEN: Well nourished, well developed in no acute distress NECK: No JVD; No carotid bruits CARDIAC: RRR, no murmurs, rubs, gallops RESPIRATORY:  Clear to auscultation without rales, wheezing or rhonchi  ABDOMEN: Soft, non-tender, non-distended EXTREMITIES:  No edema; No acute deformity      Asessement and Plan:.    CAD: Coronary CTA on 12/04/2019 indicated coronary calcium  score of 156, 85th percentile for age and sex matched control, moderate 50 to 69% proximal and greater than 70% mixed plaque in the LAD and diagonal.  Show there was obstructive plaque in mid diagonal branch but main epicardial arteries had normal flow, medical management was recommended.  Started on aspirin  81 mg daily, Imdur  30 mg daily and atorvastatin  to 40 mg daily.  Patient recently again admitted with SVT, reported having significant burning chest pain as well as jaw pain with recommendation for outpatient ischemic evaluation.  Today patient reports she has not had any further chest pain or shortness of breath however given history of CAD and significant chest and jaw pain recommend proceeding with cardiac PET stress, patient in agreement with plan.  Reviewed ED precautions.  Informed Consent   Shared Decision Making/Informed Consent The risks [chest pain, shortness of breath, cardiac arrhythmias, dizziness, blood pressure fluctuations, myocardial infarction, stroke/transient ischemic attack, nausea, vomiting, allergic reaction, radiation exposure, metallic taste sensation and life-threatening complications (estimated to be 1 in 10,000)], benefits (risk stratification, diagnosing coronary artery disease, treatment guidance) and alternatives of a cardiac PET stress test were discussed in detail with Ms. Laroche and she agrees to proceed.      SVT: Patient with episode of SVT in 2018 requiring chemical cardioversion with adenosine .  Patient was previously started on metoprolol  and Cardizem  but was intolerant. Patient seen on  12/17 with SVT that broke with adenosine , presented again on 12/19 with palpitations, found to be in regular, wide-complex tachycardia concerning for SVT with underlying right bundle branch block.  started on Dilt heart rate improved and patient was in normal sinus rhythm with very frequent PVCs and  PACs.  No A-fib noted.  Suspected that recent head cold and sinus infection triggered SVT.  Given recent recurrent SVT patient appreciates referral to EP to establish care.  She denies any further palpitations or feelings of SVT, notes that she has diltiazem  as needed at home.  Hyperlipidemia:  Last lipid profile on 05/24/2023 indicated total cholesterol 143, HDL 50, triglycerides 185 and LDL 62.  Continue Lipitor  80 mg daily.   Hypertension: Blood pressure today 114/78.  Continue current antihypertensive regimen.  OSA: Patient notes she is intolerant to CPAP.   Disposition: F/u with Dr. Shlomo in three months pending results of stress test.   Signed, Caylea Foronda D Debbora Ang, NP       [1]  Current Meds  Medication Sig   aspirin  EC 81 MG tablet Take 1 tablet (81 mg total) by mouth daily. Swallow whole.   atorvastatin  (LIPITOR ) 80 MG tablet Take 1 tablet (80 mg total) by mouth daily.   buPROPion  (WELLBUTRIN  XL) 150 MG 24 hr tablet Take 450 mg by mouth daily.   busPIRone (BUSPAR) 10 MG tablet Take 10 mg by mouth daily.   Calcium -Phosphorus-Vitamin D  (CALCIUM  GUMMIES PO) Take 500 mg by mouth in the morning and at bedtime.   citalopram  (CELEXA ) 20 MG tablet Take 30 mg by mouth daily.   dextromethorphan -guaiFENesin  (MUCINEX  DM) 30-600 MG 12hr tablet Take 1 tablet by mouth 2 (two) times daily. (Patient taking differently: Take 1 tablet by mouth 2 (two) times daily as needed for cough.)   diltiazem  (CARDIZEM ) 30 MG tablet Take 1 tablet (30 mg total) by mouth every 6 (six) hours as needed (HR sustaining >110 BPM).   divalproex  (DEPAKOTE  ER) 250 MG 24 hr tablet Take 250-500 mg by mouth in the morning and at bedtime. Take one tablet (250mg ) by mouth in the morning, and take two tablets (500mg ) by mouth at bedtime.   ELDERBERRY PO Take 1 each by mouth at bedtime.   famotidine  (PEPCID ) 10 MG tablet Take 10 mg by mouth daily.   fexofenadine (ALLEGRA ALLERGY) 180 MG tablet Take 180 mg by mouth at bedtime.    folic acid  (FOLVITE ) 1 MG tablet Take 2 mg by mouth daily.    isosorbide  mononitrate (IMDUR ) 30 MG 24 hr tablet Take 1 tablet (30 mg total) by mouth daily.   Loperamide -Simethicone  2-125 MG TABS Take 1 tablet by mouth daily as needed.   Multiple Vitamins-Minerals (MULTIVITAMIN GUMMIES ADULT PO) Take 2 each by mouth at bedtime.   naproxen sodium (ALEVE) 220 MG tablet Take 220 mg by mouth daily as needed (pain).   nystatin powder Apply 1 Application topically 2 (two) times daily as needed (yeast in armpits).   omeprazole (PRILOSEC) 20 MG capsule Take 20 mg by mouth every morning.   ondansetron  (ZOFRAN -ODT) 4 MG disintegrating tablet Take 4 mg by mouth daily as needed for nausea.   Probiotic Product (ALIGN) 10 MG CAPS Take 1 each by mouth every evening.   risperiDONE  (RISPERDAL ) 0.25 MG tablet Take 0.25 mg by mouth at bedtime.   Semaglutide, 2 MG/DOSE, (OZEMPIC, 2 MG/DOSE,) 8 MG/3ML SOPN Inject 2 mg into the skin every Wednesday.   TURMERIC PO Take 1 capsule by mouth at bedtime.   "

## 2024-04-04 ENCOUNTER — Encounter: Payer: Self-pay | Admitting: Cardiology

## 2024-04-04 ENCOUNTER — Ambulatory Visit: Attending: Cardiology | Admitting: Cardiology

## 2024-04-04 VITALS — BP 114/78 | HR 85 | Ht 68.0 in | Wt 266.2 lb

## 2024-04-04 DIAGNOSIS — I2510 Atherosclerotic heart disease of native coronary artery without angina pectoris: Secondary | ICD-10-CM

## 2024-04-04 DIAGNOSIS — E785 Hyperlipidemia, unspecified: Secondary | ICD-10-CM | POA: Diagnosis not present

## 2024-04-04 DIAGNOSIS — G4733 Obstructive sleep apnea (adult) (pediatric): Secondary | ICD-10-CM | POA: Diagnosis not present

## 2024-04-04 DIAGNOSIS — R079 Chest pain, unspecified: Secondary | ICD-10-CM | POA: Diagnosis not present

## 2024-04-04 DIAGNOSIS — I10 Essential (primary) hypertension: Secondary | ICD-10-CM | POA: Diagnosis not present

## 2024-04-04 DIAGNOSIS — I471 Supraventricular tachycardia, unspecified: Secondary | ICD-10-CM

## 2024-04-04 NOTE — Patient Instructions (Signed)
 Medication Instructions:  Your physician recommends that you continue on your current medications as directed. Please refer to the Current Medication list given to you today.  *If you need a refill on your cardiac medications before your next appointment, please call your pharmacy*  Lab Work: NONE If you have labs (blood work) drawn today and your tests are completely normal, you will receive your results only by: MyChart Message (if you have MyChart) OR A paper copy in the mail If you have any lab test that is abnormal or we need to change your treatment, we will call you to review the results.  Testing/Procedures:    Please report to Radiology at the [facility] Main Entrance 30 minutes early for your test.  [address] [place], [place] [address]   How to Prepare for Your Cardiac PET/CT Stress Test:  Nothing to eat or drink, except water , 3 hours prior to arrival time.  NO caffeine /decaffeinated products, or chocolate 12 hours prior to arrival. (Please note decaffeinated beverages (teas/coffees) still contain caffeine ).  If you have caffeine  within 12 hours prior, the test will need to be rescheduled.  Medication instructions: Do not take erectile dysfunction medications for 72 hours prior to test (sildenafil, tadalafil) Do not take nitrates (isosorbide  mononitrate, Ranexa) the day before or day of test Do not take tamsulosin the day before or morning of test Hold theophylline containing medications for 12 hours. Hold Dipyridamole 48 hours prior to the test.  Diabetic Preparation: If able to eat breakfast prior to 3 hour fasting, you may take all medications, including your insulin . Do not worry if you miss your breakfast dose of insulin  - start at your next meal. If you do not eat prior to 3 hour fast-Hold all diabetes (oral and insulin ) medications. Patients who wear a continuous glucose monitor MUST remove the device prior to scanning.  You may take your  remaining medications with water .  NO perfume, cologne or lotion on chest or abdomen area. FEMALES - Please avoid wearing dresses to this appointment.  Total time is 1 to 2 hours; you may want to bring reading material for the waiting time.  IF YOU THINK YOU MAY BE PREGNANT, OR ARE NURSING PLEASE INFORM THE TECHNOLOGIST.  In preparation for your appointment, medication and supplies will be purchased.  Appointment availability is limited, so if you need to cancel or reschedule, please call the Radiology Department Scheduler at [phone] 24 hours in advance to avoid a cancellation fee of $100.00  What to Expect When you Arrive:  Once you arrive and check in for your appointment, you will be taken to a preparation room within the Radiology Department.  A technologist or Nurse will obtain your medical history, verify that you are correctly prepped for the exam, and explain the procedure.  Afterwards, an IV will be started in your arm and electrodes will be placed on your skin for EKG monitoring during the stress portion of the exam. Then you will be escorted to the PET/CT scanner.  There, staff will get you positioned on the scanner and obtain a blood pressure and EKG.  During the exam, you will continue to be connected to the EKG and blood pressure machines.  A small, safe amount of a radioactive tracer will be injected in your IV to obtain a series of pictures of your heart along with an injection of a stress agent.    After your Exam:  It is recommended that you eat a meal  and drink a caffeinated beverage to counter act any effects of the stress agent.  Drink plenty of fluids for the remainder of the day and urinate frequently for the first couple of hours after the exam.  Your doctor will inform you of your test results within 7-10 business days.  For more information and frequently asked questions, please visit our website: [email or web address]  For questions about your test  or how to prepare for your test, please call: Cardiac Imaging Nurse Navigators Office: [phone]   Follow-Up: At [facility], you and your health needs are our priority.  As part of our continuing mission to provide you with exceptional heart care, our providers are all part of one team.  This team includes your primary Cardiologist (physician) and Advanced Practice Providers or APPs (Physician Assistants and Nurse Practitioners) who all work together to provide you with the care you need, when you need it.  Your next appointment:   3 month(s)  Provider:   Wilbert Bihari, MD

## 2024-04-11 ENCOUNTER — Inpatient Hospital Stay: Admission: RE | Admit: 2024-04-11 | Discharge: 2024-04-11 | Attending: Family Medicine | Admitting: Family Medicine

## 2024-04-11 DIAGNOSIS — Z1231 Encounter for screening mammogram for malignant neoplasm of breast: Secondary | ICD-10-CM

## 2024-04-26 ENCOUNTER — Telehealth: Payer: Self-pay | Admitting: Cardiology

## 2024-05-01 ENCOUNTER — Encounter (HOSPITAL_COMMUNITY): Payer: Self-pay

## 2024-05-03 ENCOUNTER — Ambulatory Visit (HOSPITAL_COMMUNITY)
Admission: RE | Admit: 2024-05-03 | Discharge: 2024-05-03 | Disposition: A | Discharge status: Home / Self Care | Source: Ambulatory Visit | Attending: Cardiology | Admitting: Cardiology

## 2024-05-03 DIAGNOSIS — I2510 Atherosclerotic heart disease of native coronary artery without angina pectoris: Secondary | ICD-10-CM | POA: Diagnosis present

## 2024-05-03 DIAGNOSIS — R079 Chest pain, unspecified: Secondary | ICD-10-CM | POA: Diagnosis present

## 2024-05-03 MED ORDER — REGADENOSON 0.4 MG/5ML IV SOLN
INTRAVENOUS | Status: AC
  Filled 2024-05-03: qty 5

## 2024-05-03 MED ORDER — REGADENOSON 0.4 MG/5ML IV SOLN
0.4000 mg | Freq: Once | INTRAVENOUS | Status: AC
  Administered 2024-05-03: 0.4 mg via INTRAVENOUS

## 2024-05-03 MED ORDER — RUBIDIUM RB82 GENERATOR (RUBYFILL)
30.2700 | PACK | Freq: Once | INTRAVENOUS | Status: AC
  Administered 2024-05-03: 30.27 via INTRAVENOUS

## 2024-05-03 MED ORDER — RUBIDIUM RB82 GENERATOR (RUBYFILL)
30.2500 | PACK | Freq: Once | INTRAVENOUS | Status: AC
  Administered 2024-05-03: 30.25 via INTRAVENOUS

## 2024-05-04 LAB — NM PET CT CARDIAC PERFUSION MULTI W/ABSOLUTE BLOODFLOW
MBFR: 2.73
Peak HR: 94 {beats}/min
Rest HR: 86 {beats}/min
Rest MBF: 1.25 ml/g/min
Rest Nuclear Isotope Dose: 30.3 mCi
ST Depression (mm): 0 mm
Stress MBF: 3.41 ml/g/min
Stress Nuclear Isotope Dose: 30.3 mCi

## 2024-05-05 NOTE — Progress Notes (Signed)
 Lauren Huffman                                          MRN: [id number]   05/05/2024   The [facility] Quality Team Specialist reviewed this patient medical record for the purposes of chart review for care gap closure. The following were reviewed: abstraction for care gap closure-glycemic status assessment.    [facility] Quality Team

## 2024-05-08 ENCOUNTER — Ambulatory Visit: Payer: Self-pay | Admitting: Cardiology

## 2024-05-30 ENCOUNTER — Ambulatory Visit: Admitting: Cardiology

## 2024-07-18 ENCOUNTER — Ambulatory Visit: Admitting: Cardiology
